# Patient Record
Sex: Male | Born: 1950
Health system: Southern US, Community
[De-identification: ages and names within clinical notes are randomized; demographics above are authoritative.]

## PROBLEM LIST (undated history)

## (undated) DIAGNOSIS — Z720 Tobacco use: Secondary | ICD-10-CM

## (undated) DIAGNOSIS — I10 Essential (primary) hypertension: Secondary | ICD-10-CM

## (undated) DIAGNOSIS — C801 Malignant (primary) neoplasm, unspecified: Secondary | ICD-10-CM

## (undated) DIAGNOSIS — J45909 Unspecified asthma, uncomplicated: Secondary | ICD-10-CM

## (undated) DIAGNOSIS — J449 Chronic obstructive pulmonary disease, unspecified: Secondary | ICD-10-CM

## (undated) DIAGNOSIS — G4700 Insomnia, unspecified: Secondary | ICD-10-CM

## (undated) DIAGNOSIS — E079 Disorder of thyroid, unspecified: Secondary | ICD-10-CM

## (undated) HISTORY — DX: Tobacco use: Z72.0

## (undated) HISTORY — DX: Insomnia, unspecified: G47.00

## (undated) HISTORY — DX: Essential (primary) hypertension: I10

## (undated) HISTORY — DX: Unspecified asthma, uncomplicated: J45.909

## (undated) HISTORY — PX: OTHER SURGICAL HISTORY: SHX169

---

## 2005-01-18 ENCOUNTER — Emergency Department (HOSPITAL_COMMUNITY): Admission: EM | Admit: 2005-01-18 | Discharge: 2005-01-18 | Payer: Self-pay | Admitting: Emergency Medicine

## 2013-10-11 HISTORY — PX: THYROIDECTOMY: SHX17

## 2015-10-14 DIAGNOSIS — F17200 Nicotine dependence, unspecified, uncomplicated: Secondary | ICD-10-CM | POA: Diagnosis not present

## 2015-10-14 DIAGNOSIS — I10 Essential (primary) hypertension: Secondary | ICD-10-CM | POA: Diagnosis not present

## 2015-10-14 DIAGNOSIS — R609 Edema, unspecified: Secondary | ICD-10-CM | POA: Diagnosis not present

## 2015-10-14 DIAGNOSIS — J309 Allergic rhinitis, unspecified: Secondary | ICD-10-CM | POA: Diagnosis not present

## 2015-11-20 DIAGNOSIS — J309 Allergic rhinitis, unspecified: Secondary | ICD-10-CM | POA: Diagnosis not present

## 2015-11-20 DIAGNOSIS — I10 Essential (primary) hypertension: Secondary | ICD-10-CM | POA: Diagnosis not present

## 2015-11-20 DIAGNOSIS — R609 Edema, unspecified: Secondary | ICD-10-CM | POA: Diagnosis not present

## 2015-11-20 DIAGNOSIS — F17200 Nicotine dependence, unspecified, uncomplicated: Secondary | ICD-10-CM | POA: Diagnosis not present

## 2015-12-22 DIAGNOSIS — E039 Hypothyroidism, unspecified: Secondary | ICD-10-CM | POA: Diagnosis not present

## 2015-12-22 DIAGNOSIS — Z85850 Personal history of malignant neoplasm of thyroid: Secondary | ICD-10-CM | POA: Diagnosis not present

## 2016-01-21 DIAGNOSIS — J309 Allergic rhinitis, unspecified: Secondary | ICD-10-CM | POA: Diagnosis not present

## 2016-02-09 DIAGNOSIS — G4700 Insomnia, unspecified: Secondary | ICD-10-CM | POA: Diagnosis not present

## 2016-02-09 DIAGNOSIS — I10 Essential (primary) hypertension: Secondary | ICD-10-CM | POA: Diagnosis not present

## 2016-02-09 DIAGNOSIS — F17200 Nicotine dependence, unspecified, uncomplicated: Secondary | ICD-10-CM | POA: Diagnosis not present

## 2016-03-24 DIAGNOSIS — E039 Hypothyroidism, unspecified: Secondary | ICD-10-CM | POA: Diagnosis not present

## 2016-03-24 DIAGNOSIS — Z85850 Personal history of malignant neoplasm of thyroid: Secondary | ICD-10-CM | POA: Diagnosis not present

## 2016-04-14 DIAGNOSIS — Z6832 Body mass index (BMI) 32.0-32.9, adult: Secondary | ICD-10-CM | POA: Diagnosis not present

## 2016-04-14 DIAGNOSIS — R079 Chest pain, unspecified: Secondary | ICD-10-CM | POA: Diagnosis not present

## 2016-04-14 DIAGNOSIS — F17200 Nicotine dependence, unspecified, uncomplicated: Secondary | ICD-10-CM | POA: Diagnosis not present

## 2016-04-14 DIAGNOSIS — F329 Major depressive disorder, single episode, unspecified: Secondary | ICD-10-CM | POA: Diagnosis not present

## 2016-04-14 DIAGNOSIS — F419 Anxiety disorder, unspecified: Secondary | ICD-10-CM | POA: Diagnosis not present

## 2016-05-27 DIAGNOSIS — F419 Anxiety disorder, unspecified: Secondary | ICD-10-CM | POA: Diagnosis not present

## 2016-05-27 DIAGNOSIS — Z6832 Body mass index (BMI) 32.0-32.9, adult: Secondary | ICD-10-CM | POA: Diagnosis not present

## 2016-05-27 DIAGNOSIS — R079 Chest pain, unspecified: Secondary | ICD-10-CM | POA: Diagnosis not present

## 2016-05-27 DIAGNOSIS — F329 Major depressive disorder, single episode, unspecified: Secondary | ICD-10-CM | POA: Diagnosis not present

## 2016-05-31 DIAGNOSIS — R5383 Other fatigue: Secondary | ICD-10-CM | POA: Diagnosis not present

## 2016-05-31 DIAGNOSIS — Z7984 Long term (current) use of oral hypoglycemic drugs: Secondary | ICD-10-CM | POA: Diagnosis not present

## 2016-05-31 DIAGNOSIS — R079 Chest pain, unspecified: Secondary | ICD-10-CM | POA: Diagnosis not present

## 2016-05-31 DIAGNOSIS — Z125 Encounter for screening for malignant neoplasm of prostate: Secondary | ICD-10-CM | POA: Diagnosis not present

## 2016-05-31 DIAGNOSIS — J309 Allergic rhinitis, unspecified: Secondary | ICD-10-CM | POA: Diagnosis not present

## 2016-06-21 DIAGNOSIS — Z85850 Personal history of malignant neoplasm of thyroid: Secondary | ICD-10-CM | POA: Diagnosis not present

## 2016-06-21 DIAGNOSIS — E039 Hypothyroidism, unspecified: Secondary | ICD-10-CM | POA: Diagnosis not present

## 2016-06-23 ENCOUNTER — Encounter: Payer: Self-pay | Admitting: *Deleted

## 2016-06-24 ENCOUNTER — Encounter: Payer: Self-pay | Admitting: *Deleted

## 2016-06-24 ENCOUNTER — Other Ambulatory Visit: Payer: Self-pay | Admitting: *Deleted

## 2016-06-24 ENCOUNTER — Encounter: Payer: Self-pay | Admitting: Cardiovascular Disease

## 2016-06-24 ENCOUNTER — Ambulatory Visit (INDEPENDENT_AMBULATORY_CARE_PROVIDER_SITE_OTHER): Payer: PPO | Admitting: Cardiovascular Disease

## 2016-06-24 VITALS — BP 160/83 | HR 83 | Ht 70.5 in | Wt 228.0 lb

## 2016-06-24 DIAGNOSIS — R0989 Other specified symptoms and signs involving the circulatory and respiratory systems: Secondary | ICD-10-CM

## 2016-06-24 DIAGNOSIS — R072 Precordial pain: Secondary | ICD-10-CM | POA: Diagnosis not present

## 2016-06-24 DIAGNOSIS — I10 Essential (primary) hypertension: Secondary | ICD-10-CM | POA: Diagnosis not present

## 2016-06-24 DIAGNOSIS — Z720 Tobacco use: Secondary | ICD-10-CM | POA: Diagnosis not present

## 2016-06-24 MED ORDER — NITROGLYCERIN 0.4 MG SL SUBL
0.4000 mg | SUBLINGUAL_TABLET | SUBLINGUAL | 3 refills | Status: DC | PRN
Start: 2016-06-24 — End: 2018-08-16

## 2016-06-24 MED ORDER — ASPIRIN EC 81 MG PO TBEC: 81.0000 mg | DELAYED_RELEASE_TABLET | Freq: Every day | ORAL | Status: DC

## 2016-06-24 NOTE — Addendum Note (Signed)
Addended by: Laurine Blazer on: 06/24/2016 02:11 PM   Modules accepted: Orders

## 2016-06-24 NOTE — Progress Notes (Signed)
CARDIOLOGY CONSULT NOTE  Patient ID: Jose Burch MRN: [id number] DOB/AGE: 66-Nov-1952 66 y.o.  Admit date: (Not on file) Primary Physician: Andres Shad, MD Referring Physician:   Reason for Consultation: chest pain  HPI: 65 year old male with history of hypertension referred for the evaluation of chest pain. PCP notes describe pain relieved with lorazepam.  He is a former Administrator. He has a history of tobacco abuse. He has been experiencing intermittent chest tightness for the past 2 years. It is made worse with exertion. There is mild associated shortness of breath. He checks his blood pressure at home and said it is elevated when he has chest tightness. He has had episodes of near-syncope in the past. He said by the end of the day "my whole body is swollen including my legs and feet". Denies orthopnea and PND.  ECG performed in the office today which I personally interpreted showed normal sinus rhythm with right axis deviation.  No Known Allergies  Current Outpatient Prescriptions  Medication Sig Dispense Refill  . amLODipine (NORVASC) 10 MG tablet Take 10 mg by mouth daily.    . calcitRIOL (ROCALTROL) 0.5 MCG capsule Take 0.5 mcg by mouth daily.    . furosemide (LASIX) 20 MG tablet Take 20 mg by mouth as needed.    Marland Kitchen KLOR-CON 10 10 MEQ tablet     . levothyroxine (SYNTHROID, LEVOTHROID) 175 MCG tablet Take 175 mcg by mouth daily before breakfast.    . LORazepam (ATIVAN) 2 MG tablet     . traMADol (ULTRAM) 50 MG tablet Take 50 mg by mouth every 6 (six) hours as needed.     No current facility-administered medications for this visit.     Past Medical History:  Diagnosis Date  . Hypertension   . Insomnia   . Tobacco abuse     Past Surgical History:  Procedure Laterality Date  . THYROIDECTOMY  2015    Social History   Social History  . Marital status: Married    Spouse name: N/A  . Number of children: N/A  . Years of education: N/A    Occupational History  . Not on file.   Social History Main Topics  . Smoking status: Current Some Day Smoker    Packs/day: 0.25    Types: Cigarettes  . Smokeless tobacco: Never Used  . Alcohol use No  . Drug use: No  . Sexual activity: No   Other Topics Concern  . Not on file   Social History Narrative  . No narrative on file     No family history of premature CAD in 1st degree relatives.  Prior to Admission medications   Medication Sig Start Date End Date Taking? Authorizing Provider  amitriptyline (ELAVIL) 75 MG tablet Take 75 mg by mouth at bedtime.    Historical Provider, MD  amLODipine (NORVASC) 10 MG tablet Take 10 mg by mouth daily.    Historical Provider, MD  busPIRone (BUSPAR) 10 MG tablet Take 10 mg by mouth 2 (two) times daily.    Historical Provider, MD  escitalopram (LEXAPRO) 10 MG tablet Take 10 mg by mouth daily.    Historical Provider, MD  furosemide (LASIX) 20 MG tablet Take 20 mg by mouth as needed.    Historical Provider, MD  hydrALAZINE (APRESOLINE) 50 MG tablet Take 50 mg by mouth 3 (three) times daily.    Historical Provider, MD  zolpidem (AMBIEN CR) 12.5 MG CR tablet Take 12.5 mg by mouth at  bedtime.    Historical Provider, MD     Review of systems complete and found to be negative unless listed above in HPI     Physical exam Blood pressure (!) 160/83, pulse 83, height 5' 10.5" (1.791 m), weight 228 lb (103.4 kg), SpO2 94 %. General: NAD Neck: No JVD, no thyromegaly or thyroid nodule.  Lungs: Bibasilar crackles. CV: Nondisplaced PMI. Regular rate and rhythm, normal S1/S2, no S3/S4, no murmur.  No peripheral edema.  No carotid bruit.    Abdomen: Soft, nontender, no hepatosplenomegaly, no distention. Obese. Skin: Intact without lesions or rashes.  Neurologic: Alert and oriented x 3.  Psych: Normal affect. Extremities: No clubbing or cyanosis.  HEENT: Normal.   ECG: Most recent ECG reviewed.  Labs:  No results found for: WBC, HGB, HCT,  MCV, PLT No results for input(s): NA, K, CL, CO2, BUN, CREATININE, CALCIUM, PROT, BILITOT, ALKPHOS, ALT, AST, GLUCOSE in the last 168 hours.  Invalid input(s): LABALBU No results found for: CKTOTAL, CKMB, CKMBINDEX, TROPONINI No results found for: CHOL No results found for: HDL No results found for: LDLCALC No results found for: TRIG No results found for: CHOLHDL No results found for: LDLDIRECT       Studies: No results found.  ASSESSMENT AND PLAN:  1. Chest pain: Given CV risk factors, symptoms concerning for ischemic heart disease. I will proceed with a nuclear myocardial perfusion imaging study to evaluate for ischemic heart disease (Lexiscan). I will order a 2-D echocardiogram with Doppler to evaluate cardiac structure, function, and regional wall motion. Will start ASA 81 mg and SL nitro prn.  2. Lung crackles: May be scar from tobacco abuse, but CHF cannot be ruled out. Will obtain chest xray and echocardiogram.  3. HTN: Elevated. Will monitor. May need additional meds.  Dispo: fu 1 month.    Signed: Kate Sable, M.D., F.A.C.C.  06/24/2016, 8:56 AM

## 2016-06-24 NOTE — Patient Instructions (Addendum)
Medication Instructions:   Begin Aspirin 81mg  daily.  Begin Nitroglycerin as needed for severe chest pain only.  Continue all other medications.    Labwork: none  Testing/Procedures:  A chest x-ray takes a picture of the organs and structures inside the chest, including the heart, lungs, and blood vessels. This test can show several things, including, whether the heart is enlarges; whether fluid is building up in the lungs; and whether pacemaker / defibrillator leads are still in place.  Your physician has requested that you have a lexiscan myoview. For further information please visit [email or web address]. Please follow instruction sheet, as given.  Your physician has requested that you have an echocardiogram. Echocardiography is a painless test that uses sound waves to create images of your heart. It provides your doctor with information about the size and shape of your heart and how well your heart's chambers and valves are working. This procedure takes approximately one hour. There are no restrictions for this procedure.  Office will contact with results via phone or letter.    Follow-Up: 1 month  Any Other Special Instructions Will Be Listed Below (If Applicable).  If you need a refill on your cardiac medications before your next appointment, please call your pharmacy.

## 2016-06-24 NOTE — Addendum Note (Signed)
Addended by: Laurine Blazer on: 06/24/2016 09:19 AM   Modules accepted: Orders

## 2016-06-25 ENCOUNTER — Ambulatory Visit (HOSPITAL_COMMUNITY)
Admission: RE | Admit: 2016-06-25 | Discharge: 2016-06-25 | Disposition: A | Discharge status: Home / Self Care | Payer: PPO | Source: Ambulatory Visit | Attending: Cardiovascular Disease | Admitting: Cardiovascular Disease

## 2016-06-25 DIAGNOSIS — R0989 Other specified symptoms and signs involving the circulatory and respiratory systems: Secondary | ICD-10-CM | POA: Insufficient documentation

## 2016-06-25 DIAGNOSIS — R072 Precordial pain: Secondary | ICD-10-CM | POA: Diagnosis not present

## 2016-06-25 DIAGNOSIS — R0789 Other chest pain: Secondary | ICD-10-CM | POA: Diagnosis not present

## 2016-06-29 ENCOUNTER — Encounter (HOSPITAL_COMMUNITY): Payer: Self-pay

## 2016-06-29 ENCOUNTER — Encounter (HOSPITAL_COMMUNITY)
Admission: RE | Admit: 2016-06-29 | Discharge: 2016-06-29 | Disposition: A | Discharge status: Home / Self Care | Payer: PPO | Source: Ambulatory Visit | Attending: Cardiovascular Disease | Admitting: Cardiovascular Disease

## 2016-06-29 ENCOUNTER — Inpatient Hospital Stay (HOSPITAL_COMMUNITY): Admission: RE | Admit: 2016-06-29 | Payer: PPO | Source: Ambulatory Visit

## 2016-06-29 DIAGNOSIS — R072 Precordial pain: Secondary | ICD-10-CM | POA: Insufficient documentation

## 2016-06-29 HISTORY — DX: Malignant (primary) neoplasm, unspecified: C80.1

## 2016-06-29 LAB — NM MYOCAR MULTI W/SPECT W/WALL MOTION / EF
CHL CUP NUCLEAR SDS: 0
CHL CUP NUCLEAR SRS: 3
CHL CUP RESTING HR STRESS: 78 {beats}/min
CSEPPHR: 96 {beats}/min
LV dias vol: 84 mL (ref 62–150)
LV sys vol: 23 mL
RATE: 0.3
SSS: 3
TID: 0.95

## 2016-06-29 MED ORDER — TECHNETIUM TC 99M TETROFOSMIN IV KIT
30.0000 | PACK | Freq: Once | INTRAVENOUS | Status: AC | PRN
  Administered 2016-06-29: 28.5 via INTRAVENOUS

## 2016-06-29 MED ORDER — SODIUM CHLORIDE 0.9% FLUSH
INTRAVENOUS | Status: AC
  Administered 2016-06-29: 10 mL via INTRAVENOUS
  Filled 2016-06-29: qty 10

## 2016-06-29 MED ORDER — TECHNETIUM TC 99M TETROFOSMIN IV KIT
10.0000 | PACK | Freq: Once | INTRAVENOUS | Status: AC | PRN
  Administered 2016-06-29: 10.9 via INTRAVENOUS

## 2016-06-29 MED ORDER — TECHNETIUM TC 99M TETROFOSMIN IV KIT: 30.0000 | PACK | Freq: Once | INTRAVENOUS | Status: DC | PRN

## 2016-06-29 MED ORDER — TECHNETIUM TC 99M TETROFOSMIN IV KIT: 10.0000 | PACK | Freq: Once | INTRAVENOUS | Status: DC | PRN

## 2016-06-29 MED ORDER — REGADENOSON 0.4 MG/5ML IV SOLN
INTRAVENOUS | Status: AC
  Administered 2016-06-29: 0.4 mg via INTRAVENOUS
  Filled 2016-06-29: qty 5

## 2016-06-30 ENCOUNTER — Telehealth: Payer: Self-pay | Admitting: Cardiovascular Disease

## 2016-06-30 NOTE — Telephone Encounter (Signed)
Notes Recorded by Laurine Blazer, LPN on 624THL at 075-GRM AM EDT Patient notified and verbalized understanding. Copy to pmd. Follow up scheduled for 07/23/2016 with Dr. Bronson Ing. ------  Notes Recorded by Herminio Commons, MD on 06/25/2016 at 10:46 AM EDT Normal.

## 2016-06-30 NOTE — Telephone Encounter (Signed)
Jose Burch called wanting to get test results for recent chest xray.

## 2016-07-01 ENCOUNTER — Ambulatory Visit (HOSPITAL_COMMUNITY)
Admission: RE | Admit: 2016-07-01 | Discharge: 2016-07-01 | Disposition: A | Discharge status: Home / Self Care | Payer: PPO | Source: Ambulatory Visit | Attending: Cardiovascular Disease | Admitting: Cardiovascular Disease

## 2016-07-01 DIAGNOSIS — I517 Cardiomegaly: Secondary | ICD-10-CM | POA: Diagnosis not present

## 2016-07-01 DIAGNOSIS — R079 Chest pain, unspecified: Secondary | ICD-10-CM | POA: Diagnosis not present

## 2016-07-01 DIAGNOSIS — R072 Precordial pain: Secondary | ICD-10-CM | POA: Diagnosis not present

## 2016-07-01 DIAGNOSIS — I340 Nonrheumatic mitral (valve) insufficiency: Secondary | ICD-10-CM | POA: Diagnosis not present

## 2016-07-01 NOTE — Progress Notes (Signed)
*  PRELIMINARY RESULTS* Echocardiogram 2D Echocardiogram has been performed.  Jose Burch 07/01/2016, 9:23 AM

## 2016-07-12 DIAGNOSIS — Z23 Encounter for immunization: Secondary | ICD-10-CM | POA: Diagnosis not present

## 2016-07-12 DIAGNOSIS — R0600 Dyspnea, unspecified: Secondary | ICD-10-CM | POA: Diagnosis not present

## 2016-07-12 DIAGNOSIS — Z6839 Body mass index (BMI) 39.0-39.9, adult: Secondary | ICD-10-CM | POA: Diagnosis not present

## 2016-07-12 DIAGNOSIS — R079 Chest pain, unspecified: Secondary | ICD-10-CM | POA: Diagnosis not present

## 2016-07-23 ENCOUNTER — Encounter: Payer: Self-pay | Admitting: Cardiovascular Disease

## 2016-07-23 ENCOUNTER — Ambulatory Visit (INDEPENDENT_AMBULATORY_CARE_PROVIDER_SITE_OTHER): Payer: PPO | Admitting: Cardiovascular Disease

## 2016-07-23 VITALS — BP 149/79 | HR 98 | Ht 70.5 in | Wt 226.2 lb

## 2016-07-23 DIAGNOSIS — R0989 Other specified symptoms and signs involving the circulatory and respiratory systems: Secondary | ICD-10-CM | POA: Diagnosis not present

## 2016-07-23 DIAGNOSIS — I10 Essential (primary) hypertension: Secondary | ICD-10-CM

## 2016-07-23 DIAGNOSIS — Z720 Tobacco use: Secondary | ICD-10-CM

## 2016-07-23 DIAGNOSIS — R072 Precordial pain: Secondary | ICD-10-CM

## 2016-07-23 MED ORDER — VALSARTAN 80 MG PO TABS: 80.0000 mg | ORAL_TABLET | Freq: Every day | ORAL | 6 refills | Status: DC

## 2016-07-23 NOTE — Progress Notes (Signed)
SUBJECTIVE: The patient returns for follow-up after undergoing cardiovascular testing performed for the evaluation of chest pain.  Chest x-ray showed no active pulmonary pulmonary disease.  Echocardiogram showed normal left ventricular systolic function, LVEF 123456, normal regional wall motion, moderate LVH, grade 1 diastolic dysfunction, mild mitral regurgitation.  Nuclear stress test showed no evidence of myocardial ischemia or scar and was deemed a low risk study.  Has had 2 episodes of chest pain relieved with nitroglycerin.   Review of Systems: As per "subjective", otherwise negative.  No Known Allergies  Current Outpatient Prescriptions  Medication Sig Dispense Refill  . amLODipine (NORVASC) 10 MG tablet Take 10 mg by mouth daily.    Marland Kitchen aspirin EC 81 MG tablet Take 1 tablet (81 mg total) by mouth daily.    . calcitRIOL (ROCALTROL) 0.5 MCG capsule Take 0.5 mcg by mouth daily.    . furosemide (LASIX) 40 MG tablet Take 40 mg by mouth daily.    Marland Kitchen KLOR-CON 10 10 MEQ tablet Take 10 mEq by mouth daily.     Marland Kitchen levothyroxine (SYNTHROID, LEVOTHROID) 175 MCG tablet Take 175 mcg by mouth daily before breakfast.    . LORazepam (ATIVAN) 1 MG tablet Take 1 mg by mouth at bedtime.    . nitroGLYCERIN (NITROSTAT) 0.4 MG SL tablet Place 1 tablet (0.4 mg total) under the tongue every 5 (five) minutes as needed for chest pain. 25 tablet 3  . traMADol (ULTRAM) 50 MG tablet Take 50 mg by mouth every 6 (six) hours as needed.     No current facility-administered medications for this visit.     Past Medical History:  Diagnosis Date  . Cancer ([place])    Thyroid  . Hypertension   . Insomnia   . Tobacco abuse     Past Surgical History:  Procedure Laterality Date  . THYROIDECTOMY  2015    Social History   Social History  . Marital status: Married    Spouse name: N/A  . Number of children: N/A  . Years of education: N/A   Occupational History  . Not on file.   Social History Main  Topics  . Smoking status: Current Some Day Smoker    Packs/day: 0.25    Types: Cigarettes  . Smokeless tobacco: Never Used  . Alcohol use No  . Drug use: No  . Sexual activity: No   Other Topics Concern  . Not on file   Social History Narrative  . No narrative on file     Vitals:   07/23/16 1101  BP: (!) 149/79  Pulse: 98  SpO2: 95%  Weight: 226 lb 3.2 oz (102.6 kg)  Height: 5' 10.5" (1.791 m)    PHYSICAL EXAM General: NAD HEENT: Normal. Neck: No JVD, no thyromegaly. Lungs: Clear to auscultation bilaterally with normal respiratory effort. CV: Nondisplaced PMI.  Regular rate and rhythm, normal S1/S2, no S3/S4, no murmur. No pretibial or periankle edema.   Abdomen: Firm, obese. Neurologic: Alert and oriented.  Psych: Normal affect. Skin: Normal. Musculoskeletal: No gross deformities.    ECG: Most recent ECG reviewed.      ASSESSMENT AND PLAN: 1. Chest pain: Symptoms are stable. Will aim to control BP. Continue ASA 81 mg and SL nitro prn. No ischemia or scar on nuclear stress testing. Normal LV systolic function. No further testing indicated.  2. Lung crackles: No evidence of CHF. Likely has atelectasis.  3. HTN: Elevated. Creatinine 0.92 on 06/01/16. Will start valsartan 80 mg  daily.  Dispo: fu 3 months.   Kate Sable, M.D., F.A.C.C.

## 2016-07-23 NOTE — Addendum Note (Signed)
Addended by: Laurine Blazer on: 07/23/2016 11:24 AM   Modules accepted: Orders

## 2016-07-23 NOTE — Patient Instructions (Signed)
Medication Instructions:   Begin Valsartan 80mg  daily.  Continue all other medications.    Labwork: none  Testing/Procedures: none  Follow-Up: 3 months   Any Other Special Instructions Will Be Listed Below (If Applicable).  If you need a refill on your cardiac medications before your next appointment, please call your pharmacy.

## 2016-07-27 DIAGNOSIS — I10 Essential (primary) hypertension: Secondary | ICD-10-CM | POA: Diagnosis not present

## 2016-07-27 DIAGNOSIS — Z131 Encounter for screening for diabetes mellitus: Secondary | ICD-10-CM | POA: Diagnosis not present

## 2016-07-27 DIAGNOSIS — E784 Other hyperlipidemia: Secondary | ICD-10-CM | POA: Diagnosis not present

## 2016-07-27 DIAGNOSIS — E119 Type 2 diabetes mellitus without complications: Secondary | ICD-10-CM | POA: Diagnosis not present

## 2016-07-27 DIAGNOSIS — R609 Edema, unspecified: Secondary | ICD-10-CM | POA: Diagnosis not present

## 2016-09-20 DIAGNOSIS — E039 Hypothyroidism, unspecified: Secondary | ICD-10-CM | POA: Diagnosis not present

## 2016-09-20 DIAGNOSIS — G4730 Sleep apnea, unspecified: Secondary | ICD-10-CM | POA: Diagnosis not present

## 2016-09-22 DIAGNOSIS — E039 Hypothyroidism, unspecified: Secondary | ICD-10-CM | POA: Diagnosis not present

## 2016-10-15 ENCOUNTER — Ambulatory Visit: Payer: PPO | Admitting: Cardiovascular Disease

## 2016-11-16 ENCOUNTER — Encounter: Payer: Self-pay | Admitting: Cardiovascular Disease

## 2016-11-16 ENCOUNTER — Ambulatory Visit (INDEPENDENT_AMBULATORY_CARE_PROVIDER_SITE_OTHER): Payer: PPO | Admitting: Cardiovascular Disease

## 2016-11-16 VITALS — BP 150/78 | HR 75 | Ht 70.5 in | Wt 220.0 lb

## 2016-11-16 DIAGNOSIS — Z720 Tobacco use: Secondary | ICD-10-CM

## 2016-11-16 DIAGNOSIS — I10 Essential (primary) hypertension: Secondary | ICD-10-CM | POA: Diagnosis not present

## 2016-11-16 DIAGNOSIS — R072 Precordial pain: Secondary | ICD-10-CM

## 2016-11-16 NOTE — Patient Instructions (Signed)

## 2016-11-16 NOTE — Progress Notes (Signed)
SUBJECTIVE: The patient presents for follow-up of chest pain.  Echocardiogram in 06/2016 showed normal left ventricular systolic function, LVEF 123456, normal regional wall motion, moderate LVH, grade 1 diastolic dysfunction, mild mitral regurgitation.  Nuclear stress test in 06/2016 showed no evidence of myocardial ischemia or scar and was deemed a low risk study.  He has had one episode of chest pain since his last visit with me requiring nitroglycerin. He has cut back from smoking 2 packs of cigarettes daily to one pack per week. He has had an upper respiratory infection for the past 10 days with a cough and chills. He has had occasional left-sided neck pressure. He is scheduled to see his PCP on Feb 12.   Review of Systems: As per "subjective", otherwise negative.  No Known Allergies  Current Outpatient Prescriptions  Medication Sig Dispense Refill  . amLODipine (NORVASC) 10 MG tablet Take 10 mg by mouth daily.    Marland Kitchen aspirin EC 81 MG tablet Take 1 tablet (81 mg total) by mouth daily.    . calcitRIOL (ROCALTROL) 0.5 MCG capsule Take 0.5 mcg by mouth daily.    . furosemide (LASIX) 40 MG tablet Take 40 mg by mouth daily.    Marland Kitchen KLOR-CON 10 10 MEQ tablet Take 10 mEq by mouth daily.     Marland Kitchen levothyroxine (SYNTHROID, LEVOTHROID) 175 MCG tablet Take 175 mcg by mouth daily before breakfast.    . LORazepam (ATIVAN) 1 MG tablet Take 1 mg by mouth at bedtime.    . nitroGLYCERIN (NITROSTAT) 0.4 MG SL tablet Place 1 tablet (0.4 mg total) under the tongue every 5 (five) minutes as needed for chest pain. 25 tablet 3  . traMADol (ULTRAM) 50 MG tablet Take 50 mg by mouth every 6 (six) hours as needed.    . valsartan (DIOVAN) 80 MG tablet Take 1 tablet (80 mg total) by mouth daily. 30 tablet 6   No current facility-administered medications for this visit.     Past Medical History:  Diagnosis Date  . Cancer ([place])    Thyroid  . Hypertension   . Insomnia   . Tobacco abuse     Past Surgical  History:  Procedure Laterality Date  . THYROIDECTOMY  2015    Social History   Social History  . Marital status: Married    Spouse name: N/A  . Number of children: N/A  . Years of education: N/A   Occupational History  . Not on file.   Social History Main Topics  . Smoking status: Current Some Day Smoker    Packs/day: 0.25    Types: Cigarettes  . Smokeless tobacco: Never Used  . Alcohol use No  . Drug use: No  . Sexual activity: No   Other Topics Concern  . Not on file   Social History Narrative  . No narrative on file     Vitals:   11/16/16 1258  BP: (!) 150/78  Pulse: 75  SpO2: 95%  Weight: 220 lb (99.8 kg)  Height: 5' 10.5" (1.791 m)    PHYSICAL EXAM General: NAD, wearing a respiratory mask. HEENT: Normal. Neck: No JVD, no thyromegaly. Lungs: Clear to auscultation bilaterally with normal respiratory effort. CV: Nondisplaced PMI.  Regular rate and rhythm, normal S1/S2, no S3/S4, no murmur. No pretibial or periankle edema.  .   Abdomen: Soft, nontender, no distention.  Neurologic: Alert and oriented.  Psych: Normal affect. Skin: Normal. Musculoskeletal: No gross deformities.    ECG: Most recent  ECG reviewed.      ASSESSMENT AND PLAN: 1. Chest pain: Symptoms are stable. Will aim to control BP. Continue ASA 81 mg and SL nitro prn. No ischemia or scar on nuclear stress testing. Normal LV systolic function. No further testing indicated.  2. Lung crackles: No evidence of CHF. Likely has atelectasis.  3. HTN: Elevated today but he is struggling with an upper respiratory infection. If elevated at next visit, will increase valsartan to 160 mg daily.  Dispo: fu 6 months.   Kate Sable, M.D., F.A.C.C.

## 2016-11-22 DIAGNOSIS — Z6831 Body mass index (BMI) 31.0-31.9, adult: Secondary | ICD-10-CM | POA: Diagnosis not present

## 2016-11-22 DIAGNOSIS — R05 Cough: Secondary | ICD-10-CM | POA: Diagnosis not present

## 2016-12-02 ENCOUNTER — Ambulatory Visit (INDEPENDENT_AMBULATORY_CARE_PROVIDER_SITE_OTHER): Payer: PPO | Admitting: Otolaryngology

## 2016-12-02 DIAGNOSIS — Z85850 Personal history of malignant neoplasm of thyroid: Secondary | ICD-10-CM

## 2016-12-02 DIAGNOSIS — E039 Hypothyroidism, unspecified: Secondary | ICD-10-CM | POA: Diagnosis not present

## 2016-12-08 ENCOUNTER — Other Ambulatory Visit: Payer: Self-pay | Admitting: *Deleted

## 2016-12-08 MED ORDER — VALSARTAN 80 MG PO TABS: 80.0000 mg | ORAL_TABLET | Freq: Every day | ORAL | 3 refills | Status: DC

## 2017-01-12 DIAGNOSIS — Z79899 Other long term (current) drug therapy: Secondary | ICD-10-CM | POA: Diagnosis not present

## 2017-01-13 ENCOUNTER — Encounter: Payer: Self-pay | Admitting: "Endocrinology

## 2017-01-13 ENCOUNTER — Ambulatory Visit (INDEPENDENT_AMBULATORY_CARE_PROVIDER_SITE_OTHER): Payer: PPO | Admitting: "Endocrinology

## 2017-01-13 VITALS — BP 139/82 | HR 93 | Ht 70.5 in | Wt 215.0 lb

## 2017-01-13 DIAGNOSIS — C73 Malignant neoplasm of thyroid gland: Secondary | ICD-10-CM | POA: Diagnosis not present

## 2017-01-13 DIAGNOSIS — E890 Postprocedural hypothyroidism: Secondary | ICD-10-CM | POA: Diagnosis not present

## 2017-01-13 LAB — T4, FREE: Free T4: 1.7 ng/dL (ref 0.8–1.8)

## 2017-01-13 LAB — T3, FREE: T3, Free: 3.3 pg/mL (ref 2.3–4.2)

## 2017-01-13 LAB — TSH: TSH: 0.38 m[IU]/L — AB (ref 0.40–4.50)

## 2017-01-13 NOTE — Progress Notes (Signed)
Subjective:    Patient ID: Jose Burch, male    DOB: [birth year], PCP Andres Shad, MD   Past Medical History:  Diagnosis Date  . Cancer ([place])    Thyroid  . Hypertension   . Insomnia   . Tobacco abuse    Past Surgical History:  Procedure Laterality Date  . THYROIDECTOMY  2015   Social History   Social History  . Marital status: Married    Spouse name: N/A  . Number of children: N/A  . Years of education: N/A   Social History Main Topics  . Smoking status: Current Some Day Smoker    Packs/day: 0.25    Types: Cigarettes  . Smokeless tobacco: Never Used  . Alcohol use No  . Drug use: No  . Sexual activity: No   Other Topics Concern  . None   Social History Narrative  . None   Outpatient Encounter Prescriptions as of 01/13/2017  Medication Sig  . amLODipine (NORVASC) 10 MG tablet Take 10 mg by mouth daily.  Marland Kitchen aspirin EC 81 MG tablet Take 1 tablet (81 mg total) by mouth daily.  . calcitRIOL (ROCALTROL) 0.5 MCG capsule Take 0.5 mcg by mouth daily.  . furosemide (LASIX) 40 MG tablet Take 40 mg by mouth daily.  Marland Kitchen KLOR-CON 10 10 MEQ tablet Take 10 mEq by mouth daily.   Marland Kitchen levothyroxine (SYNTHROID, LEVOTHROID) 175 MCG tablet Take 175 mcg by mouth daily before breakfast.  . LORazepam (ATIVAN) 1 MG tablet Take 1 mg by mouth at bedtime.  . nitroGLYCERIN (NITROSTAT) 0.4 MG SL tablet Place 1 tablet (0.4 mg total) under the tongue every 5 (five) minutes as needed for chest pain.  . traMADol (ULTRAM) 50 MG tablet Take 50 mg by mouth every 6 (six) hours as needed.  . valsartan (DIOVAN) 80 MG tablet Take 1 tablet (80 mg total) by mouth daily.   No facility-administered encounter medications on file as of 01/13/2017.    ALLERGIES: No Known Allergies  VACCINATION STATUS:  There is no immunization history on file for this patient.  HPI 67 year old man with medical history as above. He is being seen in consultation for history of papillary thyroid cancer status post  total thyroidectomy in 02/09/2014 followed by remnant ablation with I-131. -Records of his treatment are not available to review today. He was recently referred to Dr. Benjamine Mola given this history. Dr. Benjamine Mola suggested that he follows with endocrinologist in town. He does not have recent thyroid function tests. He is on levothyroxine 175 g by mouth every morning. He is compliant with this medication. Per his recollection, he did not have any further surveillance imaging studies after his initial treatment for thyroid cancer. -He denies dysphagia, shortness of breath, voice change. He is on calcitriol 0.5 g 3 times a day since the time of his surgery. He reports that surgery was relatively difficult taking  6 hours to complete. He denies family history of thyroid cancer nor any thyroid dysfunction. He is a chronic active smoker for the last 15 years. He has hypertension on treatment. -He complains of fatigue, intermittent wheezing.  Review of Systems  Constitutional: Steady body weight , + fatigue, no subjective hyperthermia, no subjective hypothermia Eyes: no blurry vision, no xerophthalmia ENT: no sore throat, no nodules palpated in throat, no dysphagia/odynophagia, no hoarseness Cardiovascular: no Chest Pain, no Shortness of Breath, no palpitations, no leg swelling Respiratory: + cough, no SOB Gastrointestinal: no Nausea/Vomiting/Diarhhea Musculoskeletal: no muscle/joint aches Skin: no rashes  Neurological: no tremors, no numbness, no tingling, no dizziness Psychiatric: no depression, no anxiety  Objective:    BP 139/82   Pulse 93   Ht 5' 10.5" (1.791 m)   Wt 215 lb (97.5 kg)   BMI 30.41 kg/m   Wt Readings from Last 3 Encounters:  01/13/17 215 lb (97.5 kg)  11/16/16 220 lb (99.8 kg)  07/23/16 226 lb 3.2 oz (102.6 kg)    Physical Exam  Constitutional: Significantly over weight for hight, not in acute distress, normal state of mind Eyes: PERRLA, EOMI, no exophthalmos ENT: moist mucous  membranes, + long horizontal lower neck surgical scar from prior total thyroidectomy/neck dissection , no cervical lymphadenopathy Cardiovascular: normal precordial activity, Regular Rate and Rhythm, no Murmur/Rubs/Gallops Respiratory:  + Scattered wheezes on all lung fields,  no gross chest deformity. Gastrointestinal: abdomen soft, Non -tender, No distension, Bowel Sounds present Musculoskeletal: no gross deformities, strength intact in all four extremities Skin: moist, warm, no rashes Neurological: no tremor with outstretched hands, Deep tendon reflexes normal in all four extremities.  Last thyroid function test was from 06/01/2016 when TSH was 0.709    Assessment & Plan:   1. Malignant neoplasm of thyroid gland ([place]) 2. Postsurgical hypothyroidism  -Patient is being seen at the kind request of Dr. Teryl Lucy. He does not have his complete records to review today. He is a patient with papillary thyroid cancer status post total thyroidectomy in May 2015 followed by what appears to be remnant ablation with I-131. To his recollection, he did not have any further surveillance imaging of the thyroid bed/neck. - I will proceed to request his records to review. He may need Thyrogen stimulated or body scan if this was not done in the recent past. -I will also send him to lab today for new set of thyroid function test to see if he needs dose adjustment on his levothyroxine. In the meantime I have advised him to remain on levothyroxine 175 g by mouth every morning.  - We discussed about correct intake of levothyroxine, at fasting, with water, separated by at least 30 minutes from breakfast, and separated by more than 4 hours from calcium, iron, multivitamins, acid reflux medications (PPIs). -Patient is made aware of the fact that thyroid hormone replacement is needed for life, dose to be adjusted by periodic monitoring of thyroid function tests.  His non-specific symptoms including fatigue associated  with clinical finding overeating in a patient who smoked for 50+ years could be a sign of COPD. I have advised him to consider smoking cessation and likely require at least chest x-ray for workup. I deferred this to his primary medical doctor.  - He will return in 1 week to review his thyroid records and new thyroid function tests.  - I advised patient to maintain close follow up with Andres Shad, MD for primary care needs. Follow up plan: Return in about 1 week (around 01/20/2017) for labs today, records from his surgeon.  Glade Lloyd, MD Phone: [phone]  Fax: [phone]   01/13/2017, 8:49 AM

## 2017-01-20 ENCOUNTER — Encounter: Payer: Self-pay | Admitting: "Endocrinology

## 2017-01-20 ENCOUNTER — Ambulatory Visit (INDEPENDENT_AMBULATORY_CARE_PROVIDER_SITE_OTHER): Payer: PPO | Admitting: "Endocrinology

## 2017-01-20 VITALS — BP 144/78 | HR 85 | Ht 70.5 in | Wt 219.0 lb

## 2017-01-20 DIAGNOSIS — E890 Postprocedural hypothyroidism: Secondary | ICD-10-CM

## 2017-01-20 DIAGNOSIS — C73 Malignant neoplasm of thyroid gland: Secondary | ICD-10-CM | POA: Diagnosis not present

## 2017-01-20 NOTE — Progress Notes (Signed)
Subjective:    Patient ID: Jose Burch, male    DOB: 1950/12/14, PCP Andres Shad, MD   Past Medical History:  Diagnosis Date  . Cancer ([place])    Thyroid  . Hypertension   . Insomnia   . Tobacco abuse    Past Surgical History:  Procedure Laterality Date  . THYROIDECTOMY  2015   Social History   Social History  . Marital status: Married    Spouse name: N/A  . Number of children: N/A  . Years of education: N/A   Social History Main Topics  . Smoking status: Current Some Day Smoker    Packs/day: 0.25    Types: Cigarettes  . Smokeless tobacco: Never Used  . Alcohol use No  . Drug use: No  . Sexual activity: No   Other Topics Concern  . None   Social History Narrative  . None   Outpatient Encounter Prescriptions as of 01/20/2017  Medication Sig  . amLODipine (NORVASC) 10 MG tablet Take 10 mg by mouth daily.  Marland Kitchen aspirin EC 81 MG tablet Take 1 tablet (81 mg total) by mouth daily.  . calcitRIOL (ROCALTROL) 0.5 MCG capsule Take 0.5 mcg by mouth daily.  . furosemide (LASIX) 40 MG tablet Take 40 mg by mouth daily.  Marland Kitchen KLOR-CON 10 10 MEQ tablet Take 10 mEq by mouth daily.   Marland Kitchen levothyroxine (SYNTHROID, LEVOTHROID) 175 MCG tablet Take 175 mcg by mouth daily before breakfast.  . LORazepam (ATIVAN) 1 MG tablet Take 1 mg by mouth at bedtime.  . nitroGLYCERIN (NITROSTAT) 0.4 MG SL tablet Place 1 tablet (0.4 mg total) under the tongue every 5 (five) minutes as needed for chest pain.  . traMADol (ULTRAM) 50 MG tablet Take 50 mg by mouth every 6 (six) hours as needed.  . valsartan (DIOVAN) 80 MG tablet Take 1 tablet (80 mg total) by mouth daily.   No facility-administered encounter medications on file as of 01/20/2017.    ALLERGIES: No Known Allergies  VACCINATION STATUS:  There is no immunization history on file for this patient.  HPI 66 year old man with medical history as above. He is being seen in Follow-up for history of papillary thyroid cancer .  - We  received some records from his surgeon. He is status post total thyroidectomy in 02/19/2014 followed by remnant ablation with I-131 On 03/22/2014, post therapy scan on 04/01/2014 showing 2 foci of indeterminate activity identified in the suspected neck region. He did not have any subsequent imaging.  He is on levothyroxine 175 g by mouth every morning. He is compliant with this medication. Per his recollection, he did not have any further surveillance imaging studies after his initial treatment for thyroid cancer. -He denies dysphagia, shortness of breath, voice change. He is on calcitriol 0.5 g 3 times a day since the time of his surgery. He reports that surgery was relatively difficult taking  6 hours to complete. He denies family history of thyroid cancer nor any thyroid dysfunction. He is a chronic active smoker for the last 15 years. He has hypertension on treatment. -He complains of fatigue, intermittent wheezing.  Review of Systems  Constitutional: Steady body weight , + fatigue, no subjective hyperthermia, no subjective hypothermia Eyes: no blurry vision, no xerophthalmia ENT: no sore throat, no nodules palpated in throat, no dysphagia/odynophagia, no hoarseness Cardiovascular: no Chest Pain, no Shortness of Breath, no palpitations, no leg swelling Respiratory: + cough, no SOB Gastrointestinal: no Nausea/Vomiting/Diarhhea Musculoskeletal: no muscle/joint aches Skin: no  rashes Neurological: no tremors, no numbness, no tingling, no dizziness Psychiatric: no depression, no anxiety  Objective:    BP (!) 144/78   Pulse 85   Ht 5' 10.5" (1.791 m)   Wt 219 lb (99.3 kg)   BMI 30.98 kg/m   Wt Readings from Last 3 Encounters:  01/20/17 219 lb (99.3 kg)  01/13/17 215 lb (97.5 kg)  11/16/16 220 lb (99.8 kg)    Physical Exam  Constitutional: Significantly over weight for hight, not in acute distress, normal state of mind Eyes: PERRLA, EOMI, no exophthalmos ENT: moist mucous  membranes, + long horizontal lower neck surgical scar from prior total thyroidectomy/neck dissection , no cervical lymphadenopathy Cardiovascular: normal precordial activity, Regular Rate and Rhythm, no Murmur/Rubs/Gallops Respiratory:  + Scattered wheezes on all lung fields,  no gross chest deformity. Gastrointestinal: abdomen soft, Non -tender, No distension, Bowel Sounds present Musculoskeletal: no gross deformities, strength intact in all four extremities Skin: moist, warm, no rashes Neurological: no tremor with outstretched hands, Deep tendon reflexes normal in all four extremities.    Recent Results (from the past 2160 hour(s))  TSH     Status: Abnormal   Collection Time: 01/13/17  9:09 AM  Result Value Ref Range   TSH 0.38 (L) 0.40 - 4.50 mIU/L  T4, free     Status: None   Collection Time: 01/13/17  9:09 AM  Result Value Ref Range   Free T4 1.7 0.8 - 1.8 ng/dL  T3, free     Status: None   Collection Time: 01/13/17  9:09 AM  Result Value Ref Range   T3, Free 3.3 2.3 - 4.2 pg/mL     Assessment & Plan:   1. Malignant neoplasm of thyroid gland ([place]) 2. Postsurgical hypothyroidism  - We have obtained some of his records from his ENT surgeon. He underwent total thyroidectomy on 02/19/2014 followed by thyroid remnant ablation with I-131 on 03/22/2014, was therapy whole-body scan on 04/01/2014 showed 2 foci of indeterminate activity identified in the neck region. No subsequent imaging studies for him. - I discussed and planned Thyrogen stimulated whole-body scan of the surveillance study and he agrees.  -  His repeat thyroid function test show appropriate thyroid hormone replacement with slightly suppressed TSH and free T4 high normal at 1.7. -  I have advised him to remain on levothyroxine 175 g by mouth every morning.  - We discussed about correct intake of levothyroxine, at fasting, with water, separated by at least 30 minutes from breakfast, and separated by more than 4 hours  from calcium, iron, multivitamins, acid reflux medications (PPIs). -Patient is made aware of the fact that thyroid hormone replacement is needed for life, dose to be adjusted by periodic monitoring of thyroid function tests.  His non-specific symptoms including fatigue associated with clinical finding  in a patient who smoked for 50+ years could be a sign of COPD. I have advised him to consider smoking cessation and likely require at least chest x-ray for workup. I deferred this to his primary medical doctor.  - He will return in 2 weeks to review his whole-body scan.   - I advised patient to maintain close follow up with Andres Shad, MD for primary care needs. Follow up plan: Return in about 2 weeks (around 02/03/2017) for Whole Body Scan w/Thyrogen.  Glade Lloyd, MD Phone: [phone]  Fax: [phone]   01/20/2017, 4:53 PM

## 2017-01-31 ENCOUNTER — Encounter (HOSPITAL_COMMUNITY): Payer: Self-pay

## 2017-01-31 ENCOUNTER — Encounter (HOSPITAL_COMMUNITY)
Admission: RE | Admit: 2017-01-31 | Discharge: 2017-01-31 | Disposition: A | Discharge status: Home / Self Care | Payer: PPO | Source: Ambulatory Visit | Attending: "Endocrinology | Admitting: "Endocrinology

## 2017-01-31 DIAGNOSIS — C73 Malignant neoplasm of thyroid gland: Secondary | ICD-10-CM | POA: Diagnosis not present

## 2017-01-31 MED ORDER — THYROTROPIN ALFA 1.1 MG IM SOLR
0.9000 mg | INTRAMUSCULAR | Status: AC
  Administered 2017-01-31: 0.9 mg via INTRAMUSCULAR

## 2017-01-31 MED ORDER — THYROTROPIN ALFA 1.1 MG IM SOLR
INTRAMUSCULAR | Status: AC
  Administered 2017-01-31: 0.9 mg via INTRAMUSCULAR
  Filled 2017-01-31: qty 0.9

## 2017-01-31 MED ORDER — STERILE WATER FOR INJECTION IJ SOLN
INTRAMUSCULAR | Status: AC
  Filled 2017-01-31: qty 10

## 2017-02-01 ENCOUNTER — Encounter (HOSPITAL_COMMUNITY)
Admission: RE | Admit: 2017-02-01 | Discharge: 2017-02-01 | Disposition: A | Discharge status: Home / Self Care | Payer: PPO | Source: Ambulatory Visit | Attending: "Endocrinology | Admitting: "Endocrinology

## 2017-02-01 DIAGNOSIS — C73 Malignant neoplasm of thyroid gland: Secondary | ICD-10-CM | POA: Diagnosis not present

## 2017-02-01 MED ORDER — THYROTROPIN ALFA 1.1 MG IM SOLR
0.9000 mg | INTRAMUSCULAR | Status: AC
Start: 2017-02-01 — End: 2017-02-01
  Administered 2017-02-01: 0.9 mg via INTRAMUSCULAR

## 2017-02-01 MED ORDER — THYROTROPIN ALFA 1.1 MG IM SOLR
INTRAMUSCULAR | Status: AC
  Administered 2017-02-01: 0.9 mg via INTRAMUSCULAR
  Filled 2017-02-01: qty 0.9

## 2017-02-02 ENCOUNTER — Other Ambulatory Visit (HOSPITAL_COMMUNITY)
Admission: RE | Admit: 2017-02-02 | Discharge: 2017-02-02 | Disposition: A | Discharge status: Home / Self Care | Payer: PPO | Source: Ambulatory Visit | Attending: "Endocrinology | Admitting: "Endocrinology

## 2017-02-02 ENCOUNTER — Encounter (HOSPITAL_COMMUNITY)
Admission: RE | Admit: 2017-02-02 | Discharge: 2017-02-02 | Disposition: A | Discharge status: Home / Self Care | Payer: PPO | Source: Ambulatory Visit | Attending: "Endocrinology | Admitting: "Endocrinology

## 2017-02-02 ENCOUNTER — Other Ambulatory Visit: Payer: Self-pay | Admitting: "Endocrinology

## 2017-02-02 DIAGNOSIS — C73 Malignant neoplasm of thyroid gland: Secondary | ICD-10-CM | POA: Insufficient documentation

## 2017-02-02 MED ORDER — SODIUM IODIDE I 131 CAPSULE
4.0000 | Freq: Once | INTRAVENOUS | Status: AC | PRN
  Administered 2017-02-02: 4 via ORAL

## 2017-02-03 LAB — THYROGLOBULIN ANTIBODY: Thyroglobulin Antibody: <1 IU/mL (ref 0.0–0.9)

## 2017-02-04 ENCOUNTER — Encounter (HOSPITAL_COMMUNITY): Payer: Self-pay

## 2017-02-04 ENCOUNTER — Encounter (HOSPITAL_COMMUNITY)
Admission: RE | Admit: 2017-02-04 | Discharge: 2017-02-04 | Disposition: A | Discharge status: Home / Self Care | Payer: PPO | Source: Ambulatory Visit | Attending: "Endocrinology | Admitting: "Endocrinology

## 2017-02-04 DIAGNOSIS — C73 Malignant neoplasm of thyroid gland: Secondary | ICD-10-CM | POA: Diagnosis not present

## 2017-02-04 DIAGNOSIS — Z85850 Personal history of malignant neoplasm of thyroid: Secondary | ICD-10-CM | POA: Diagnosis not present

## 2017-02-07 LAB — THYROGLOBULIN LEVEL: Thyroglobulin: <2 ng/mL

## 2017-02-10 ENCOUNTER — Encounter: Payer: Self-pay | Admitting: "Endocrinology

## 2017-02-10 ENCOUNTER — Ambulatory Visit (INDEPENDENT_AMBULATORY_CARE_PROVIDER_SITE_OTHER): Payer: PPO | Admitting: "Endocrinology

## 2017-02-10 VITALS — BP 153/79 | HR 73 | Ht 70.5 in | Wt 217.0 lb

## 2017-02-10 DIAGNOSIS — C73 Malignant neoplasm of thyroid gland: Secondary | ICD-10-CM | POA: Diagnosis not present

## 2017-02-10 DIAGNOSIS — E890 Postprocedural hypothyroidism: Secondary | ICD-10-CM | POA: Diagnosis not present

## 2017-02-10 NOTE — Progress Notes (Signed)
Subjective:    Patient ID: Jose Burch, male    DOB: 01-Dec-1950, PCP Andres Shad, MD   Past Medical History:  Diagnosis Date  . Cancer ([place])    Thyroid  . Hypertension   . Insomnia   . Tobacco abuse    Past Surgical History:  Procedure Laterality Date  . THYROIDECTOMY  2015   Social History   Social History  . Marital status: Married    Spouse name: N/A  . Number of children: N/A  . Years of education: N/A   Social History Main Topics  . Smoking status: Current Some Day Smoker    Packs/day: 0.25    Types: Cigarettes  . Smokeless tobacco: Never Used  . Alcohol use No  . Drug use: No  . Sexual activity: No   Other Topics Concern  . None   Social History Narrative  . None   Outpatient Encounter Prescriptions as of 02/10/2017  Medication Sig  . amLODipine (NORVASC) 10 MG tablet Take 10 mg by mouth daily.  Marland Kitchen aspirin EC 81 MG tablet Take 1 tablet (81 mg total) by mouth daily.  . calcitRIOL (ROCALTROL) 0.5 MCG capsule Take 0.5 mcg by mouth daily.  Marland Kitchen levothyroxine (SYNTHROID, LEVOTHROID) 175 MCG tablet Take 175 mcg by mouth daily before breakfast.  . LORazepam (ATIVAN) 1 MG tablet Take 1 mg by mouth at bedtime.  . nitroGLYCERIN (NITROSTAT) 0.4 MG SL tablet Place 1 tablet (0.4 mg total) under the tongue every 5 (five) minutes as needed for chest pain.  . traMADol (ULTRAM) 50 MG tablet Take 50 mg by mouth every 6 (six) hours as needed.  . valsartan (DIOVAN) 80 MG tablet Take 1 tablet (80 mg total) by mouth daily.  . [DISCONTINUED] furosemide (LASIX) 40 MG tablet Take 40 mg by mouth daily.  . [DISCONTINUED] KLOR-CON 10 10 MEQ tablet Take 10 mEq by mouth daily.    No facility-administered encounter medications on file as of 02/10/2017.    ALLERGIES: No Known Allergies  VACCINATION STATUS:  There is no immunization history on file for this patient.  HPI 66 year old man with medical history as above. He is being seen in Follow-up for history of papillary  thyroid cancer .  - We received some records from his surgeon. He is status post total thyroidectomy in 02/19/2014 followed by remnant ablation with I-131 On 03/22/2014, post therapy scan on 04/01/2014 showing 2 foci of indeterminate activity identified in the suspected neck region. He did not have any subsequent imaging.  He is on levothyroxine 175 g by mouth every morning. He is compliant with this medication. - He underwent Thyrogen stimulated whole-body scan which was negative for metastatic thyroid cancer- on February 04, 2017. -He denies dysphagia, shortness of breath, voice change. He is on calcitriol 0.5 g 3 times a day since the time of his surgery. He reports that surgery was relatively difficult taking  6 hours to complete. He denies family history of thyroid cancer nor any thyroid dysfunction. He is a chronic active smoker for the last 15 years. He has hypertension on treatment. -He complains of fatigue, intermittent wheezing.  Review of Systems  Constitutional: Steady body weight , + fatigue, no subjective hyperthermia, no subjective hypothermia Eyes: no blurry vision, no xerophthalmia ENT: no sore throat, no nodules palpated in throat, no dysphagia/odynophagia, no hoarseness Cardiovascular: no Chest Pain, no Shortness of Breath, no palpitations, no leg swelling Respiratory: + cough, no SOB Gastrointestinal: no Nausea/Vomiting/Diarhhea Musculoskeletal: no muscle/joint aches Skin:  no rashes Neurological: no tremors, no numbness, no tingling, no dizziness Psychiatric: no depression, no anxiety  Objective:    BP (!) 153/79   Pulse 73   Ht 5' 10.5" (1.791 m)   Wt 217 lb (98.4 kg)   BMI 30.70 kg/m   Wt Readings from Last 3 Encounters:  02/10/17 217 lb (98.4 kg)  01/20/17 219 lb (99.3 kg)  01/13/17 215 lb (97.5 kg)    Physical Exam  Constitutional: Significantly over weight for hight, not in acute distress, normal state of mind Eyes: PERRLA, EOMI, no exophthalmos ENT:  moist mucous membranes, + long horizontal lower neck surgical scar from prior total thyroidectomy/neck dissection , no cervical lymphadenopathy Cardiovascular: normal precordial activity, Regular Rate and Rhythm, no Murmur/Rubs/Gallops Respiratory:  + Scattered wheezes on all lung fields,  no gross chest deformity. Gastrointestinal: abdomen soft, Non -tender, No distension, Bowel Sounds present Musculoskeletal: no gross deformities, strength intact in all four extremities Skin: moist, warm, no rashes Neurological: no tremor with outstretched hands, Deep tendon reflexes normal in all four extremities.    Recent Results (from the past 2160 hour(s))  TSH     Status: Abnormal   Collection Time: 01/13/17  9:09 AM  Result Value Ref Range   TSH 0.38 (L) 0.40 - 4.50 mIU/L  T4, free     Status: None   Collection Time: 01/13/17  9:09 AM  Result Value Ref Range   Free T4 1.7 0.8 - 1.8 ng/dL  T3, free     Status: None   Collection Time: 01/13/17  9:09 AM  Result Value Ref Range   T3, Free 3.3 2.3 - 4.2 pg/mL  Thyroglobulin antibody     Status: None   Collection Time: 02/02/17  8:57 AM  Result Value Ref Range   Thyroglobulin Antibody <1.0 0.0 - 0.9 IU/mL    Comment: (NOTE) Thyroglobulin Antibody measured by [facility] Methodology Performed At: [facility] [address] [place], [place] [id number] Lindon Romp MD [id number]   Thyroglobulin Level     Status: None   Collection Time: 02/02/17  9:04 AM  Result Value Ref Range   Thyroglobulin <2.0 ng/mL    Comment: (NOTE) Reference Range: Pubertal Children and Adults: <40 According to the [facility] of Clinical Biochemistry, the reference interval for Thyroglobulin (TG) should be related to euthyroid patients and not for patients who underwent thyroidectomy.  TG reference intervals for these patients depend on the residual mass of the thyroid tissue left after surgery.  Establishing a post-operative baseline is  recommended.  The assay quantitation limit is 2.0 ng/mL. Performed At: [facility] [place], [place] [id number] Pepkowitz Sheral Apley MD [id number]      Assessment & Plan:   1. Malignant neoplasm of thyroid gland ([place]) 2. Postsurgical hypothyroidism  - We have obtained some of his records from his ENT surgeon. He underwent total thyroidectomy on 02/19/2014 followed by thyroid remnant ablation with I-131 on 03/22/2014, was therapy whole-body scan on 04/01/2014 showed 2 foci of indeterminate activity identified in the neck region. No subsequent imaging studies for him. - I discussed  Thyrogen stimulated whole-body scan - no evidence of metastatic thyroid cancer- done on 02/04/2017 - He will need thyroid/neck ultrasound in 1 year. -  His repeat thyroid function test show appropriate thyroid hormone replacement with slightly suppressed TSH and free T4 high normal at 1.7. -  I have advised him to remain on levothyroxine 175 g by mouth every morning.  -  We discussed about correct intake of levothyroxine, at fasting, with water, separated by at least 30 minutes from breakfast, and separated by more than 4 hours from calcium, iron, multivitamins, acid reflux medications (PPIs). -Patient is made aware of the fact that thyroid hormone replacement is needed for life, dose to be adjusted by periodic monitoring of thyroid function tests.  His non-specific symptoms including fatigue associated with clinical finding  in a patient who smoked for 50+ years could be a sign of COPD. I have advised him to consider smoking cessation and likely require at least chest x-ray for workup. I deferred this to his primary medical doctor.   - I advised patient to maintain close follow up with Andres Shad, MD for primary care needs. Follow up plan: Return in about 6 months (around 08/13/2017) for follow up with pre-visit labs.  Glade Lloyd, MD Phone: [phone]  Fax:  [phone]   02/10/2017, 11:33 AM

## 2017-02-23 ENCOUNTER — Telehealth: Payer: Self-pay

## 2017-02-23 DIAGNOSIS — Z6831 Body mass index (BMI) 31.0-31.9, adult: Secondary | ICD-10-CM | POA: Diagnosis not present

## 2017-02-23 DIAGNOSIS — T148XXA Other injury of unspecified body region, initial encounter: Secondary | ICD-10-CM | POA: Diagnosis not present

## 2017-02-23 DIAGNOSIS — I10 Essential (primary) hypertension: Secondary | ICD-10-CM | POA: Diagnosis not present

## 2017-02-23 DIAGNOSIS — Z1389 Encounter for screening for other disorder: Secondary | ICD-10-CM | POA: Diagnosis not present

## 2017-02-23 MED ORDER — LEVOTHYROXINE SODIUM 175 MCG PO TABS: 175.0000 ug | ORAL_TABLET | Freq: Every day | ORAL | 1 refills | Status: DC

## 2017-02-23 NOTE — Telephone Encounter (Signed)
Pt needs refill on levothyroxine please call into [facility] [address] [place]

## 2017-03-19 IMAGING — DX DG CHEST 2V
2 series · 2 of 2 positions shown · non-contrast
Comparison: None.

CLINICAL DATA: Chest pressure for 5-6 months.

EXAM:
CHEST  2 VIEW

[chest pa]
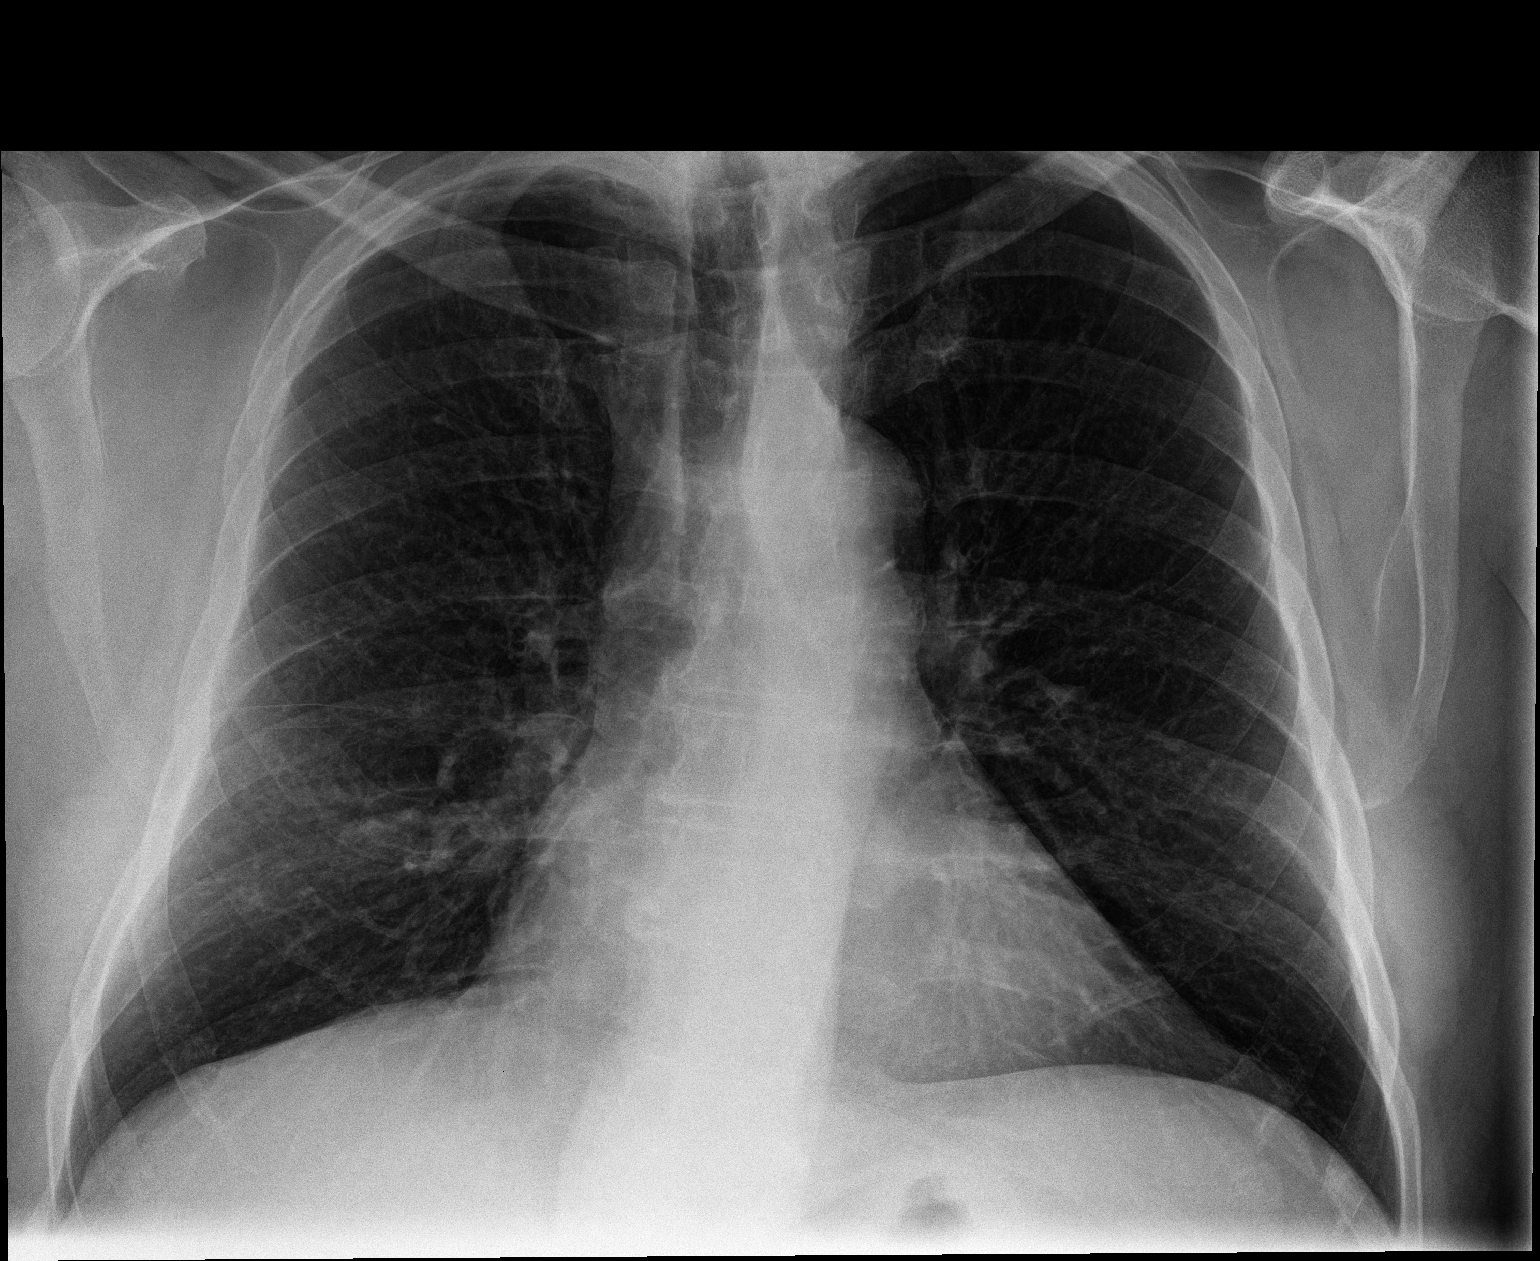

[chest lat]
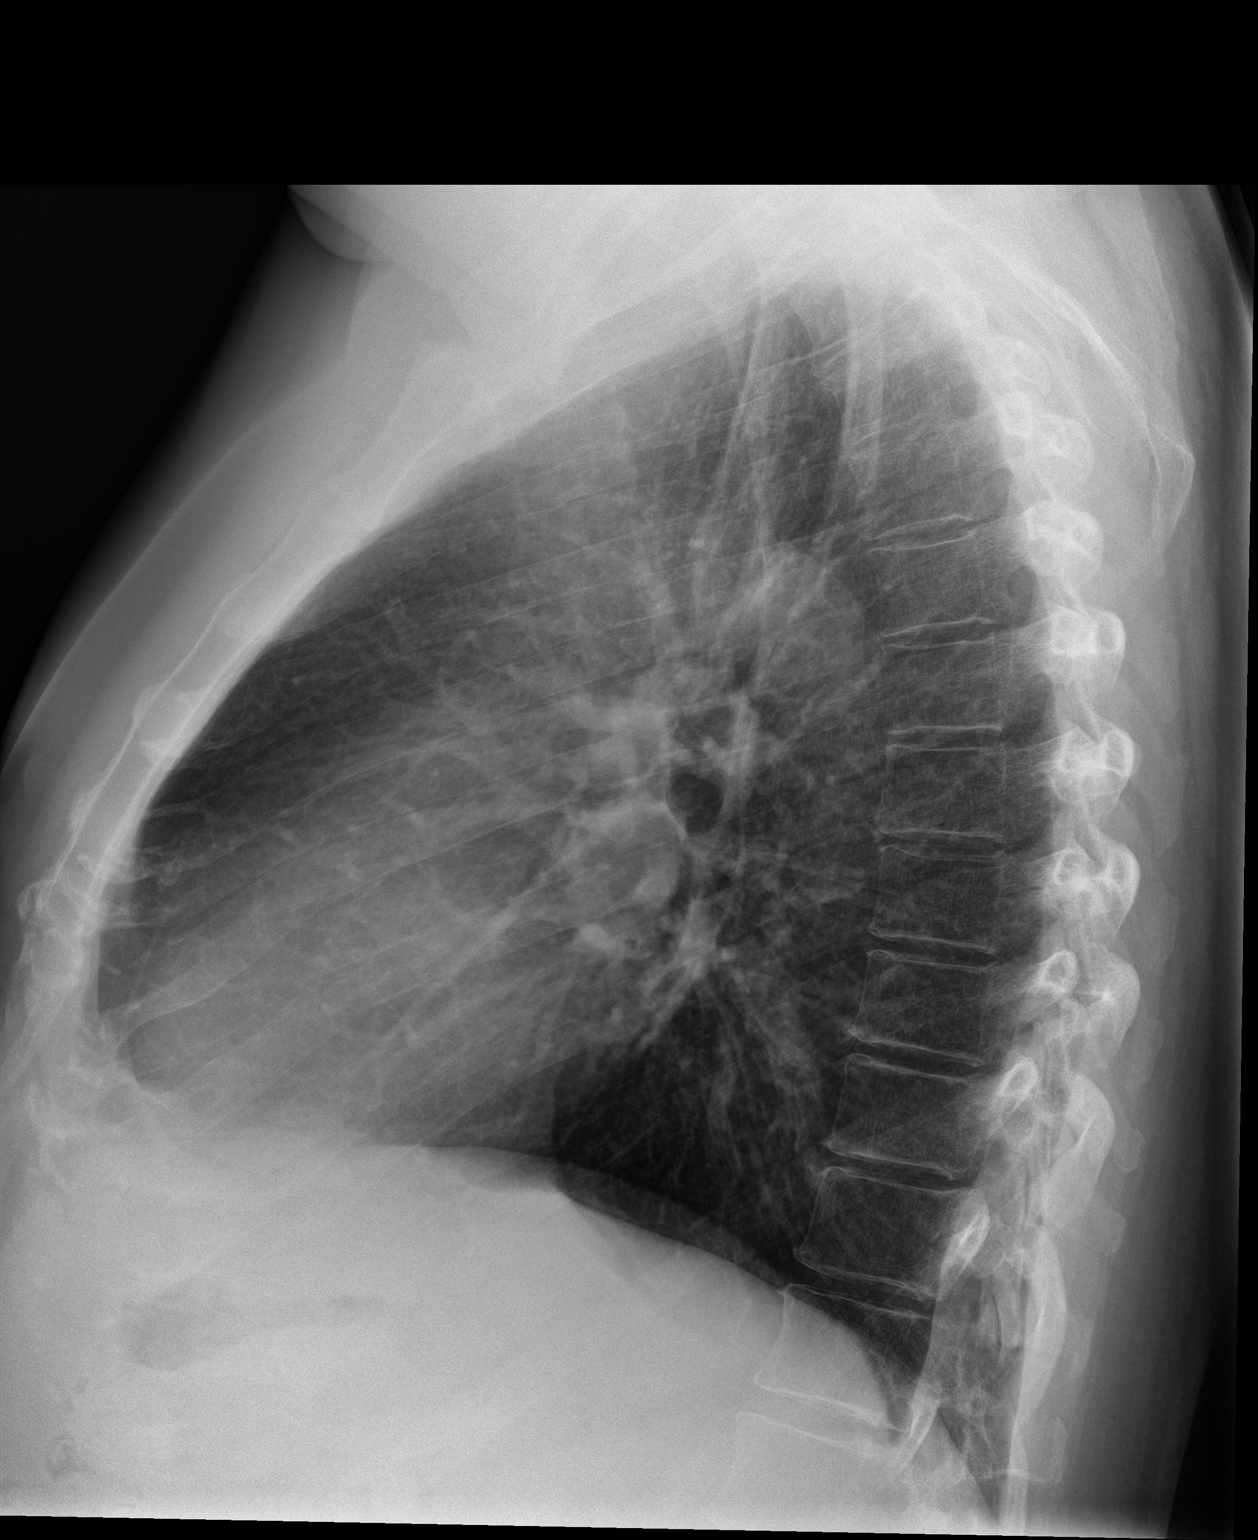

[2 of 2 positions shown; findings below may reference images not displayed]

FINDINGS: Heart and mediastinal contours are within normal limits. No focal
opacities or effusions. No acute bony abnormality.
IMPRESSION: No active cardiopulmonary disease.

## 2017-05-12 ENCOUNTER — Ambulatory Visit: Payer: PPO | Admitting: Cardiovascular Disease

## 2017-05-19 ENCOUNTER — Encounter: Payer: Self-pay | Admitting: Cardiovascular Disease

## 2017-05-19 ENCOUNTER — Ambulatory Visit (INDEPENDENT_AMBULATORY_CARE_PROVIDER_SITE_OTHER): Payer: PPO | Admitting: Cardiovascular Disease

## 2017-05-19 VITALS — BP 154/76 | HR 79 | Ht 70.0 in | Wt 223.0 lb

## 2017-05-19 DIAGNOSIS — Z720 Tobacco use: Secondary | ICD-10-CM

## 2017-05-19 DIAGNOSIS — R0602 Shortness of breath: Secondary | ICD-10-CM

## 2017-05-19 DIAGNOSIS — R0789 Other chest pain: Secondary | ICD-10-CM

## 2017-05-19 DIAGNOSIS — R5383 Other fatigue: Secondary | ICD-10-CM

## 2017-05-19 DIAGNOSIS — I10 Essential (primary) hypertension: Secondary | ICD-10-CM | POA: Diagnosis not present

## 2017-05-19 MED ORDER — LOSARTAN POTASSIUM 50 MG PO TABS: 50.0000 mg | ORAL_TABLET | Freq: Every day | ORAL | 6 refills | Status: DC

## 2017-05-19 NOTE — Progress Notes (Signed)
SUBJECTIVE: The patient presents for follow-up of chest pain and hypertension.  Echocardiogram in 06/2016 showed normal left ventricular systolic function, LVEF 03-55%, normal regional wall motion, moderate LVH, grade 1 diastolic dysfunction, mild mitral regurgitation.  Nuclear stress test in 06/2016 showed no evidence of myocardial ischemia or scar and was deemed a low risk study.  ECG performed in the office today which I personally interpreted demonstrated sinus rhythm with left anterior fascicular block.  He continues to feel fatigued when working outside in the heat and humidity. He likes to be active but it has been depressing. He is cut back smoking from 2 packs of cigarettes daily to 6 cigarettes daily. He does have exertional chest tightness. He has used nitroglycerin twice since his last visit with me.   Review of Systems: As per "subjective", otherwise negative.  No Known Allergies  Current Outpatient Prescriptions  Medication Sig Dispense Refill  . amLODipine (NORVASC) 10 MG tablet Take 10 mg by mouth daily.    Marland Kitchen aspirin EC 81 MG tablet Take 1 tablet (81 mg total) by mouth daily.    . calcitRIOL (ROCALTROL) 0.5 MCG capsule Take 0.5 mcg by mouth daily.    Marland Kitchen levothyroxine (SYNTHROID, LEVOTHROID) 175 MCG tablet Take 1 tablet (175 mcg total) by mouth daily before breakfast. 90 tablet 1  . LORazepam (ATIVAN) 1 MG tablet Take 1 mg by mouth at bedtime.    . nitroGLYCERIN (NITROSTAT) 0.4 MG SL tablet Place 1 tablet (0.4 mg total) under the tongue every 5 (five) minutes as needed for chest pain. 25 tablet 3  . traMADol (ULTRAM) 50 MG tablet Take 50 mg by mouth every 6 (six) hours as needed.    . valsartan (DIOVAN) 80 MG tablet Take 1 tablet (80 mg total) by mouth daily. 90 tablet 3   No current facility-administered medications for this visit.     Past Medical History:  Diagnosis Date  . Cancer ([place])    Thyroid  . Hypertension   . Insomnia   . Tobacco abuse     Past  Surgical History:  Procedure Laterality Date  . THYROIDECTOMY  2015    Social History   Social History  . Marital status: Married    Spouse name: N/A  . Number of children: N/A  . Years of education: N/A   Occupational History  . Not on file.   Social History Main Topics  . Smoking status: Current Every Day Smoker    Packs/day: 0.25    Types: Cigarettes  . Smokeless tobacco: Never Used  . Alcohol use No  . Drug use: No  . Sexual activity: No   Other Topics Concern  . Not on file   Social History Narrative  . No narrative on file     Vitals:   05/19/17 1028  BP: (!) 154/76  Pulse: 79  SpO2: 97%  Weight: 223 lb (101.2 kg)  Height: 5\' 10"  (1.778 m)    Wt Readings from Last 3 Encounters:  05/19/17 223 lb (101.2 kg)  02/10/17 217 lb (98.4 kg)  01/20/17 219 lb (99.3 kg)     PHYSICAL EXAM General: NAD HEENT: Normal. Neck: No JVD, no thyromegaly. Lungs: Clear to auscultation bilaterally with normal respiratory effort. CV: Nondisplaced PMI.  Regular rate and rhythm, normal S1/S2, no S3/S4, no murmur. No pretibial or periankle edema.  No carotid bruit.   Abdomen: Soft, nontender, no distention.  Neurologic: Alert and oriented.  Psych: Normal affect. Skin: Normal. Musculoskeletal:  No gross deformities.    ECG: Most recent ECG reviewed.   Labs: Lab Results  Component Value Date/Time   TSH 0.38 (L) 01/13/2017 09:09 AM     Lipids: No results found for: LDLCALC, LDLDIRECT, CHOL, TRIG, HDL     ASSESSMENT AND PLAN:  1. Chest pain and tightness: Symptoms are stable. Will aim to control BP. Continue ASA 81 mg and SL nitro prn. No ischemia or scar on nuclear stress testing. Normal LV systolic function. No further cardiac testing indicated.  2. Shortness of breath and fatigue: He may have COPD given his years of tobacco abuse. I will obtain spirometry.  3. Hypertension: Elevated. I will switch valsartan to losartan 50 mg daily.  4. Tobacco abuse: He  has cut back from 2 packs of cigarettes daily to 6 cigarettes daily. I counseled him on cessation for 3 minutes.   Disposition: Follow up 4 months   Kate Sable, M.D., F.A.C.C.

## 2017-05-19 NOTE — Patient Instructions (Signed)
Medication Instructions:   Stop Diovan (Valsartan).  Begin Losartan 50mg  daily.  Continue all other medications.    Labwork: none  Testing/Procedures:  Spirometry   Office will contact with results via phone or letter.    Follow-Up: 4 months   Any Other Special Instructions Will Be Listed Below (If Applicable).  If you need a refill on your cardiac medications before your next appointment, please call your pharmacy.

## 2017-05-19 NOTE — Addendum Note (Signed)
Addended by: Laurine Blazer on: 05/19/2017 11:05 AM   Modules accepted: Orders

## 2017-05-27 ENCOUNTER — Ambulatory Visit (HOSPITAL_COMMUNITY)
Admission: RE | Admit: 2017-05-27 | Discharge: 2017-05-27 | Disposition: A | Discharge status: Home / Self Care | Payer: PPO | Source: Ambulatory Visit | Attending: Cardiovascular Disease | Admitting: Cardiovascular Disease

## 2017-05-27 DIAGNOSIS — R0602 Shortness of breath: Secondary | ICD-10-CM | POA: Insufficient documentation

## 2017-05-27 DIAGNOSIS — Z720 Tobacco use: Secondary | ICD-10-CM | POA: Insufficient documentation

## 2017-05-27 DIAGNOSIS — J449 Chronic obstructive pulmonary disease, unspecified: Secondary | ICD-10-CM | POA: Diagnosis not present

## 2017-05-27 MED ORDER — ALBUTEROL SULFATE (2.5 MG/3ML) 0.083% IN NEBU
2.5000 mg | INHALATION_SOLUTION | Freq: Once | RESPIRATORY_TRACT | Status: AC
  Administered 2017-05-27: 2.5 mg via RESPIRATORY_TRACT

## 2017-05-29 LAB — SPIROMETRY WITH GRAPH
FEF 25-75 POST: 1.23 L/s
FEF 25-75 PRE: 0.78 L/s
FEF2575-%Change-Post: 57 %
FEF2575-%Pred-Post: 46 %
FEF2575-%Pred-Pre: 29 %
FEV1-%CHANGE-POST: 12 %
FEV1-%PRED-POST: 56 %
FEV1-%PRED-PRE: 50 %
FEV1-POST: 1.92 L
FEV1-Pre: 1.71 L
FEV1FVC-%Change-Post: 2 %
FEV1FVC-%PRED-PRE: 82 %
FEV6-%CHANGE-POST: 11 %
FEV6-%PRED-POST: 71 %
FEV6-%Pred-Pre: 63 %
FEV6-POST: 3.05 L
FEV6-Pre: 2.75 L
FEV6FVC-%CHANGE-POST: 1 %
FEV6FVC-%PRED-POST: 104 %
FEV6FVC-%Pred-Pre: 103 %
FVC-%CHANGE-POST: 9 %
FVC-%PRED-POST: 67 %
FVC-%Pred-Pre: 61 %
FVC-Post: 3.07 L
FVC-Pre: 2.81 L
POST FEV1/FVC RATIO: 62 %
PRE FEV6/FVC RATIO: 98 %
Post FEV6/FVC ratio: 99 %
Pre FEV1/FVC ratio: 61 %

## 2017-05-31 ENCOUNTER — Telehealth: Payer: Self-pay | Admitting: Cardiovascular Disease

## 2017-05-31 DIAGNOSIS — F17200 Nicotine dependence, unspecified, uncomplicated: Secondary | ICD-10-CM | POA: Diagnosis not present

## 2017-05-31 DIAGNOSIS — Z6831 Body mass index (BMI) 31.0-31.9, adult: Secondary | ICD-10-CM | POA: Diagnosis not present

## 2017-05-31 DIAGNOSIS — Z713 Dietary counseling and surveillance: Secondary | ICD-10-CM | POA: Diagnosis not present

## 2017-05-31 DIAGNOSIS — R0600 Dyspnea, unspecified: Secondary | ICD-10-CM | POA: Diagnosis not present

## 2017-05-31 DIAGNOSIS — J449 Chronic obstructive pulmonary disease, unspecified: Secondary | ICD-10-CM | POA: Diagnosis not present

## 2017-05-31 DIAGNOSIS — F17210 Nicotine dependence, cigarettes, uncomplicated: Secondary | ICD-10-CM | POA: Diagnosis not present

## 2017-05-31 NOTE — Telephone Encounter (Signed)
Would like to know test results

## 2017-06-01 MED ORDER — FLUTICASONE-SALMETEROL 100-50 MCG/DOSE IN AEPB: 1.0000 | INHALATION_SPRAY | Freq: Two times a day (BID) | RESPIRATORY_TRACT | 6 refills | Status: DC

## 2017-06-01 NOTE — Telephone Encounter (Signed)
Notes recorded by Laurine Blazer, LPN on 2/97/9892 at 1:19 PM EDT Wife Kendrick Fries) notified. Copy to pmd. New medication sent to [facility] now. ------  Notes recorded by Herminio Commons, MD on 05/30/2017 at 8:36 AM EDT Start Advair Diskus 100 mcg/50 mcg BID. Smoking cessation is imperative. He has mild to moderate COPD

## 2017-06-03 ENCOUNTER — Telehealth: Payer: Self-pay | Admitting: Cardiovascular Disease

## 2017-06-03 NOTE — Telephone Encounter (Signed)
Jose Burch called stating that he is returning a call to [facility].

## 2017-06-03 NOTE — Telephone Encounter (Signed)
Returned call to patient.  Patient calling back with questions about his spirometry test results.  More explanation given in regards to dx of COPD.  Patient verbalized understanding.  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  Stated that the Advair inhaler really opens him up.  Encouraged smoking cessation again as well.  Patient verbalized understanding.

## 2017-06-21 DIAGNOSIS — Z23 Encounter for immunization: Secondary | ICD-10-CM | POA: Diagnosis not present

## 2017-08-05 ENCOUNTER — Other Ambulatory Visit: Payer: Self-pay | Admitting: *Deleted

## 2017-08-05 MED ORDER — LOSARTAN POTASSIUM 50 MG PO TABS: 50.0000 mg | ORAL_TABLET | Freq: Every day | ORAL | 3 refills | Status: DC

## 2017-08-08 DIAGNOSIS — E890 Postprocedural hypothyroidism: Secondary | ICD-10-CM | POA: Diagnosis not present

## 2017-08-08 LAB — COMPLETE METABOLIC PANEL WITH GFR
AG Ratio: 1.3 (calc) (ref 1.0–2.5)
ALBUMIN MSPROF: 4.1 g/dL (ref 3.6–5.1)
ALKALINE PHOSPHATASE (APISO): 64 U/L (ref 40–115)
ALT: 14 U/L (ref 9–46)
AST: 15 U/L (ref 10–35)
BUN: 13 mg/dL (ref 7–25)
CO2: 28 mmol/L (ref 20–32)
CREATININE: 1.02 mg/dL (ref 0.70–1.25)
Calcium: 8.5 mg/dL — ABNORMAL LOW (ref 8.6–10.3)
Chloride: 101 mmol/L (ref 98–110)
GFR, EST AFRICAN AMERICAN: 88 mL/min/{1.73_m2} (ref >=60)
GFR, EST NON AFRICAN AMERICAN: 76 mL/min/{1.73_m2} (ref >=60)
GLOBULIN: 3.1 g/dL (ref 1.9–3.7)
Glucose, Bld: 149 mg/dL — ABNORMAL HIGH (ref 65–139)
Potassium: 3.9 mmol/L (ref 3.5–5.3)
SODIUM: 138 mmol/L (ref 135–146)
TOTAL PROTEIN: 7.2 g/dL (ref 6.1–8.1)
Total Bilirubin: 0.4 mg/dL (ref 0.2–1.2)

## 2017-08-08 LAB — T4, FREE: Free T4: 1.6 ng/dL (ref 0.8–1.8)

## 2017-08-08 LAB — TSH: TSH: 0.81 mIU/L (ref 0.40–4.50)

## 2017-08-15 ENCOUNTER — Encounter: Payer: Self-pay | Admitting: "Endocrinology

## 2017-08-15 ENCOUNTER — Ambulatory Visit: Payer: PPO | Admitting: "Endocrinology

## 2017-08-15 VITALS — BP 155/76 | HR 79 | Ht 70.0 in | Wt 228.0 lb

## 2017-08-15 DIAGNOSIS — C73 Malignant neoplasm of thyroid gland: Secondary | ICD-10-CM | POA: Diagnosis not present

## 2017-08-15 DIAGNOSIS — E890 Postprocedural hypothyroidism: Secondary | ICD-10-CM | POA: Diagnosis not present

## 2017-08-15 NOTE — Progress Notes (Signed)
Subjective:    Patient ID: Jose Burch, male    DOB: 1951/09/11, PCP Andres Shad, MD   Past Medical History:  Diagnosis Date  . Cancer ([place])    Thyroid  . Hypertension   . Insomnia   . Tobacco abuse    Past Surgical History:  Procedure Laterality Date  . THYROIDECTOMY  2015   Social History   Socioeconomic History  . Marital status: Married    Spouse name: None  . Number of children: None  . Years of education: None  . Highest education level: None  Social Needs  . Financial resource strain: None  . Food insecurity - worry: None  . Food insecurity - inability: None  . Transportation needs - medical: None  . Transportation needs - non-medical: None  Occupational History  . None  Tobacco Use  . Smoking status: Current Every Day Smoker    Packs/day: 0.25    Types: Cigarettes  . Smokeless tobacco: Never Used  Substance and Sexual Activity  . Alcohol use: No  . Drug use: No  . Sexual activity: No    Partners: Female  Other Topics Concern  . None  Social History Narrative  . None   Outpatient Encounter Medications as of 08/15/2017  Medication Sig  . amLODipine (NORVASC) 10 MG tablet Take 10 mg by mouth daily.  Marland Kitchen aspirin EC 81 MG tablet Take 1 tablet (81 mg total) by mouth daily.  . calcitRIOL (ROCALTROL) 0.5 MCG capsule Take 0.5 mcg by mouth daily.  . Fluticasone-Salmeterol (ADVAIR DISKUS) 100-50 MCG/DOSE AEPB Inhale 1 puff into the lungs 2 (two) times daily.  Marland Kitchen levothyroxine (SYNTHROID, LEVOTHROID) 175 MCG tablet Take 1 tablet (175 mcg total) by mouth daily before breakfast.  . LORazepam (ATIVAN) 1 MG tablet Take 1 mg by mouth at bedtime.  Marland Kitchen losartan (COZAAR) 50 MG tablet Take 1 tablet (50 mg total) by mouth daily.  . nitroGLYCERIN (NITROSTAT) 0.4 MG SL tablet Place 1 tablet (0.4 mg total) under the tongue every 5 (five) minutes as needed for chest pain.  . traMADol (ULTRAM) 50 MG tablet Take 50 mg by mouth every 6 (six) hours as needed.   No  facility-administered encounter medications on file as of 08/15/2017.    ALLERGIES: No Known Allergies  VACCINATION STATUS:  There is no immunization history on file for this patient.  HPI 66 year old man with medical history as above. He is being seen in Follow-up for history of papillary thyroid cancer, post thyroidectomy hypothyroidism.  - He is status post total thyroidectomy in 02/19/2014 followed by remnant ablation with I-131 On 03/22/2014, post therapy scan on 04/01/2014 showing 2 foci of indeterminate activity identified in the suspected neck region. -  He is on levothyroxine 175 g by mouth every morning. He is compliant with this medication. - His labs from 02/02/2017 showed thyroglobulin level less than 2.0. - He underwent Thyrogen stimulated whole-body scan which was negative for metastatic thyroid cancer- on February 04, 2017. -He denies dysphagia, shortness of breath, voice change. He is on calcitriol 0.5 g 3 times a day since the time of his surgery. He reports that surgery was relatively difficult taking  6 hours to complete. - His recent labs show mild hypocalcemia at 8.5. He denies family history of thyroid cancer nor any thyroid dysfunction. He is a chronic active smoker for the last 15 years. He has hypertension on treatment. -He complains of fatigue, intermittent wheezing.  Review of Systems  Constitutional:  +  Steady weight gain , + fatigue, no subjective hyperthermia, no subjective hypothermia Eyes: no blurry vision, no xerophthalmia ENT: no sore throat, no nodules palpated in throat, no dysphagia/odynophagia, no hoarseness Cardiovascular: no Chest Pain, no Shortness of Breath, no palpitations, no leg swelling Respiratory: + cough, no SOB Gastrointestinal: no Nausea/Vomiting/Diarhhea Musculoskeletal: no muscle/joint aches Skin: no rashes Neurological: no tremors, no numbness, no tingling, no dizziness Psychiatric: no depression, no anxiety  Objective:    BP  (!) 155/76 (BP Location: Left Arm, Patient Position: Sitting, Cuff Size: Large)   Pulse 79   Ht 5\' 10"  (1.778 m)   Wt 228 lb (103.4 kg)   BMI 32.71 kg/m   Wt Readings from Last 3 Encounters:  08/15/17 228 lb (103.4 kg)  05/19/17 223 lb (101.2 kg)  02/10/17 217 lb (98.4 kg)    Physical Exam  Constitutional: + obese for hight, not in acute distress, normal state of mind Eyes: PERRLA, EOMI, no exophthalmos ENT: moist mucous membranes, + long horizontal lower neck surgical scar from prior total thyroidectomy/neck dissection , no cervical lymphadenopathy Cardiovascular: normal precordial activity, Regular Rate and Rhythm, no Murmur/Rubs/Gallops Respiratory:  + Scattered wheezes on all lung fields,  no gross chest deformity. Gastrointestinal: abdomen soft, Non -tender, No distension, Bowel Sounds present Musculoskeletal: no gross deformities, strength intact in all four extremities Skin: moist, warm, no rashes Neurological: no tremor with outstretched hands, Deep tendon reflexes normal in all four extremities.   Recent Results (from the past 2160 hour(s))  Spirometry with Graph     Status: None   Collection Time: 05/27/17 12:41 PM  Result Value Ref Range   FVC-Pre 2.81 L   FVC-%Pred-Pre 61 %   FVC-Post 3.07 L   FVC-%Pred-Post 67 %   FVC-%Change-Post 9 %   FEV1-Pre 1.71 L   FEV1-%Pred-Pre 50 %   FEV1-Post 1.92 L   FEV1-%Pred-Post 56 %   FEV1-%Change-Post 12 %   FEV6-Pre 2.75 L   FEV6-%Pred-Pre 63 %   FEV6-Post 3.05 L   FEV6-%Pred-Post 71 %   FEV6-%Change-Post 11 %   Pre FEV1/FVC ratio 61 %   FEV1FVC-%Pred-Pre 82 %   Post FEV1/FVC ratio 62 %   FEV1FVC-%Change-Post 2 %   Pre FEV6/FVC Ratio 98 %   FEV6FVC-%Pred-Pre 103 %   Post FEV6/FVC ratio 99 %   FEV6FVC-%Pred-Post 104 %   FEV6FVC-%Change-Post 1 %   FEF 25-75 Pre 0.78 L/sec   FEF2575-%Pred-Pre 29 %   FEF 25-75 Post 1.23 L/sec   FEF2575-%Pred-Post 46 %   FEF2575-%Change-Post 57 %  TSH     Status: None   Collection  Time: 08/08/17  9:57 AM  Result Value Ref Range   TSH 0.81 0.40 - 4.50 mIU/L  T4, free     Status: None   Collection Time: 08/08/17  9:57 AM  Result Value Ref Range   Free T4 1.6 0.8 - 1.8 ng/dL  COMPLETE METABOLIC PANEL WITH GFR     Status: Abnormal   Collection Time: 08/08/17  9:57 AM  Result Value Ref Range   Glucose, Bld 149 (H) 65 - 139 mg/dL    Comment: .        Non-fasting reference interval .    BUN 13 7 - 25 mg/dL   Creat 1.02 0.70 - 1.25 mg/dL    Comment: For patients >77 years of age, the reference limit for Creatinine is approximately 13% higher for people identified as African-American. .    GFR, Est Non African American 76 >  OR = 60 mL/min/1.1m2   GFR, Est African American 88 > OR = 60 mL/min/1.71m2   BUN/Creatinine Ratio NOT APPLICABLE 6 - 22 (calc)   Sodium 138 135 - 146 mmol/L   Potassium 3.9 3.5 - 5.3 mmol/L   Chloride 101 98 - 110 mmol/L   CO2 28 20 - 32 mmol/L   Calcium 8.5 (L) 8.6 - 10.3 mg/dL   Total Protein 7.2 6.1 - 8.1 g/dL   Albumin 4.1 3.6 - 5.1 g/dL   Globulin 3.1 1.9 - 3.7 g/dL (calc)   AG Ratio 1.3 1.0 - 2.5 (calc)   Total Bilirubin 0.4 0.2 - 1.2 mg/dL   Alkaline phosphatase (APISO) 64 40 - 115 U/L   AST 15 10 - 35 U/L   ALT 14 9 - 46 U/L     Assessment & Plan:   1. Malignant neoplasm of thyroid gland ([place]) 2. Postsurgical hypothyroidism  -  He underwent total thyroidectomy on 02/19/2014 followed by thyroid remnant ablation with I-131 on 03/22/2014, was therapy whole-body scan on 04/01/2014 showed 2 foci of indeterminate activity identified in the neck region. No subsequent imaging studies for him. - Thyrogen stimulated whole-body scan - no evidence of metastatic thyroid cancer- done on 02/04/2017 - He will need thyroid/neck ultrasound in 1 year. -  His repeat thyroid function test show appropriate thyroid hormone replacement with  TSH on target at 0.81  and free T4 high normal at 1.6. -  I have advised him to remain on levothyroxine  175 g by mouth every morning.   - We discussed about correct intake of levothyroxine, at fasting, with water, separated by at least 30 minutes from breakfast, and separated by more than 4 hours from calcium, iron, multivitamins, acid reflux medications (PPIs). -Patient is made aware of the fact that thyroid hormone replacement is needed for life, dose to be adjusted by periodic monitoring of thyroid function tests.  His non-specific symptoms including fatigue associated with clinical finding  in a patient who smoked for 50+ years could be a sign of COPD. I have advised him to consider smoking cessation and likely require at least chest x-ray for workup. I deferred this to his primary medical doctor.  3. Hypocalcemia - Likely a result of partial surgical hypoparathyroidism. I have advised him to be consistent on his calcitriol 0.5 g by mouth twice a day, and advised calcium carbonate 1250 mg by mouth daily with supper.  - I advised patient to maintain close follow up with Andres Shad, MD for primary care needs. Follow up plan: Return in about 6 months (around 02/12/2018) for follow up with pre-visit labs.  Glade Lloyd, MD Phone: [phone]  Fax: [phone]  -  This note was partially dictated with voice recognition software. Similar sounding words can be transcribed inadequately or may not  be corrected upon review.  08/15/2017, 1:23 PM

## 2017-08-22 ENCOUNTER — Other Ambulatory Visit: Payer: Self-pay | Admitting: *Deleted

## 2017-08-22 MED ORDER — FLUTICASONE-SALMETEROL 100-50 MCG/DOSE IN AEPB: 1.0000 | INHALATION_SPRAY | Freq: Two times a day (BID) | RESPIRATORY_TRACT | 0 refills | Status: DC

## 2017-08-30 DIAGNOSIS — Z6831 Body mass index (BMI) 31.0-31.9, adult: Secondary | ICD-10-CM | POA: Diagnosis not present

## 2017-08-30 DIAGNOSIS — R238 Other skin changes: Secondary | ICD-10-CM | POA: Diagnosis not present

## 2017-08-30 DIAGNOSIS — Z79899 Other long term (current) drug therapy: Secondary | ICD-10-CM | POA: Diagnosis not present

## 2017-08-30 DIAGNOSIS — J449 Chronic obstructive pulmonary disease, unspecified: Secondary | ICD-10-CM | POA: Diagnosis not present

## 2017-08-30 DIAGNOSIS — G4700 Insomnia, unspecified: Secondary | ICD-10-CM | POA: Diagnosis not present

## 2017-08-30 DIAGNOSIS — I10 Essential (primary) hypertension: Secondary | ICD-10-CM | POA: Diagnosis not present

## 2017-09-15 ENCOUNTER — Other Ambulatory Visit: Payer: Self-pay

## 2017-09-15 MED ORDER — CALCITRIOL 0.5 MCG PO CAPS: 0.5000 ug | ORAL_CAPSULE | Freq: Every day | ORAL | 0 refills | Status: DC

## 2017-09-22 ENCOUNTER — Ambulatory Visit: Payer: PPO | Admitting: Cardiovascular Disease

## 2017-10-21 ENCOUNTER — Encounter: Payer: Self-pay | Admitting: Cardiovascular Disease

## 2017-10-21 ENCOUNTER — Ambulatory Visit: Payer: PPO | Admitting: Cardiovascular Disease

## 2017-10-21 VITALS — BP 138/70 | HR 86 | Ht 70.0 in | Wt 226.0 lb

## 2017-10-21 DIAGNOSIS — Z720 Tobacco use: Secondary | ICD-10-CM

## 2017-10-21 DIAGNOSIS — R0789 Other chest pain: Secondary | ICD-10-CM | POA: Diagnosis not present

## 2017-10-21 DIAGNOSIS — R0602 Shortness of breath: Secondary | ICD-10-CM

## 2017-10-21 DIAGNOSIS — J449 Chronic obstructive pulmonary disease, unspecified: Secondary | ICD-10-CM

## 2017-10-21 DIAGNOSIS — I10 Essential (primary) hypertension: Secondary | ICD-10-CM | POA: Diagnosis not present

## 2017-10-21 NOTE — Patient Instructions (Signed)

## 2017-10-21 NOTE — Progress Notes (Signed)
SUBJECTIVE: The patient presents for follow-up of chest pain and shortness of breath.  I obtain spirometry at his last visit due to his years of tobacco abuse.  He was found to have mild to moderate COPD.  I started Advair diskus.  Echocardiogram in 06/2016 showed normal left ventricular systolic function, LVEF 82-95%, normal regional wall motion, moderate LVH, grade 1 diastolic dysfunction, mild mitral regurgitation.  Nuclear stress test in 06/2016 showed no evidence of myocardial ischemia or scar and was deemed a low risk study.  He said "I feel like a new man".  He thanked me profusely for diagnosing and treating him for his COPD.  He denies chest pain.  He is cut back to smoking 2 cigarettes daily.  He said his wife is very eager for him to stop smoking.      Review of Systems: As per "subjective", otherwise negative.  No Known Allergies  Current Outpatient Medications  Medication Sig Dispense Refill  . amLODipine (NORVASC) 10 MG tablet Take 10 mg by mouth daily.    Marland Kitchen aspirin EC 81 MG tablet Take 1 tablet (81 mg total) by mouth daily.    . calcitRIOL (ROCALTROL) 0.5 MCG capsule Take 1 capsule (0.5 mcg total) by mouth daily. 90 capsule 0  . Fluticasone-Salmeterol (ADVAIR DISKUS) 100-50 MCG/DOSE AEPB Inhale 1 puff 2 (two) times daily into the lungs. 180 each 0  . levothyroxine (SYNTHROID, LEVOTHROID) 175 MCG tablet Take 1 tablet (175 mcg total) by mouth daily before breakfast. 90 tablet 1  . LORazepam (ATIVAN) 1 MG tablet Take 1 mg by mouth at bedtime.    Marland Kitchen losartan (COZAAR) 50 MG tablet Take 1 tablet (50 mg total) by mouth daily. 90 tablet 3  . nitroGLYCERIN (NITROSTAT) 0.4 MG SL tablet Place 1 tablet (0.4 mg total) under the tongue every 5 (five) minutes as needed for chest pain. 25 tablet 3  . traMADol (ULTRAM) 50 MG tablet Take 50 mg by mouth every 6 (six) hours as needed.     No current facility-administered medications for this visit.     Past Medical History:    Diagnosis Date  . Cancer ([place])    Thyroid  . Hypertension   . Insomnia   . Tobacco abuse     Past Surgical History:  Procedure Laterality Date  . THYROIDECTOMY  2015    Social History   Socioeconomic History  . Marital status: Married    Spouse name: Not on file  . Number of children: Not on file  . Years of education: Not on file  . Highest education level: Not on file  Social Needs  . Financial resource strain: Not on file  . Food insecurity - worry: Not on file  . Food insecurity - inability: Not on file  . Transportation needs - medical: Not on file  . Transportation needs - non-medical: Not on file  Occupational History  . Not on file  Tobacco Use  . Smoking status: Current Every Day Smoker    Packs/day: 0.25    Types: Cigarettes  . Smokeless tobacco: Never Used  . Tobacco comment: down to 2 ciggs per day 10/21/17  Substance and Sexual Activity  . Alcohol use: No  . Drug use: No  . Sexual activity: No    Partners: Female  Other Topics Concern  . Not on file  Social History Narrative  . Not on file     Vitals:   10/21/17 1418  BP: 138/70  Pulse: 86  SpO2: 95%  Weight: 226 lb (102.5 kg)  Height: 5\' 10"  (1.778 m)    Wt Readings from Last 3 Encounters:  10/21/17 226 lb (102.5 kg)  08/15/17 228 lb (103.4 kg)  05/19/17 223 lb (101.2 kg)     PHYSICAL EXAM General: NAD HEENT: Normal. Neck: No JVD, no thyromegaly. Lungs: Clear to auscultation bilaterally with normal respiratory effort. CV: Regular rate and rhythm, normal S1/S2, no S3/S4, no murmur. No pretibial or periankle edema. Abdomen: Soft, nontender, no distention.  Neurologic: Alert and oriented.  Psych: Normal affect. Skin: Normal. Musculoskeletal: No gross deformities.    ECG: Most recent ECG reviewed.   Labs: Lab Results  Component Value Date/Time   K 3.9 08/08/2017 09:57 AM   BUN 13 08/08/2017 09:57 AM   CREATININE 1.02 08/08/2017 09:57 AM   ALT 14 08/08/2017 09:57 AM    TSH 0.81 08/08/2017 09:57 AM     Lipids: No results found for: LDLCALC, LDLDIRECT, CHOL, TRIG, HDL     ASSESSMENT AND PLAN: 1. Chest pain and tightness: Symptoms are stable. Continue ASA 81 mg and SL nitro prn. No ischemia or scar on nuclear stress testing. Normal LV systolic function. No further cardiac testing indicated.  2. Shortness of breath and fatigue/mild to moderate COPD:  As stated above, spirometry which I ordered at his last visit demonstrated mild to moderate COPD.  I started Advair diskus.  His symptoms have markedly improved.  He has cut back to smoking 2 cigarettes daily and wants to quit.  3. Hypertension: Controlled.  No changes.  4. Tobacco abuse: He has cut back to smoking 2 cigarettes daily and wants to quit.       Disposition: Follow up 6 months   Kate Sable, M.D., F.A.C.C.

## 2017-10-24 DIAGNOSIS — C44629 Squamous cell carcinoma of skin of left upper limb, including shoulder: Secondary | ICD-10-CM | POA: Diagnosis not present

## 2017-10-24 DIAGNOSIS — L821 Other seborrheic keratosis: Secondary | ICD-10-CM | POA: Diagnosis not present

## 2017-10-24 DIAGNOSIS — B078 Other viral warts: Secondary | ICD-10-CM | POA: Diagnosis not present

## 2017-11-19 ENCOUNTER — Other Ambulatory Visit: Payer: Self-pay | Admitting: Cardiovascular Disease

## 2017-11-24 DIAGNOSIS — Z85828 Personal history of other malignant neoplasm of skin: Secondary | ICD-10-CM | POA: Diagnosis not present

## 2017-11-24 DIAGNOSIS — Z08 Encounter for follow-up examination after completed treatment for malignant neoplasm: Secondary | ICD-10-CM | POA: Diagnosis not present

## 2017-11-24 DIAGNOSIS — L820 Inflamed seborrheic keratosis: Secondary | ICD-10-CM | POA: Diagnosis not present

## 2017-12-08 ENCOUNTER — Other Ambulatory Visit: Payer: Self-pay

## 2017-12-08 MED ORDER — CALCITRIOL 0.5 MCG PO CAPS: 0.5000 ug | ORAL_CAPSULE | Freq: Every day | ORAL | 0 refills | Status: DC

## 2018-01-31 DIAGNOSIS — M19049 Primary osteoarthritis, unspecified hand: Secondary | ICD-10-CM | POA: Diagnosis not present

## 2018-01-31 DIAGNOSIS — Z6830 Body mass index (BMI) 30.0-30.9, adult: Secondary | ICD-10-CM | POA: Diagnosis not present

## 2018-01-31 DIAGNOSIS — J449 Chronic obstructive pulmonary disease, unspecified: Secondary | ICD-10-CM | POA: Diagnosis not present

## 2018-01-31 DIAGNOSIS — J309 Allergic rhinitis, unspecified: Secondary | ICD-10-CM | POA: Diagnosis not present

## 2018-01-31 DIAGNOSIS — Z1389 Encounter for screening for other disorder: Secondary | ICD-10-CM | POA: Diagnosis not present

## 2018-01-31 DIAGNOSIS — Z713 Dietary counseling and surveillance: Secondary | ICD-10-CM | POA: Diagnosis not present

## 2018-01-31 DIAGNOSIS — I10 Essential (primary) hypertension: Secondary | ICD-10-CM | POA: Diagnosis not present

## 2018-02-06 DIAGNOSIS — E890 Postprocedural hypothyroidism: Secondary | ICD-10-CM | POA: Diagnosis not present

## 2018-02-06 LAB — COMPLETE METABOLIC PANEL WITH GFR
AG RATIO: 1.4 (calc) (ref 1.0–2.5)
ALT: 12 U/L (ref 9–46)
AST: 13 U/L (ref 10–35)
Albumin: 4.2 g/dL (ref 3.6–5.1)
Alkaline phosphatase (APISO): 64 U/L (ref 40–115)
BILIRUBIN TOTAL: 0.3 mg/dL (ref 0.2–1.2)
BUN: 15 mg/dL (ref 7–25)
CHLORIDE: 102 mmol/L (ref 98–110)
CO2: 32 mmol/L (ref 20–32)
Calcium: 8.5 mg/dL — ABNORMAL LOW (ref 8.6–10.3)
Creat: 0.85 mg/dL (ref 0.70–1.25)
GFR, EST AFRICAN AMERICAN: 104 mL/min/{1.73_m2} (ref >=60)
GFR, Est Non African American: 90 mL/min/{1.73_m2} (ref >=60)
Globulin: 2.9 g/dL (calc) (ref 1.9–3.7)
Glucose, Bld: 102 mg/dL (ref 65–139)
POTASSIUM: 3.8 mmol/L (ref 3.5–5.3)
Sodium: 140 mmol/L (ref 135–146)
Total Protein: 7.1 g/dL (ref 6.1–8.1)

## 2018-02-07 LAB — VITAMIN D 25 HYDROXY (VIT D DEFICIENCY, FRACTURES): VIT D 25 HYDROXY: 22 ng/mL — AB (ref 30–100)

## 2018-02-07 LAB — TSH: TSH: 0.48 mIU/L (ref 0.40–4.50)

## 2018-02-07 LAB — T4, FREE: FREE T4: 1.9 ng/dL — AB (ref 0.8–1.8)

## 2018-02-13 ENCOUNTER — Encounter: Payer: Self-pay | Admitting: "Endocrinology

## 2018-02-13 ENCOUNTER — Ambulatory Visit: Payer: PPO | Admitting: "Endocrinology

## 2018-02-13 VITALS — BP 157/70 | HR 73 | Ht 70.0 in | Wt 218.0 lb

## 2018-02-13 DIAGNOSIS — E890 Postprocedural hypothyroidism: Secondary | ICD-10-CM

## 2018-02-13 DIAGNOSIS — C73 Malignant neoplasm of thyroid gland: Secondary | ICD-10-CM | POA: Diagnosis not present

## 2018-02-13 MED ORDER — LEVOTHYROXINE SODIUM 150 MCG PO TABS: 150.0000 ug | ORAL_TABLET | Freq: Every day | ORAL | 1 refills | Status: DC

## 2018-02-13 NOTE — Progress Notes (Signed)
Subjective:    Patient ID: Jose Burch, male    DOB: [birth year], PCP Andres Shad, MD   Past Medical History:  Diagnosis Date  . Cancer ([place])    Thyroid  . Hypertension   . Insomnia   . Tobacco abuse    Past Surgical History:  Procedure Laterality Date  . THYROIDECTOMY  2015   Social History   Socioeconomic History  . Marital status: Married    Spouse name: Not on file  . Number of children: Not on file  . Years of education: Not on file  . Highest education level: Not on file  Occupational History  . Not on file  Social Needs  . Financial resource strain: Not on file  . Food insecurity:    Worry: Not on file    Inability: Not on file  . Transportation needs:    Medical: Not on file    Non-medical: Not on file  Tobacco Use  . Smoking status: Current Every Day Smoker    Packs/day: 0.25    Types: Cigarettes  . Smokeless tobacco: Never Used  . Tobacco comment: down to 2 ciggs per day 10/21/17  Substance and Sexual Activity  . Alcohol use: No  . Drug use: No  . Sexual activity: Never    Partners: Female  Lifestyle  . Physical activity:    Days per week: Not on file    Minutes per session: Not on file  . Stress: Not on file  Relationships  . Social connections:    Talks on phone: Not on file    Gets together: Not on file    Attends religious service: Not on file    Active member of club or organization: Not on file    Attends meetings of clubs or organizations: Not on file    Relationship status: Not on file  Other Topics Concern  . Not on file  Social History Narrative  . Not on file   Outpatient Encounter Medications as of 02/13/2018  Medication Sig  . ADVAIR DISKUS 100-50 MCG/DOSE AEPB INHALE 1 PUFF 2 (TWO) TIMES DAILY INTO THE LUNGS  . amLODipine (NORVASC) 10 MG tablet Take 10 mg by mouth daily.  Marland Kitchen aspirin EC 81 MG tablet Take 1 tablet (81 mg total) by mouth daily.  . calcitRIOL (ROCALTROL) 0.5 MCG capsule Take 1 capsule (0.5 mcg  total) by mouth daily.  Marland Kitchen levothyroxine (SYNTHROID, LEVOTHROID) 150 MCG tablet Take 1 tablet (150 mcg total) by mouth daily before breakfast.  . LORazepam (ATIVAN) 1 MG tablet Take 1 mg by mouth at bedtime.  Marland Kitchen losartan (COZAAR) 50 MG tablet Take 1 tablet (50 mg total) by mouth daily.  . nitroGLYCERIN (NITROSTAT) 0.4 MG SL tablet Place 1 tablet (0.4 mg total) under the tongue every 5 (five) minutes as needed for chest pain.  . traMADol (ULTRAM) 50 MG tablet Take 50 mg by mouth every 6 (six) hours as needed.  . [DISCONTINUED] levothyroxine (SYNTHROID, LEVOTHROID) 175 MCG tablet Take 1 tablet (175 mcg total) by mouth daily before breakfast.   No facility-administered encounter medications on file as of 02/13/2018.    ALLERGIES: No Known Allergies  VACCINATION STATUS:  There is no immunization history on file for this patient.  HPI 67 year old man with medical history as above. He is being seen in Follow-up for history of papillary thyroid cancer, post thyroidectomy hypothyroidism.  - He is status post total thyroidectomy in 02/19/2014 followed by remnant ablation with I-131 On  03/22/2014, post therapy scan on 04/01/2014 showing 2 foci of indeterminate activity identified in the suspected neck region.  - His labs from 02/02/2017 showed thyroglobulin level less than 2.0. - He underwent Thyrogen stimulated whole-body scan which was negative for metastatic thyroid cancer- on February 04, 2017.  -  He is on levothyroxine 175 g by mouth every morning. He is compliant with this medication. -He denies any new complaints today.  No heat/cold intolerance.  He denies tremors, palpitations. - His recent labs still  show mild hypocalcemia at 8.5.He denies dysphagia, shortness of breath, voice change. -  He is on calcium carbonate 750 mg p.o. twice daily, and calcitriol 0.5 g daily since the time of his surgery. He reports that surgery was relatively difficult taking  6 hours to complete.   He denies  family history of thyroid cancer nor any thyroid dysfunction. He is a chronic active smoker for the last 15 years. He has hypertension on treatment. -He complains of fatigue, intermittent wheezing.  Review of Systems  Constitutional:  + Lost 8 pounds since last visit, + fatigue, no subjective hyperthermia, no subjective hypothermia Eyes: no blurry vision, no xerophthalmia ENT: no sore throat, no nodules palpated in throat, no dysphagia, no odynophagia, no voice change.   Cardiovascular: no Chest Pain, no Shortness of Breath, no palpitations, no leg swelling Respiratory: + cough, no SOB Gastrointestinal: no Nausea/Vomiting/Diarhhea Musculoskeletal: no muscle/joint aches Skin: no rashes Neurological: no tremors, no numbness, no tingling, no dizziness Psychiatric: no depression, no anxiety  Objective:    BP (!) 157/70   Pulse 73   Ht 5\' 10"  (1.778 m)   Wt 218 lb (98.9 kg)   BMI 31.28 kg/m   Wt Readings from Last 3 Encounters:  02/13/18 218 lb (98.9 kg)  10/21/17 226 lb (102.5 kg)  08/15/17 228 lb (103.4 kg)    Physical Exam  Constitutional: + Obese for height,  not in acute distress, normal state of mind Eyes: PERRLA, EOMI, no exophthalmos ENT: moist mucous membranes, + long horizontal lower neck surgical scar from prior total thyroidectomy/neck dissection , no cervical lymphadenopathy  Musculoskeletal: no gross deformities, strength intact in all four extremities Skin: moist, warm, no rashes Neurological: no tremor with outstretched hands, Deep tendon reflexes normal in all four extremities.   Recent Results (from the past 2160 hour(s))  COMPLETE METABOLIC PANEL WITH GFR     Status: Abnormal   Collection Time: 02/06/18  8:41 AM  Result Value Ref Range   Glucose, Bld 102 65 - 139 mg/dL    Comment: .        Non-fasting reference interval .    BUN 15 7 - 25 mg/dL   Creat 0.85 0.70 - 1.25 mg/dL    Comment: For patients >32 years of age, the reference limit for  Creatinine is approximately 13% higher for people identified as African-American. .    GFR, Est Non African American 90 > OR = 60 mL/min/1.41m2   GFR, Est African American 104 > OR = 60 mL/min/1.32m2   BUN/Creatinine Ratio NOT APPLICABLE 6 - 22 (calc)   Sodium 140 135 - 146 mmol/L   Potassium 3.8 3.5 - 5.3 mmol/L   Chloride 102 98 - 110 mmol/L   CO2 32 20 - 32 mmol/L   Calcium 8.5 (L) 8.6 - 10.3 mg/dL   Total Protein 7.1 6.1 - 8.1 g/dL   Albumin 4.2 3.6 - 5.1 g/dL   Globulin 2.9 1.9 - 3.7 g/dL (calc)   AG  Ratio 1.4 1.0 - 2.5 (calc)   Total Bilirubin 0.3 0.2 - 1.2 mg/dL   Alkaline phosphatase (APISO) 64 40 - 115 U/L   AST 13 10 - 35 U/L   ALT 12 9 - 46 U/L  VITAMIN D 25 Hydroxy (Vit-D Deficiency, Fractures)     Status: Abnormal   Collection Time: 02/06/18  8:41 AM  Result Value Ref Range   Vit D, 25-Hydroxy 22 (L) 30 - 100 ng/mL    Comment: Vitamin D Status         25-OH Vitamin D: . Deficiency:                    <20 ng/mL Insufficiency:             20 - 29 ng/mL Optimal:                 > or = 30 ng/mL . For 25-OH Vitamin D testing on patients on  D2-supplementation and patients for whom quantitation  of D2 and D3 fractions is required, the [employer]ureD(TM) 25-OH VIT D, (D2,D3), LC/MS/MS is recommended: order  code 914 112 0720 (patients >52yrs). . For more information on this test, go to: http://education.questdiagnostics.com/faq/FAQ163 (This link is being provided for  informational/educational purposes only.)   TSH     Status: None   Collection Time: 02/06/18  8:41 AM  Result Value Ref Range   TSH 0.48 0.40 - 4.50 mIU/L  T4, free     Status: Abnormal   Collection Time: 02/06/18  8:41 AM  Result Value Ref Range   Free T4 1.9 (H) 0.8 - 1.8 ng/dL     Assessment & Plan:   1. Malignant neoplasm of thyroid gland ([place]) 2. Postsurgical hypothyroidism  -  He underwent total thyroidectomy on 02/19/2014 followed by thyroid remnant ablation with I-131 on 03/22/2014, was  therapy whole-body scan on 04/01/2014 showed 2 foci of indeterminate activity identified in the neck region. No subsequent imaging studies for him. - Thyrogen stimulated whole-body scan - no evidence of metastatic thyroid cancer- done on 02/04/2017 - He will need thyroid/neck ultrasound in 1 year. -  His repeat thyroid function test are consistent with over replacement with levothyroxine. -I discussed and lowered his levothyroxine to 150 mcg p.o. nightly.     - We discussed about correct intake of levothyroxine, at fasting, with water, separated by at least 30 minutes from breakfast, and separated by more than 4 hours from calcium, iron, multivitamins, acid reflux medications (PPIs). -Patient is made aware of the fact that thyroid hormone replacement is needed for life, dose to be adjusted by periodic monitoring of thyroid function tests.   3. Hypocalcemia - Likely a result of partial surgical hypoparathyroidism. I have advised him to be consistent on his calcitriol 0.5 g by mouth daily , and advised him to increase calcium carbonate 750 mg by mouth 3 times a day with meals.   - I advised patient to maintain close follow up with Andres Shad, MD for primary care needs.  Follow up plan: Return in about 6 months (around 08/16/2018).  Glade Lloyd, MD Phone: [phone]  Fax: [phone]  -  This note was partially dictated with voice recognition software. Similar sounding words can be transcribed inadequately or may not  be corrected upon review.  02/13/2018, 11:17 AM

## 2018-03-01 ENCOUNTER — Other Ambulatory Visit: Payer: Self-pay | Admitting: "Endocrinology

## 2018-03-22 DIAGNOSIS — I10 Essential (primary) hypertension: Secondary | ICD-10-CM | POA: Diagnosis not present

## 2018-03-22 DIAGNOSIS — Z6830 Body mass index (BMI) 30.0-30.9, adult: Secondary | ICD-10-CM | POA: Diagnosis not present

## 2018-03-22 DIAGNOSIS — R609 Edema, unspecified: Secondary | ICD-10-CM | POA: Diagnosis not present

## 2018-04-10 ENCOUNTER — Emergency Department (HOSPITAL_COMMUNITY): Payer: PPO

## 2018-04-10 ENCOUNTER — Other Ambulatory Visit: Payer: Self-pay

## 2018-04-10 ENCOUNTER — Encounter (HOSPITAL_COMMUNITY): Payer: Self-pay | Admitting: Emergency Medicine

## 2018-04-10 ENCOUNTER — Emergency Department (HOSPITAL_COMMUNITY)
Admission: EM | Admit: 2018-04-10 | Discharge: 2018-04-10 | Disposition: A | Discharge status: Home / Self Care | Payer: PPO | Attending: Emergency Medicine | Admitting: Emergency Medicine

## 2018-04-10 ENCOUNTER — Telehealth: Payer: Self-pay

## 2018-04-10 DIAGNOSIS — Z79899 Other long term (current) drug therapy: Secondary | ICD-10-CM | POA: Insufficient documentation

## 2018-04-10 DIAGNOSIS — R600 Localized edema: Secondary | ICD-10-CM | POA: Diagnosis not present

## 2018-04-10 DIAGNOSIS — I10 Essential (primary) hypertension: Secondary | ICD-10-CM | POA: Insufficient documentation

## 2018-04-10 DIAGNOSIS — Z85850 Personal history of malignant neoplasm of thyroid: Secondary | ICD-10-CM | POA: Diagnosis not present

## 2018-04-10 DIAGNOSIS — M7989 Other specified soft tissue disorders: Secondary | ICD-10-CM | POA: Diagnosis not present

## 2018-04-10 DIAGNOSIS — H538 Other visual disturbances: Secondary | ICD-10-CM | POA: Diagnosis not present

## 2018-04-10 DIAGNOSIS — Z7982 Long term (current) use of aspirin: Secondary | ICD-10-CM | POA: Diagnosis not present

## 2018-04-10 DIAGNOSIS — R079 Chest pain, unspecified: Secondary | ICD-10-CM | POA: Diagnosis not present

## 2018-04-10 DIAGNOSIS — R0789 Other chest pain: Secondary | ICD-10-CM | POA: Diagnosis not present

## 2018-04-10 DIAGNOSIS — F17210 Nicotine dependence, cigarettes, uncomplicated: Secondary | ICD-10-CM | POA: Insufficient documentation

## 2018-04-10 DIAGNOSIS — R072 Precordial pain: Secondary | ICD-10-CM | POA: Insufficient documentation

## 2018-04-10 DIAGNOSIS — M542 Cervicalgia: Secondary | ICD-10-CM | POA: Insufficient documentation

## 2018-04-10 DIAGNOSIS — R220 Localized swelling, mass and lump, head: Secondary | ICD-10-CM | POA: Diagnosis not present

## 2018-04-10 HISTORY — DX: Disorder of thyroid, unspecified: E07.9

## 2018-04-10 LAB — CBC WITH DIFFERENTIAL/PLATELET
Basophils Absolute: 0.1 10*3/uL (ref 0.0–0.1)
Basophils Relative: 0 %
EOS ABS: 0.3 10*3/uL (ref 0.0–0.7)
EOS PCT: 2 %
HEMATOCRIT: 42.6 % (ref 39.0–52.0)
Hemoglobin: 14.1 g/dL (ref 13.0–17.0)
LYMPHS ABS: 3.3 10*3/uL (ref 0.7–4.0)
Lymphocytes Relative: 24 %
MCH: 28.4 pg (ref 26.0–34.0)
MCHC: 33.1 g/dL (ref 30.0–36.0)
MCV: 85.9 fL (ref 78.0–100.0)
MONOS PCT: 9 %
Monocytes Absolute: 1.2 10*3/uL (ref 0.1–1.0)
Neutro Abs: 9.2 10*3/uL (ref 1.7–7.7)
Neutrophils Relative %: 65 %
PLATELETS: 338 10*3/uL (ref 150–400)
RBC: 4.96 MIL/uL (ref 4.22–5.81)
RDW: 14.8 % (ref 11.5–15.5)
WBC: 14 10*3/uL — ABNORMAL HIGH (ref 4.0–10.5)

## 2018-04-10 LAB — HEPATIC FUNCTION PANEL
ALBUMIN: 3.7 g/dL (ref 3.5–5.0)
ALT: 23 U/L (ref 0–44)
AST: 21 U/L (ref 15–41)
Alkaline Phosphatase: 61 U/L (ref 38–126)
Bilirubin, Direct: <0.1 mg/dL (ref 0.0–0.2)
TOTAL PROTEIN: 7.4 g/dL (ref 6.5–8.1)
Total Bilirubin: 0.4 mg/dL (ref 0.3–1.2)

## 2018-04-10 LAB — BASIC METABOLIC PANEL
Anion gap: 9 (ref 5–15)
BUN: 18 mg/dL (ref 8–23)
CALCIUM: 7.9 mg/dL — AB (ref 8.9–10.3)
CO2: 27 mmol/L (ref 22–32)
CREATININE: 1.08 mg/dL (ref 0.61–1.24)
Chloride: 102 mmol/L (ref 98–111)
GFR calc Af Amer: >60 mL/min (ref >60)
GFR calc non Af Amer: >60 mL/min (ref >60)
GLUCOSE: 93 mg/dL (ref 70–99)
Potassium: 4 mmol/L (ref 3.5–5.1)
Sodium: 138 mmol/L (ref 135–145)

## 2018-04-10 LAB — TROPONIN I
Troponin I: <0.03 ng/mL (ref <0.03)
Troponin I: <0.03 ng/mL (ref <0.03)

## 2018-04-10 LAB — CBG MONITORING, ED: GLUCOSE-CAPILLARY: 105 mg/dL — AB (ref 70–99)

## 2018-04-10 LAB — BRAIN NATRIURETIC PEPTIDE: B NATRIURETIC PEPTIDE 5: 61 pg/mL (ref 0.0–100.0)

## 2018-04-10 LAB — LIPASE, BLOOD: LIPASE: 52 U/L — AB (ref 11–51)

## 2018-04-10 MED ORDER — IOPAMIDOL (ISOVUE-370) INJECTION 76%
100.0000 mL | Freq: Once | INTRAVENOUS | Status: AC | PRN
  Administered 2018-04-10: 100 mL via INTRAVENOUS

## 2018-04-10 NOTE — Discharge Instructions (Addendum)
Cardiac work-up negative.  2 troponins negative.  CT of the chest without evidence of pneumonia or fluid on the lungs or blood clot in the lungs.  Make an appointment to follow-up with your regular doctor.  Return for any new or worse symptoms.

## 2018-04-10 NOTE — ED Triage Notes (Signed)
Pt c/o left chest tightness radiating into left neck intemittent since last night. C/o feet feeling like they are on fire with swelling. No obvious swelling noted.

## 2018-04-10 NOTE — ED Provider Notes (Addendum)
[facility] EMERGENCY DEPARTMENT Provider Note   CSN: [id number] Arrival date & time: 04/10/18  1624     History   Chief Complaint Chief Complaint  Patient presents with  . Chest Pain    HPI Jose Burch is a 67 y.o. male.  Patient with complaint of left chest left neck pain starting yesterday.  Somewhat intermittent but been there not.  Also has felt disease had his if he has had increased body swelling for about 3 months.  In addition patient works as a Administrator.  He states that his vision has set times intermittently been abnormal.  Denies any headache.  Patient has had thyroid surgery for thyroid cancer in the past and is on thyroid supplements.  He is followed by endocrinology here in [place].     Past Medical History:  Diagnosis Date  . Cancer ([place])    Thyroid  . Hypertension   . Insomnia   . Thyroid disease   . Tobacco abuse     Patient Active Problem List   Diagnosis Date Noted  . Hypocalcemia 08/15/2017  . Malignant neoplasm of thyroid gland ([place]) 01/13/2017  . Postsurgical hypothyroidism 01/13/2017    Past Surgical History:  Procedure Laterality Date  . THYROIDECTOMY  2015        Home Medications    Prior to Admission medications   Medication Sig Start Date End Date Taking? Authorizing Provider  ADVAIR DISKUS 100-50 MCG/DOSE AEPB INHALE 1 PUFF 2 (TWO) TIMES DAILY INTO THE LUNGS 11/21/17  Yes Herminio Commons, MD  amLODipine (NORVASC) 10 MG tablet Take 10 mg by mouth daily.   Yes [provider]  aspirin EC 81 MG tablet Take 1 tablet (81 mg total) by mouth daily. 06/24/16  Yes Herminio Commons, MD  calcitRIOL (ROCALTROL) 0.5 MCG capsule TAKE 1 CAPSULE (0.5 MCG TOTAL) BY MOUTH DAILY. 03/02/18  Yes Nida, Marella Chimes, MD  calcium carbonate (TUMS EX) 750 MG chewable tablet Chew 1 tablet by mouth 3 (three) times daily with meals.   Yes [provider]  furosemide (LASIX) 20 MG tablet Take 20 mg by mouth daily. 03/22/18   Yes [provider]  levothyroxine (SYNTHROID, LEVOTHROID) 150 MCG tablet Take 1 tablet (150 mcg total) by mouth daily before breakfast. 02/13/18  Yes Nida, Marella Chimes, MD  LORazepam (ATIVAN) 2 MG tablet Take 1 tablet by mouth at bedtime. 03/13/18  Yes [provider]  losartan (COZAAR) 50 MG tablet Take 1 tablet (50 mg total) by mouth daily. 08/05/17 04/10/18 Yes Herminio Commons, MD  nitroGLYCERIN (NITROSTAT) 0.4 MG SL tablet Place 1 tablet (0.4 mg total) under the tongue every 5 (five) minutes as needed for chest pain. 06/24/16  Yes Herminio Commons, MD  traMADol (ULTRAM) 50 MG tablet Take 50 mg by mouth every 6 (six) hours as needed.   Yes [provider]    Family History Family History  Problem Relation Age of Onset  . Cancer Mother   . Heart attack Sister     Social History Social History   Tobacco Use  . Smoking status: Current Every Day Smoker    Packs/day: 0.25    Types: Cigarettes  . Smokeless tobacco: Never Used  . Tobacco comment: down to 2 ciggs per day 10/21/17  Substance Use Topics  . Alcohol use: No  . Drug use: No     Allergies   Patient has no known allergies.   Review of Systems Review of Systems  Constitutional: Negative for fever.  HENT: Negative for congestion.   Eyes: Positive for visual disturbance. Negative for photophobia, pain and redness.  Respiratory: Negative for shortness of breath.   Cardiovascular: Positive for chest pain and leg swelling.  Gastrointestinal: Negative for abdominal pain, nausea and vomiting.  Genitourinary: Negative for dysuria.  Musculoskeletal: Negative for back pain.  Neurological: Negative for headaches.  Hematological: Does not bruise/bleed easily.  Psychiatric/Behavioral: Negative for confusion.     Physical Exam Updated Vital Signs BP 136/79   Pulse 70   Temp 98.1 F (36.7 C) (Oral)   Resp 15   Ht 1.778 m (5\' 10" )   Wt 98.9 kg (218 lb)   SpO2 97%   BMI 31.28 kg/m    Physical Exam  Constitutional: He is oriented to person, place, and time. He appears well-developed and well-nourished. No distress.  HENT:  Head: Normocephalic and atraumatic.  Mouth/Throat: Oropharynx is clear and moist.  Eyes: Pupils are equal, round, and reactive to light. Conjunctivae and EOM are normal.  Neck: Normal range of motion. Neck supple.  Cardiovascular: Normal rate, regular rhythm and normal heart sounds.  Pulmonary/Chest: Effort normal and breath sounds normal. No respiratory distress.  Abdominal: Soft. Bowel sounds are normal.  Musculoskeletal: Normal range of motion. He exhibits no edema.  Neurological: He is alert and oriented to person, place, and time. No cranial nerve deficit or sensory deficit. He exhibits normal muscle tone. Coordination normal.  Skin: Skin is warm.  Nursing note and vitals reviewed.    ED Treatments / Results  Labs (all labs ordered are listed, but only abnormal results are displayed) Labs Reviewed  BASIC METABOLIC PANEL - Abnormal; Notable for the following components:      Result Value   Calcium 7.9 (*)    All other components within normal limits  CBC WITH DIFFERENTIAL/PLATELET - Abnormal; Notable for the following components:   WBC 14.0 (*)    All other components within normal limits  LIPASE, BLOOD - Abnormal; Notable for the following components:   Lipase 52 (*)    All other components within normal limits  CBG MONITORING, ED - Abnormal; Notable for the following components:   Glucose-Capillary 105 (*)    All other components within normal limits  TROPONIN I  BRAIN NATRIURETIC PEPTIDE  HEPATIC FUNCTION PANEL  TROPONIN I    EKG EKG Interpretation  Date/Time:  Monday April 10 2018 16:59:29 EDT Ventricular Rate:  78 PR Interval:  156 QRS Duration: 80 QT Interval:  374 QTC Calculation: 426 R Axis:   -89 Text Interpretation:  Normal sinus rhythm Left axis deviation Abnormal ECG no previouos Interpretation limited  secondary to artifact Confirmed by Fredia Sorrow [phone]) on 04/10/2018 5:44:38 PM   Radiology Dg Chest 2 View  Result Date: 04/10/2018 CLINICAL DATA:  Chest pain. EXAM: CHEST - 2 VIEW COMPARISON:  Chest x-ray dated June 25, 2016. FINDINGS: The heart size and mediastinal contours are within normal limits. Mild pulmonary vascular congestion. No focal consolidation, pleural effusion, or pneumothorax. No acute osseous abnormality. IMPRESSION: Mild pulmonary vascular congestion. Electronically Signed   By: Titus Dubin M.D.   On: 04/10/2018 17:47   Ct Head Wo Contrast  Result Date: 04/10/2018 CLINICAL DATA:  67 year old male with LEFT neck and facial swelling with chest pain. EXAM: CT HEAD WITHOUT CONTRAST TECHNIQUE: Contiguous axial images were obtained from the base of the skull through the vertex without intravenous contrast. COMPARISON:  None. FINDINGS: Brain: No evidence of acute infarction, hemorrhage, hydrocephalus,  extra-axial collection or mass lesion/mass effect. Mild probable chronic small-vessel white matter ischemic changes noted. Vascular: No hyperdense vessel or unexpected calcification. Skull: Normal. Negative for fracture or focal lesion. Sinuses/Orbits: No acute finding. Other: None. IMPRESSION: No evidence of acute intracranial abnormality. Electronically Signed   By: Margarette Canada M.D.   On: 04/10/2018 20:10   Ct Angio Chest Pe W/cm &/or Wo Cm  Result Date: 04/10/2018 CLINICAL DATA:  Left-sided chest tightness radiating to the left neck EXAM: CT ANGIOGRAPHY CHEST WITH CONTRAST TECHNIQUE: Multidetector CT imaging of the chest was performed using the standard protocol during bolus administration of intravenous contrast. Multiplanar CT image reconstructions and MIPs were obtained to evaluate the vascular anatomy. CONTRAST:  124mL ISOVUE-370 IOPAMIDOL (ISOVUE-370) INJECTION 76% COMPARISON:  Chest x-ray 04/10/2018 FINDINGS: Cardiovascular: Satisfactory opacification of the pulmonary  arteries to the segmental level. No evidence of pulmonary embolism. Normal heart size. No pericardial effusion. Nonaneurysmal aorta. Moderate aortic atherosclerosis. No dissection. Coronary vascular calcification. Mediastinum/Nodes: Midline trachea. No thyroid mass. Borderline AP window lymph node measuring 1 cm. Esophagus within normal limits except for small distal hernia. Lungs/Pleura: Mild apical emphysema. No acute airspace disease, pleural effusion or pneumothorax Upper Abdomen: Subcentimeter hypodense liver lesions too small to further characterize. Nodular appearance of both adrenal glands. Musculoskeletal: No chest wall abnormality. No acute or significant osseous findings. Review of the MIP images confirms the above findings. IMPRESSION: 1. Negative for acute pulmonary embolus or aortic dissection 2. Mild emphysema.  No acute airspace disease. Aortic Atherosclerosis (ICD10-I70.0) and Emphysema (ICD10-J43.9). Electronically Signed   By: Donavan Foil M.D.   On: 04/10/2018 20:11    Procedures Procedures (including critical care time)  Medications Ordered in ED Medications  iopamidol (ISOVUE-370) 76 % injection 100 mL (100 mLs Intravenous Contrast Given 04/10/18 1941)     Initial Impression / Assessment and Plan / ED Course  I have reviewed the triage vital signs and the nursing notes.  Pertinent labs & imaging results that were available during my care of the patient were reviewed by me and considered in my medical decision making (see chart for details).     Extensive work-up for the chest pain initial troponin negative.  Delta troponin pending.  Chest x-ray raise some concerns for perhaps some vascular congestion.  CT angios done negative for PE shows no evidence of any pulmonary edema.  Patient is a smoker does show some emphysema type changes.  Patient's labs other than a leukocytosis without any significant abnormalities.  Renal functions normal.  BMP was not elevated.  If second  troponin is negative patient can be discharged home with close follow-up with his doctors.  Patient nontoxic no acute distress.  In addition EKG without any acute changes.  Did have left axis deviation.  Second troponin was negative.  Patient cleared for discharge home.  Final Clinical Impressions(s) / ED Diagnoses   Final diagnoses:  Precordial pain    ED Discharge Orders    None       Fredia Sorrow, MD 04/10/18 2100    Fredia Sorrow, MD 04/10/18 4010    Fredia Sorrow, MD 04/10/18 2118

## 2018-04-10 NOTE — Telephone Encounter (Signed)
Patient contacted office stating he was having increased swelling in feet and they were burning. Patient also stated he was having severe chest pressure where he felt like he was going to explode. Advised patient he needed to go straight to the ER at [facility]. Patient verbalized understanding and would have wife drive.

## 2018-05-12 ENCOUNTER — Other Ambulatory Visit: Payer: Self-pay | Admitting: Cardiovascular Disease

## 2018-05-28 ENCOUNTER — Other Ambulatory Visit: Payer: Self-pay | Admitting: "Endocrinology

## 2018-06-19 DIAGNOSIS — R7303 Prediabetes: Secondary | ICD-10-CM | POA: Diagnosis not present

## 2018-06-26 DIAGNOSIS — Z6830 Body mass index (BMI) 30.0-30.9, adult: Secondary | ICD-10-CM | POA: Diagnosis not present

## 2018-06-26 DIAGNOSIS — R05 Cough: Secondary | ICD-10-CM | POA: Diagnosis not present

## 2018-06-26 DIAGNOSIS — J449 Chronic obstructive pulmonary disease, unspecified: Secondary | ICD-10-CM | POA: Diagnosis not present

## 2018-06-26 DIAGNOSIS — I10 Essential (primary) hypertension: Secondary | ICD-10-CM | POA: Diagnosis not present

## 2018-06-26 DIAGNOSIS — Z713 Dietary counseling and surveillance: Secondary | ICD-10-CM | POA: Diagnosis not present

## 2018-07-27 DIAGNOSIS — L700 Acne vulgaris: Secondary | ICD-10-CM | POA: Diagnosis not present

## 2018-07-27 DIAGNOSIS — Z08 Encounter for follow-up examination after completed treatment for malignant neoplasm: Secondary | ICD-10-CM | POA: Diagnosis not present

## 2018-07-27 DIAGNOSIS — Z85828 Personal history of other malignant neoplasm of skin: Secondary | ICD-10-CM | POA: Diagnosis not present

## 2018-07-27 DIAGNOSIS — L570 Actinic keratosis: Secondary | ICD-10-CM | POA: Diagnosis not present

## 2018-07-27 DIAGNOSIS — C44311 Basal cell carcinoma of skin of nose: Secondary | ICD-10-CM | POA: Diagnosis not present

## 2018-07-28 ENCOUNTER — Other Ambulatory Visit: Payer: Self-pay | Admitting: "Endocrinology

## 2018-07-31 ENCOUNTER — Other Ambulatory Visit: Payer: Self-pay | Admitting: Cardiovascular Disease

## 2018-08-09 DIAGNOSIS — E890 Postprocedural hypothyroidism: Secondary | ICD-10-CM | POA: Diagnosis not present

## 2018-08-09 LAB — COMPLETE METABOLIC PANEL WITH GFR
AG Ratio: 1.6 (calc) (ref 1.0–2.5)
ALBUMIN MSPROF: 4 g/dL (ref 3.6–5.1)
ALT: 12 U/L (ref 9–46)
AST: 15 U/L (ref 10–35)
Alkaline phosphatase (APISO): 55 U/L (ref 40–115)
BUN: 13 mg/dL (ref 7–25)
CO2: 27 mmol/L (ref 20–32)
CREATININE: 0.96 mg/dL (ref 0.70–1.25)
Calcium: 7.3 mg/dL — ABNORMAL LOW (ref 8.6–10.3)
Chloride: 105 mmol/L (ref 98–110)
GFR, EST AFRICAN AMERICAN: 94 mL/min/{1.73_m2} (ref >=60)
GFR, EST NON AFRICAN AMERICAN: 81 mL/min/{1.73_m2} (ref >=60)
GLOBULIN: 2.5 g/dL (ref 1.9–3.7)
GLUCOSE: 110 mg/dL (ref 65–139)
Potassium: 4 mmol/L (ref 3.5–5.3)
SODIUM: 139 mmol/L (ref 135–146)
TOTAL PROTEIN: 6.5 g/dL (ref 6.1–8.1)
Total Bilirubin: 0.3 mg/dL (ref 0.2–1.2)

## 2018-08-09 LAB — TSH: TSH: 4.86 mIU/L — ABNORMAL HIGH (ref 0.40–4.50)

## 2018-08-09 LAB — T4, FREE: Free T4: 1.3 ng/dL (ref 0.8–1.8)

## 2018-08-16 ENCOUNTER — Ambulatory Visit: Payer: PPO | Admitting: "Endocrinology

## 2018-08-16 ENCOUNTER — Encounter: Payer: Self-pay | Admitting: "Endocrinology

## 2018-08-16 VITALS — BP 125/80 | HR 79 | Ht 70.0 in | Wt 229.0 lb

## 2018-08-16 DIAGNOSIS — E890 Postprocedural hypothyroidism: Secondary | ICD-10-CM | POA: Diagnosis not present

## 2018-08-16 DIAGNOSIS — C73 Malignant neoplasm of thyroid gland: Secondary | ICD-10-CM | POA: Diagnosis not present

## 2018-08-16 MED ORDER — LEVOTHYROXINE SODIUM 175 MCG PO TABS: 175.0000 ug | ORAL_TABLET | Freq: Every day | ORAL | 6 refills | Status: DC

## 2018-08-16 MED ORDER — CALCITRIOL 0.5 MCG PO CAPS: 0.5000 ug | ORAL_CAPSULE | Freq: Every day | ORAL | 2 refills | Status: DC

## 2018-08-16 MED ORDER — CALCIUM CARBONATE ANTACID 750 MG PO CHEW: 1.0000 | CHEWABLE_TABLET | Freq: Three times a day (TID) | ORAL | 3 refills | Status: AC

## 2018-08-16 NOTE — Progress Notes (Signed)
Endocrinology follow-up note   Subjective:    Patient ID: Jose Burch, male    DOB: 1950-12-19, PCP Andres Shad, MD   Past Medical History:  Diagnosis Date  . Cancer ([place])    Thyroid  . Hypertension   . Insomnia   . Thyroid disease   . Tobacco abuse    Past Surgical History:  Procedure Laterality Date  . THYROIDECTOMY  2015   Social History   Socioeconomic History  . Marital status: Married    Spouse name: Not on file  . Number of children: Not on file  . Years of education: Not on file  . Highest education level: Not on file  Occupational History  . Not on file  Social Needs  . Financial resource strain: Not on file  . Food insecurity:    Worry: Not on file    Inability: Not on file  . Transportation needs:    Medical: Not on file    Non-medical: Not on file  Tobacco Use  . Smoking status: Current Every Day Smoker    Packs/day: 0.25    Types: Cigarettes  . Smokeless tobacco: Never Used  . Tobacco comment: down to 2 ciggs per day 10/21/17  Substance and Sexual Activity  . Alcohol use: No  . Drug use: No  . Sexual activity: Never    Partners: Female  Lifestyle  . Physical activity:    Days per week: Not on file    Minutes per session: Not on file  . Stress: Not on file  Relationships  . Social connections:    Talks on phone: Not on file    Gets together: Not on file    Attends religious service: Not on file    Active member of club or organization: Not on file    Attends meetings of clubs or organizations: Not on file    Relationship status: Not on file  Other Topics Concern  . Not on file  Social History Narrative  . Not on file   Outpatient Encounter Medications as of 08/17/1967  Medication Sig  . ADVAIR DISKUS 100-50 MCG/DOSE AEPB INHALE 1 PUFF 2 (TWO) TIMES DAILY INTO THE LUNGS  . amLODipine (NORVASC) 10 MG tablet Take 10 mg by mouth daily.  Marland Kitchen aspirin EC 81 MG tablet Take 1 tablet (81 mg total) by mouth daily.  . calcitRIOL  (ROCALTROL) 0.5 MCG capsule Take 1 capsule (0.5 mcg total) by mouth daily.  . calcium carbonate (TUMS EX) 750 MG chewable tablet Chew 1 tablet (750 mg total) by mouth 3 (three) times daily with meals.  Marland Kitchen levothyroxine (SYNTHROID, LEVOTHROID) 175 MCG tablet Take 1 tablet (175 mcg total) by mouth daily before breakfast.  . LORazepam (ATIVAN) 2 MG tablet Take 1 tablet by mouth at bedtime.  Marland Kitchen losartan (COZAAR) 50 MG tablet TAKE 1 TABLET BY MOUTH EVERY DAY  . traMADol (ULTRAM) 50 MG tablet Take 50 mg by mouth every 6 (six) hours as needed.  . [DISCONTINUED] calcitRIOL (ROCALTROL) 0.5 MCG capsule TAKE 1 CAPSULE (0.5 MCG TOTAL) BY MOUTH DAILY.  . [DISCONTINUED] calcium carbonate (TUMS EX) 750 MG chewable tablet Chew 1 tablet by mouth 3 (three) times daily with meals.  . [DISCONTINUED] furosemide (LASIX) 20 MG tablet Take 20 mg by mouth daily.  . [DISCONTINUED] levothyroxine (SYNTHROID, LEVOTHROID) 150 MCG tablet TAKE 1 TABLET (150 MCG TOTAL) BY MOUTH DAILY BEFORE BREAKFAST.  . [DISCONTINUED] nitroGLYCERIN (NITROSTAT) 0.4 MG SL tablet Place 1 tablet (0.4 mg total)  under the tongue every 5 (five) minutes as needed for chest pain.   No facility-administered encounter medications on file as of 08/17/1967.    ALLERGIES: No Known Allergies  VACCINATION STATUS:  There is no immunization history on file for this patient.  HPI 67 year old man with medical history as above. He is being seen in Follow-up for history of papillary thyroid cancer, post thyroidectomy hypothyroidism.  - He is status post total thyroidectomy in 02/19/2014 followed by remnant ablation with I-131 On 03/22/2014, post therapy scan on 04/01/2014 showing 2 foci of indeterminate activity identified in the suspected neck region.  - His labs from 02/02/2017 showed thyroglobulin level less than 2.0. - He underwent Thyrogen stimulated whole-body scan which was negative for metastatic thyroid cancer- on February 04, 2017.  -  He is on  levothyroxine 150 g by mouth every morning. He is compliant with this medication. -He denies any new complaints today.  No heat/cold intolerance.  He denies tremors, palpitations.  He is gaining weight. - His recent labs still  show mild hypocalcemia at 7.3.  He admits to inconsistency in taking his calcium, taking it only one time instead of 3 times recommended.  He denies dysphagia, shortness of breath, voice change. -  He was supposed to be on calcium carbonate 750 mg p.o. 3 x  daily, and calcitriol 0.5 g daily since the time of his surgery. He reports that surgery was relatively difficult taking  6 hours to complete.   He denies family history of thyroid cancer nor any thyroid dysfunction. He is a chronic active smoker for the last 15 years. He has hypertension on treatment. -He complains of fatigue, intermittent wheezing.  Review of Systems  Constitutional:  + Gained 11 pounds since last visit,  + fatigue, no subjective hyperthermia, no subjective hypothermia Eyes: no blurry vision, no xerophthalmia ENT: no sore throat, no nodules palpated in throat, no dysphagia, no odynophagia, no voice change.   Cardiovascular: no Chest Pain, no Shortness of Breath, no palpitations, no leg swelling Respiratory: + cough, no SOB Gastrointestinal: no Nausea/Vomiting/Diarhhea Musculoskeletal: no muscle/joint aches Skin: no rashes Neurological: no tremors, no numbness, no tingling, no dizziness Psychiatric: no depression, no anxiety  Objective:    BP 125/80   Pulse 79   Ht 5\' 10"  (1.778 m)   Wt 229 lb (103.9 kg)   BMI 32.86 kg/m   Wt Readings from Last 3 Encounters:  08/16/18 229 lb (103.9 kg)  04/10/18 218 lb (98.9 kg)  02/13/18 218 lb (98.9 kg)    Physical Exam  Constitutional: + Obese for height,  not in acute distress, normal state of mind Eyes: PERRLA, EOMI, no exophthalmos ENT: moist mucous membranes, + long horizontal lower neck surgical scar from prior total thyroidectomy/neck  dissection , no cervical lymphadenopathy  Musculoskeletal: no gross deformities, strength intact in all four extremities Skin: moist, warm, no rashes Neurological: no tremor with outstretched hands, Deep tendon reflexes normal in all four extremities.   Recent Results (from the past 2160 hour(s))  TSH     Status: Abnormal   Collection Time: 08/09/18  9:49 AM  Result Value Ref Range   TSH 4.86 (H) 0.40 - 4.50 mIU/L  T4, free     Status: None   Collection Time: 08/09/18  9:49 AM  Result Value Ref Range   Free T4 1.3 0.8 - 1.8 ng/dL  COMPLETE METABOLIC PANEL WITH GFR     Status: Abnormal   Collection Time: 08/09/18  9:49 AM  Result Value Ref Range   Glucose, Bld 110 65 - 139 mg/dL    Comment: .        Non-fasting reference interval .    BUN 13 7 - 25 mg/dL   Creat 0.96 0.70 - 1.25 mg/dL    Comment: For patients >35 years of age, the reference limit for Creatinine is approximately 13% higher for people identified as African-American. .    GFR, Est Non African American 81 > OR = 60 mL/min/1.85m2   GFR, Est African American 94 > OR = 60 mL/min/1.16m2   BUN/Creatinine Ratio NOT APPLICABLE 6 - 22 (calc)   Sodium 139 135 - 146 mmol/L   Potassium 4.0 3.5 - 5.3 mmol/L   Chloride 105 98 - 110 mmol/L   CO2 27 20 - 32 mmol/L   Calcium 7.3 (L) 8.6 - 10.3 mg/dL   Total Protein 6.5 6.1 - 8.1 g/dL   Albumin 4.0 3.6 - 5.1 g/dL   Globulin 2.5 1.9 - 3.7 g/dL (calc)   AG Ratio 1.6 1.0 - 2.5 (calc)   Total Bilirubin 0.3 0.2 - 1.2 mg/dL   Alkaline phosphatase (APISO) 55 40 - 115 U/L   AST 15 10 - 35 U/L   ALT 12 9 - 46 U/L     Assessment & Plan:   1. Malignant neoplasm of thyroid gland ([place]) 2. Postsurgical hypothyroidism  -  He underwent total thyroidectomy on 02/19/2014 followed by thyroid remnant ablation with I-131 on 03/22/2014, was therapy whole-body scan on 04/01/2014 showed 2 foci of indeterminate activity identified in the neck region. No subsequent imaging studies for him. -  Thyrogen stimulated whole-body scan - no evidence of metastatic thyroid cancer- done on 02/04/2017 - He will need thyroid/neck ultrasound before his next visit in 6 months.  -  His repeat thyroid function test are consistent with under replacement with levothyroxine.    -I discussed and increased his levothyroxine to 175 mcg p.o. every morning.  - We discussed about correct intake of levothyroxine, at fasting, with water, separated by at least 30 minutes from breakfast, and separated by more than 4 hours from calcium, iron, multivitamins, acid reflux medications (PPIs). -Patient is made aware of the fact that thyroid hormone replacement is needed for life, dose to be adjusted by periodic monitoring of thyroid function tests.    3. Hypocalcemia - Likely a result of partial surgical hypoparathyroidism.  He is serum calcium level is suboptimal at 7.3, due to his inconsistency taking his calcium.  He is advised to resume calcium supplements with calcium carbonate 750 mg p.o. 3 times daily with meals along with his calcitriol 0.5 mcg p.o. every morning with breakfast.    He is extensively counseled for smoking cessation. - I advised patient to maintain close follow up with Andres Shad, MD for primary care needs.  Follow up plan: Return in about 6 months (around 02/14/2019) for Follow up with Pre-visit Labs, Thyroid / Neck Ultrasound.  Glade Lloyd, MD Phone: [phone]  Fax: [phone]  -  This note was partially dictated with voice recognition software. Similar sounding words can be transcribed inadequately or may not  be corrected upon review.  08/16/2018, 9:31 AM

## 2018-08-21 DIAGNOSIS — Z713 Dietary counseling and surveillance: Secondary | ICD-10-CM | POA: Diagnosis not present

## 2018-08-21 DIAGNOSIS — Z23 Encounter for immunization: Secondary | ICD-10-CM | POA: Diagnosis not present

## 2018-08-21 DIAGNOSIS — Z6830 Body mass index (BMI) 30.0-30.9, adult: Secondary | ICD-10-CM | POA: Diagnosis not present

## 2018-08-21 DIAGNOSIS — Z79899 Other long term (current) drug therapy: Secondary | ICD-10-CM | POA: Diagnosis not present

## 2018-08-21 DIAGNOSIS — I10 Essential (primary) hypertension: Secondary | ICD-10-CM | POA: Diagnosis not present

## 2018-08-21 DIAGNOSIS — F419 Anxiety disorder, unspecified: Secondary | ICD-10-CM | POA: Diagnosis not present

## 2018-08-21 DIAGNOSIS — G4700 Insomnia, unspecified: Secondary | ICD-10-CM | POA: Diagnosis not present

## 2018-08-28 DIAGNOSIS — Z08 Encounter for follow-up examination after completed treatment for malignant neoplasm: Secondary | ICD-10-CM | POA: Diagnosis not present

## 2018-08-28 DIAGNOSIS — Z85828 Personal history of other malignant neoplasm of skin: Secondary | ICD-10-CM | POA: Diagnosis not present

## 2018-08-28 DIAGNOSIS — L570 Actinic keratosis: Secondary | ICD-10-CM | POA: Diagnosis not present

## 2018-08-28 DIAGNOSIS — F419 Anxiety disorder, unspecified: Secondary | ICD-10-CM | POA: Diagnosis not present

## 2018-08-28 DIAGNOSIS — Z713 Dietary counseling and surveillance: Secondary | ICD-10-CM | POA: Diagnosis not present

## 2018-08-28 DIAGNOSIS — X32XXXD Exposure to sunlight, subsequent encounter: Secondary | ICD-10-CM | POA: Diagnosis not present

## 2018-08-28 DIAGNOSIS — G4700 Insomnia, unspecified: Secondary | ICD-10-CM | POA: Diagnosis not present

## 2018-08-28 DIAGNOSIS — I10 Essential (primary) hypertension: Secondary | ICD-10-CM | POA: Diagnosis not present

## 2018-08-28 DIAGNOSIS — C44319 Basal cell carcinoma of skin of other parts of face: Secondary | ICD-10-CM | POA: Diagnosis not present

## 2018-08-28 DIAGNOSIS — Z79899 Other long term (current) drug therapy: Secondary | ICD-10-CM | POA: Diagnosis not present

## 2018-08-28 DIAGNOSIS — Z6830 Body mass index (BMI) 30.0-30.9, adult: Secondary | ICD-10-CM | POA: Diagnosis not present

## 2018-08-28 DIAGNOSIS — Z23 Encounter for immunization: Secondary | ICD-10-CM | POA: Diagnosis not present

## 2018-10-16 DIAGNOSIS — Z08 Encounter for follow-up examination after completed treatment for malignant neoplasm: Secondary | ICD-10-CM | POA: Diagnosis not present

## 2018-10-16 DIAGNOSIS — Z85828 Personal history of other malignant neoplasm of skin: Secondary | ICD-10-CM | POA: Diagnosis not present

## 2018-10-16 DIAGNOSIS — X32XXXD Exposure to sunlight, subsequent encounter: Secondary | ICD-10-CM | POA: Diagnosis not present

## 2018-10-16 DIAGNOSIS — L570 Actinic keratosis: Secondary | ICD-10-CM | POA: Diagnosis not present

## 2018-10-26 ENCOUNTER — Other Ambulatory Visit: Payer: Self-pay | Admitting: Cardiovascular Disease

## 2018-11-18 ENCOUNTER — Other Ambulatory Visit: Payer: Self-pay | Admitting: Cardiovascular Disease

## 2018-11-20 ENCOUNTER — Other Ambulatory Visit: Payer: Self-pay | Admitting: *Deleted

## 2018-11-20 MED ORDER — LOSARTAN POTASSIUM 25 MG PO TABS: 50.0000 mg | ORAL_TABLET | Freq: Every day | ORAL | 0 refills | Status: DC

## 2018-11-21 DIAGNOSIS — F419 Anxiety disorder, unspecified: Secondary | ICD-10-CM | POA: Diagnosis not present

## 2018-11-21 DIAGNOSIS — Z634 Disappearance and death of family member: Secondary | ICD-10-CM | POA: Diagnosis not present

## 2018-11-21 DIAGNOSIS — Z6831 Body mass index (BMI) 31.0-31.9, adult: Secondary | ICD-10-CM | POA: Diagnosis not present

## 2018-11-21 DIAGNOSIS — I10 Essential (primary) hypertension: Secondary | ICD-10-CM | POA: Diagnosis not present

## 2019-01-04 ENCOUNTER — Other Ambulatory Visit: Payer: Self-pay | Admitting: "Endocrinology

## 2019-01-04 DIAGNOSIS — C73 Malignant neoplasm of thyroid gland: Secondary | ICD-10-CM

## 2019-01-10 ENCOUNTER — Other Ambulatory Visit: Payer: Self-pay | Admitting: Cardiovascular Disease

## 2019-02-08 DIAGNOSIS — X32XXXD Exposure to sunlight, subsequent encounter: Secondary | ICD-10-CM | POA: Diagnosis not present

## 2019-02-08 DIAGNOSIS — C44311 Basal cell carcinoma of skin of nose: Secondary | ICD-10-CM | POA: Diagnosis not present

## 2019-02-08 DIAGNOSIS — L570 Actinic keratosis: Secondary | ICD-10-CM | POA: Diagnosis not present

## 2019-02-08 DIAGNOSIS — C4441 Basal cell carcinoma of skin of scalp and neck: Secondary | ICD-10-CM | POA: Diagnosis not present

## 2019-02-14 ENCOUNTER — Ambulatory Visit: Payer: PPO | Admitting: "Endocrinology

## 2019-02-20 DIAGNOSIS — Z1389 Encounter for screening for other disorder: Secondary | ICD-10-CM | POA: Diagnosis not present

## 2019-02-20 DIAGNOSIS — F419 Anxiety disorder, unspecified: Secondary | ICD-10-CM | POA: Diagnosis not present

## 2019-02-20 DIAGNOSIS — F17200 Nicotine dependence, unspecified, uncomplicated: Secondary | ICD-10-CM | POA: Diagnosis not present

## 2019-02-20 DIAGNOSIS — F329 Major depressive disorder, single episode, unspecified: Secondary | ICD-10-CM | POA: Diagnosis not present

## 2019-02-21 ENCOUNTER — Telehealth: Payer: Self-pay

## 2019-02-21 DIAGNOSIS — E890 Postprocedural hypothyroidism: Secondary | ICD-10-CM

## 2019-02-21 DIAGNOSIS — C73 Malignant neoplasm of thyroid gland: Secondary | ICD-10-CM

## 2019-02-21 NOTE — Telephone Encounter (Signed)
Jose Burch, CMA  

## 2019-03-08 DIAGNOSIS — C44319 Basal cell carcinoma of skin of other parts of face: Secondary | ICD-10-CM | POA: Diagnosis not present

## 2019-03-21 ENCOUNTER — Other Ambulatory Visit: Payer: Self-pay

## 2019-03-21 ENCOUNTER — Other Ambulatory Visit: Payer: PPO

## 2019-03-21 DIAGNOSIS — Z20822 Contact with and (suspected) exposure to covid-19: Secondary | ICD-10-CM

## 2019-03-21 DIAGNOSIS — R6889 Other general symptoms and signs: Secondary | ICD-10-CM | POA: Diagnosis not present

## 2019-03-26 DIAGNOSIS — E890 Postprocedural hypothyroidism: Secondary | ICD-10-CM | POA: Diagnosis not present

## 2019-03-26 DIAGNOSIS — C73 Malignant neoplasm of thyroid gland: Secondary | ICD-10-CM | POA: Diagnosis not present

## 2019-03-27 LAB — COMPREHENSIVE METABOLIC PANEL
AG Ratio: 1.5 (calc) (ref 1.0–2.5)
ALT: 15 U/L (ref 9–46)
AST: 17 U/L (ref 10–35)
Albumin: 4.1 g/dL (ref 3.6–5.1)
Alkaline phosphatase (APISO): 66 U/L (ref 35–144)
BUN: 14 mg/dL (ref 7–25)
CO2: 25 mmol/L (ref 20–32)
Calcium: 8.9 mg/dL (ref 8.6–10.3)
Chloride: 100 mmol/L (ref 98–110)
Creat: 1.1 mg/dL (ref 0.70–1.25)
Globulin: 2.8 g/dL (calc) (ref 1.9–3.7)
Glucose, Bld: 206 mg/dL — ABNORMAL HIGH (ref 65–139)
Potassium: 3.7 mmol/L (ref 3.5–5.3)
Sodium: 138 mmol/L (ref 135–146)
Total Bilirubin: 0.5 mg/dL (ref 0.2–1.2)
Total Protein: 6.9 g/dL (ref 6.1–8.1)

## 2019-03-27 LAB — TSH: TSH: 1.02 mIU/L (ref 0.40–4.50)

## 2019-03-27 LAB — T4, FREE: Free T4: 1.6 ng/dL (ref 0.8–1.8)

## 2019-03-29 ENCOUNTER — Other Ambulatory Visit: Payer: Self-pay

## 2019-03-29 ENCOUNTER — Ambulatory Visit (HOSPITAL_COMMUNITY)
Admission: RE | Admit: 2019-03-29 | Discharge: 2019-03-29 | Disposition: A | Discharge status: Home / Self Care | Payer: PPO | Source: Ambulatory Visit | Attending: "Endocrinology | Admitting: "Endocrinology

## 2019-03-29 DIAGNOSIS — C73 Malignant neoplasm of thyroid gland: Secondary | ICD-10-CM | POA: Insufficient documentation

## 2019-03-29 DIAGNOSIS — E042 Nontoxic multinodular goiter: Secondary | ICD-10-CM | POA: Diagnosis not present

## 2019-03-29 LAB — NOVEL CORONAVIRUS, NAA: SARS-CoV-2, NAA: NOT DETECTED

## 2019-04-02 ENCOUNTER — Other Ambulatory Visit: Payer: Self-pay

## 2019-04-02 ENCOUNTER — Encounter: Payer: Self-pay | Admitting: "Endocrinology

## 2019-04-02 ENCOUNTER — Ambulatory Visit (INDEPENDENT_AMBULATORY_CARE_PROVIDER_SITE_OTHER): Payer: PPO | Admitting: "Endocrinology

## 2019-04-02 VITALS — BP 145/84 | HR 86 | Ht 70.0 in | Wt 225.6 lb

## 2019-04-02 DIAGNOSIS — E890 Postprocedural hypothyroidism: Secondary | ICD-10-CM

## 2019-04-02 MED ORDER — LEVOTHYROXINE SODIUM 175 MCG PO TABS: 175.0000 ug | ORAL_TABLET | Freq: Every day | ORAL | 3 refills | Status: DC

## 2019-04-02 NOTE — Progress Notes (Signed)
Endocrinology follow-up note   Subjective:    Patient ID: Jose Burch, male    DOB: November 16, 1950, PCP Andres Shad, MD   Past Medical History:  Diagnosis Date  . Cancer ([place])    Thyroid  . Hypertension   . Insomnia   . Thyroid disease   . Tobacco abuse    Past Surgical History:  Procedure Laterality Date  . THYROIDECTOMY  2015   Social History   Socioeconomic History  . Marital status: Married    Spouse name: Not on file  . Number of children: Not on file  . Years of education: Not on file  . Highest education level: Not on file  Occupational History  . Not on file  Social Needs  . Financial resource strain: Not on file  . Food insecurity    Worry: Not on file    Inability: Not on file  . Transportation needs    Medical: Not on file    Non-medical: Not on file  Tobacco Use  . Smoking status: Current Every Day Smoker    Packs/day: 0.25    Types: Cigarettes  . Smokeless tobacco: Never Used  . Tobacco comment: down to 2 ciggs per day 10/21/17  Substance and Sexual Activity  . Alcohol use: No  . Drug use: No  . Sexual activity: Never    Partners: Female  Lifestyle  . Physical activity    Days per week: Not on file    Minutes per session: Not on file  . Stress: Not on file  Relationships  . Social Herbalist on phone: Not on file    Gets together: Not on file    Attends religious service: Not on file    Active member of club or organization: Not on file    Attends meetings of clubs or organizations: Not on file    Relationship status: Not on file  Other Topics Concern  . Not on file  Social History Narrative  . Not on file   Outpatient Encounter Medications as of 04/02/2019  Medication Sig  . buPROPion (ZYBAN) 150 MG 12 hr tablet Take 150 mg by mouth 2 (two) times daily.  Marland Kitchen ADVAIR DISKUS 100-50 MCG/DOSE AEPB INHALE 1 PUFF 2 (TWO) TIMES DAILY INTO THE LUNGS  . amLODipine (NORVASC) 10 MG tablet Take 10 mg by mouth daily.  Marland Kitchen  aspirin EC 81 MG tablet Take 1 tablet (81 mg total) by mouth daily.  . calcitRIOL (ROCALTROL) 0.5 MCG capsule Take 1 capsule (0.5 mcg total) by mouth daily.  . calcium carbonate (TUMS EX) 750 MG chewable tablet Chew 1 tablet (750 mg total) by mouth 3 (three) times daily with meals.  Marland Kitchen levothyroxine (SYNTHROID) 175 MCG tablet Take 1 tablet (175 mcg total) by mouth daily before breakfast.  . LORazepam (ATIVAN) 2 MG tablet Take 1 tablet by mouth at bedtime.  Marland Kitchen losartan (COZAAR) 25 MG tablet TAKE 2 TABLETS BY MOUTH EVERY DAY  . traMADol (ULTRAM) 50 MG tablet Take 50 mg by mouth every 6 (six) hours as needed.  . [DISCONTINUED] levothyroxine (SYNTHROID, LEVOTHROID) 175 MCG tablet Take 1 tablet (175 mcg total) by mouth daily before breakfast.   No facility-administered encounter medications on file as of 04/02/2019.    ALLERGIES: No Known Allergies  VACCINATION STATUS:  There is no immunization history on file for this patient.  HPI 68 year old man with medical history as above. He is being seen in Follow-up for history of papillary  thyroid cancer, post thyroidectomy hypothyroidism.  - He is status post total thyroidectomy in 02/19/2014 followed by remnant ablation with I-131 On 03/22/2014, post therapy scan on 04/01/2014 showing 2 foci of indeterminate activity identified in the suspected neck region.  - He underwent Thyrogen stimulated whole-body scan which was negative for metastatic thyroid cancer- on February 04, 2017. -His previsit thyroid/neck ultrasound is negative for any residual or recurrent thyroid lesions. -  He is on levothyroxine 150 g by mouth every morning. He is compliant with this medication. -He denies any new complaints today.  No heat/cold intolerance.  He denies tremors, palpitations.  He is gaining weight. - His recent labs still  show mild hypocalcemia at 7.3.  He admits to inconsistency in taking his calcium, taking it only one time instead of 3 times recommended.  He  denies dysphagia, shortness of breath, voice change. -  He was supposed to be on calcium carbonate 750 mg p.o. 3 x  daily, and calcitriol 0.5 g daily since the time of his surgery. He reports that surgery was relatively difficult taking  6 hours to complete.   He denies family history of thyroid cancer nor any thyroid dysfunction. He is a chronic active smoker for the last 15 years. He has hypertension on treatment. -He complains of fatigue, intermittent wheezing.  Review of Systems  Constitutional:  + Gained 11 pounds since last visit,  + fatigue, no subjective hyperthermia, no subjective hypothermia Eyes: no blurry vision, no xerophthalmia ENT: no sore throat, no nodules palpated in throat, no dysphagia, no odynophagia, no voice change.   Cardiovascular: no Chest Pain, no Shortness of Breath, no palpitations, no leg swelling Respiratory: + cough, no SOB Gastrointestinal: no Nausea/Vomiting/Diarhhea Musculoskeletal: no muscle/joint aches Skin: no rashes Neurological: no tremors, no numbness, no tingling, no dizziness Psychiatric: no depression, no anxiety  Objective:    BP (!) 145/84   Pulse 86   Ht 5\' 10"  (1.778 m)   Wt 225 lb 9.6 oz (102.3 kg)   SpO2 96%   BMI 32.37 kg/m   Wt Readings from Last 3 Encounters:  04/02/19 225 lb 9.6 oz (102.3 kg)  08/16/18 229 lb (103.9 kg)  04/10/18 218 lb (98.9 kg)    Physical Exam  Constitutional: + Obese for height,  not in acute distress, normal state of mind Eyes: PERRLA, EOMI, no exophthalmos ENT: moist mucous membranes, + long horizontal lower neck surgical scar from prior total thyroidectomy/neck dissection , no cervical lymphadenopathy  Musculoskeletal: no gross deformities, strength intact in all four extremities Skin: moist, warm, no rashes Neurological: no tremor with outstretched hands, Deep tendon reflexes normal in all four extremities.   Recent Results (from the past 2160 hour(s))  Novel Coronavirus, NAA ([facility])      Status: None   Collection Time: 03/21/19  3:55 PM  Result Value Ref Range   SARS-CoV-2, NAA Not Detected Not Detected    Comment: This test was developed and its performance characteristics determined by [employer]. This test has not been FDA cleared or approved. This test has been authorized by FDA under an Emergency Use Authorization (EUA). This test is only authorized for the duration of time the declaration that circumstances exist justifying the authorization of the emergency use of in vitro diagnostic tests for detection of SARS-CoV-2 virus and/or diagnosis of COVID-19 infection under section 564(b)(1) of the Act, 21 U.S.C. 903ESP-2(Z)(3), unless the authorization is terminated or revoked sooner. When diagnostic testing is negative, the possibility of a false negative  result should be considered in the context of a patient's recent exposures and the presence of clinical signs and symptoms consistent with COVID-19. An individual without symptoms of COVID-19 and who is not shedding SARS-CoV-2 virus would expect to have a negative (not detected) result in this assay.   TSH     Status: None   Collection Time: 03/26/19 10:54 AM  Result Value Ref Range   TSH 1.02 0.40 - 4.50 mIU/L  T4, Free     Status: None   Collection Time: 03/26/19 10:54 AM  Result Value Ref Range   Free T4 1.6 0.8 - 1.8 ng/dL  Comprehensive metabolic panel     Status: Abnormal   Collection Time: 03/26/19 10:54 AM  Result Value Ref Range   Glucose, Bld 206 (H) 65 - 139 mg/dL    Comment: .        Non-fasting reference interval .    BUN 14 7 - 25 mg/dL   Creat 1.10 0.70 - 1.25 mg/dL    Comment: For patients >69 years of age, the reference limit for Creatinine is approximately 13% higher for people identified as African-American. .    BUN/Creatinine Ratio NOT APPLICABLE 6 - 22 (calc)   Sodium 138 135 - 146 mmol/L   Potassium 3.7 3.5 - 5.3 mmol/L   Chloride 100 98 - 110 mmol/L   CO2 25 20 - 32  mmol/L   Calcium 8.9 8.6 - 10.3 mg/dL   Total Protein 6.9 6.1 - 8.1 g/dL   Albumin 4.1 3.6 - 5.1 g/dL   Globulin 2.8 1.9 - 3.7 g/dL (calc)   AG Ratio 1.5 1.0 - 2.5 (calc)   Total Bilirubin 0.5 0.2 - 1.2 mg/dL   Alkaline phosphatase (APISO) 66 35 - 144 U/L   AST 17 10 - 35 U/L   ALT 15 9 - 46 U/L     Assessment & Plan:   1. Malignant neoplasm of thyroid gland ([place]) 2. Postsurgical hypothyroidism  -  He underwent total thyroidectomy on 02/19/2014 followed by thyroid remnant ablation with I-131 on 03/22/2014, was therapy whole-body scan on 04/01/2014 showed 2 foci of indeterminate activity identified in the neck region. No subsequent imaging studies for him. - Thyrogen stimulated whole-body scan - no evidence of metastatic thyroid cancer- done on 02/04/2017 - His previsit thyroid/neck ultrasound is negative for residual or recurrent thyroid lesions.    -  His repeat thyroid function test are consistent with appropriate replacement with levothyroxine.   -He is advised to continue levothyroxine 125 mcg p.o. every morning.     - We discussed about the correct intake of his thyroid hormone, on empty stomach at fasting, with water, separated by at least 30 minutes from breakfast and other medications,  and separated by more than 4 hours from calcium, iron, multivitamins, acid reflux medications (PPIs). -Patient is made aware of the fact that thyroid hormone replacement is needed for life, dose to be adjusted by periodic monitoring of thyroid function tests.    3. Hypocalcemia - Likely a result of partial surgical hypoparathyroidism.  He is serum calcium level is suboptimal at 7.3, due to his inconsistency taking his calcium.  He is advised to resume calcium supplements with calcium carbonate 750 mg p.o. 3 times daily with meals along with his calcitriol 0.5 mcg p.o. every morning with breakfast.    He is extensively counseled for smoking cessation. - I advised patient to maintain close  follow up with Andres Shad, MD for primary care  needs.   Time for this visit: 15 minutes. [name]  participated in the discussions, expressed understanding, and voiced agreement with the above plans.  All questions were answered to his satisfaction. he is encouraged to contact clinic should he have any questions or concerns prior to his return visit.  Follow up plan: Return in about 1 year (around 04/01/2020) for Follow up with Pre-visit Labs.  Glade Lloyd, MD Phone: [phone]  Fax: [phone]  -  This note was partially dictated with voice recognition software. Similar sounding words can be transcribed inadequately or may not  be corrected upon review.  04/02/2019, 1:08 PM

## 2019-04-03 DIAGNOSIS — Z6831 Body mass index (BMI) 31.0-31.9, adult: Secondary | ICD-10-CM | POA: Diagnosis not present

## 2019-04-03 DIAGNOSIS — I10 Essential (primary) hypertension: Secondary | ICD-10-CM | POA: Diagnosis not present

## 2019-04-03 DIAGNOSIS — J309 Allergic rhinitis, unspecified: Secondary | ICD-10-CM | POA: Diagnosis not present

## 2019-04-03 DIAGNOSIS — F329 Major depressive disorder, single episode, unspecified: Secondary | ICD-10-CM | POA: Diagnosis not present

## 2019-04-03 DIAGNOSIS — Z713 Dietary counseling and surveillance: Secondary | ICD-10-CM | POA: Diagnosis not present

## 2019-04-03 DIAGNOSIS — F419 Anxiety disorder, unspecified: Secondary | ICD-10-CM | POA: Diagnosis not present

## 2019-04-12 ENCOUNTER — Other Ambulatory Visit: Payer: Self-pay | Admitting: Cardiovascular Disease

## 2019-04-12 DIAGNOSIS — L821 Other seborrheic keratosis: Secondary | ICD-10-CM | POA: Diagnosis not present

## 2019-04-12 DIAGNOSIS — Z85828 Personal history of other malignant neoplasm of skin: Secondary | ICD-10-CM | POA: Diagnosis not present

## 2019-04-12 DIAGNOSIS — Z08 Encounter for follow-up examination after completed treatment for malignant neoplasm: Secondary | ICD-10-CM | POA: Diagnosis not present

## 2019-04-12 DIAGNOSIS — X32XXXD Exposure to sunlight, subsequent encounter: Secondary | ICD-10-CM | POA: Diagnosis not present

## 2019-04-12 DIAGNOSIS — L570 Actinic keratosis: Secondary | ICD-10-CM | POA: Diagnosis not present

## 2019-05-05 ENCOUNTER — Emergency Department (HOSPITAL_COMMUNITY)
Admission: EM | Admit: 2019-05-05 | Discharge: 2019-05-05 | Disposition: A | Discharge status: Home / Self Care | Payer: PPO | Attending: Emergency Medicine | Admitting: Emergency Medicine

## 2019-05-05 ENCOUNTER — Emergency Department (HOSPITAL_COMMUNITY): Payer: PPO

## 2019-05-05 ENCOUNTER — Encounter (HOSPITAL_COMMUNITY): Payer: Self-pay | Admitting: Emergency Medicine

## 2019-05-05 ENCOUNTER — Other Ambulatory Visit: Payer: Self-pay

## 2019-05-05 DIAGNOSIS — I10 Essential (primary) hypertension: Secondary | ICD-10-CM | POA: Diagnosis not present

## 2019-05-05 DIAGNOSIS — Z79899 Other long term (current) drug therapy: Secondary | ICD-10-CM | POA: Diagnosis not present

## 2019-05-05 DIAGNOSIS — Z7982 Long term (current) use of aspirin: Secondary | ICD-10-CM | POA: Diagnosis not present

## 2019-05-05 DIAGNOSIS — F17210 Nicotine dependence, cigarettes, uncomplicated: Secondary | ICD-10-CM | POA: Insufficient documentation

## 2019-05-05 DIAGNOSIS — F32A Depression, unspecified: Secondary | ICD-10-CM

## 2019-05-05 DIAGNOSIS — E039 Hypothyroidism, unspecified: Secondary | ICD-10-CM | POA: Insufficient documentation

## 2019-05-05 DIAGNOSIS — R530 Neoplastic (malignant) related fatigue: Secondary | ICD-10-CM | POA: Diagnosis not present

## 2019-05-05 DIAGNOSIS — F329 Major depressive disorder, single episode, unspecified: Secondary | ICD-10-CM | POA: Diagnosis not present

## 2019-05-05 DIAGNOSIS — G4700 Insomnia, unspecified: Secondary | ICD-10-CM | POA: Diagnosis not present

## 2019-05-05 DIAGNOSIS — R05 Cough: Secondary | ICD-10-CM | POA: Diagnosis not present

## 2019-05-05 DIAGNOSIS — Z85850 Personal history of malignant neoplasm of thyroid: Secondary | ICD-10-CM | POA: Insufficient documentation

## 2019-05-05 DIAGNOSIS — R059 Cough, unspecified: Secondary | ICD-10-CM

## 2019-05-05 DIAGNOSIS — R531 Weakness: Secondary | ICD-10-CM | POA: Insufficient documentation

## 2019-05-05 LAB — COMPREHENSIVE METABOLIC PANEL
ALT: 19 U/L (ref 0–44)
AST: 17 U/L (ref 15–41)
Albumin: 3.9 g/dL (ref 3.5–5.0)
Alkaline Phosphatase: 67 U/L (ref 38–126)
Anion gap: 11 (ref 5–15)
BUN: 15 mg/dL (ref 8–23)
CO2: 23 mmol/L (ref 22–32)
Calcium: 7.4 mg/dL — ABNORMAL LOW (ref 8.9–10.3)
Chloride: 104 mmol/L (ref 98–111)
Creatinine, Ser: 0.95 mg/dL (ref 0.61–1.24)
GFR calc Af Amer: >60 mL/min (ref >60)
GFR calc non Af Amer: >60 mL/min (ref >60)
Glucose, Bld: 105 mg/dL — ABNORMAL HIGH (ref 70–99)
Potassium: 3.7 mmol/L (ref 3.5–5.1)
Sodium: 138 mmol/L (ref 135–145)
Total Bilirubin: 0.4 mg/dL (ref 0.3–1.2)
Total Protein: 7.6 g/dL (ref 6.5–8.1)

## 2019-05-05 LAB — CBC WITH DIFFERENTIAL/PLATELET
Abs Immature Granulocytes: 0.06 10*3/uL (ref 0.00–0.07)
Basophils Absolute: 0.1 10*3/uL (ref 0.0–0.1)
Basophils Relative: 1 %
Eosinophils Absolute: 0.2 10*3/uL (ref 0.0–0.5)
Eosinophils Relative: 1 %
HCT: 44.1 % (ref 39.0–52.0)
Hemoglobin: 14.4 g/dL (ref 13.0–17.0)
Immature Granulocytes: 0 %
Lymphocytes Relative: 22 %
Lymphs Abs: 3.2 10*3/uL (ref 0.7–4.0)
MCH: 28 pg (ref 26.0–34.0)
MCHC: 32.7 g/dL (ref 30.0–36.0)
MCV: 85.6 fL (ref 80.0–100.0)
Monocytes Absolute: 1.2 10*3/uL — ABNORMAL HIGH (ref 0.1–1.0)
Monocytes Relative: 8 %
Neutro Abs: 10.1 10*3/uL — ABNORMAL HIGH (ref 1.7–7.7)
Neutrophils Relative %: 68 %
Platelets: 326 10*3/uL (ref 150–400)
RBC: 5.15 MIL/uL (ref 4.22–5.81)
RDW: 14.4 % (ref 11.5–15.5)
WBC: 14.7 10*3/uL — ABNORMAL HIGH (ref 4.0–10.5)
nRBC: 0 % (ref 0.0–0.2)

## 2019-05-05 LAB — URINALYSIS, ROUTINE W REFLEX MICROSCOPIC
Bilirubin Urine: NEGATIVE
Glucose, UA: NEGATIVE mg/dL
Hgb urine dipstick: NEGATIVE
Ketones, ur: NEGATIVE mg/dL
Leukocytes,Ua: NEGATIVE
Nitrite: NEGATIVE
Protein, ur: NEGATIVE mg/dL
Specific Gravity, Urine: 1.006 (ref 1.005–1.030)
pH: 7 (ref 5.0–8.0)

## 2019-05-05 LAB — TROPONIN I (HIGH SENSITIVITY): Troponin I (High Sensitivity): 3 ng/L (ref <18)

## 2019-05-05 LAB — TSH: TSH: 0.158 u[IU]/mL — ABNORMAL LOW (ref 0.350–4.500)

## 2019-05-05 MED ORDER — ZOLPIDEM TARTRATE 5 MG PO TABS: 5.0000 mg | ORAL_TABLET | Freq: Every evening | ORAL | 0 refills | Status: DC | PRN

## 2019-05-05 NOTE — Discharge Instructions (Signed)
Please follow-up with the psychiatric service for further care, you may contact outpatient services at [facility] locally or a number of other psychiatric facilities.  I have included a resource list.  Emergency department for severe or worsening symptoms.Substance Abuse Treatment Programs  Intensive Outpatient Programs [facility]     [address], [place]       [facility] [place] [address] [place], [place]  [facility]     (Inpatient and outpatient)     [address]           [place] [phone] (Suboxone and Methadone)  [place], [place] [address]      [facility] [address] [place], [place]  [facility] (Outpatient/Inpatient, [place])    (insurance only) [phone]             [facility] ([place]) [place], [place]     [facility]     [address]     [place], [place]       [facility] (for caregivers and family) [phone] [address] Kristeen Mans. [place], [place]      Residential Treatment Programs [facility]      [address], [place], [place] [address]  [phone]       [facility] [address] [place], [place] [address] [phone]  [facility] [place]        [phone]       [facility] [phone]  [facility] ([place]             [place], [place] or [place] of [place], [address] [phone]  [facility]    [address]      [place], [place]       [facility] [address] [place], [place]  [place]   [address] [place], [place] [address]     [phone]      Admissions: 8am-3pm M-F  [facility] ([facility]) [address] [place], [place]  [facility] Program: Residential Program [phone] Days)   [place], [place] or [phone]     ADATC: [facility] [place], [place] (Walk in Hours over the weekend or by referral)  [facility] [address]  [place], [place], [place] [address] [phone]  Crisis Mobile: [facility]:  [phone] (for crisis response 24 hours a day) [facility] Hotline:      [phone] Outpatient Psychiatry and Counseling  [facility]: Mobile Crisis Management 24 hours:  [phone]  [facility] of the [employer] sliding scale fee and walk in schedule: M-F 8am-12pm/1pm-3pm [place], [place] [address] [place], [place] [address] [phone]  [facility] ([facility])- new patient walk-in appointments available Monday - Friday 8am -3pm.          [address] [place], [place] [address] [phone] or crisis line- [place] [facility]/ Intensive Outpatient Therapy Program [place], [place] [address] [place]      [phone] [address] [place], [place] [address]                 [place]   [facility] [phone]. [place], [place] [address]   [facility]          [address] Johnette Abraham  [place], [place] [address]       [phone]  [facility] [address] [place], [place] [address] [phone]  [facility]    [address] [place], [place] [address]     [place], [place] Joycelyn Man     [place] [address]     [phone]       [facility] [place] [address]  [facility]     [address], [place], [place], [place] [address] [place]     [address]     [place], [place] [address]     [phone]       [facility] [address] [place], [place] [address] [phone] Resources for Temporary Residential Assistance/Crisis [facility] [place]) M-F 8am-3pm   [address] [place], [place] [address]   [phone] Services include: laundry, barbering, support groups, case management, phone  & computer access, showers, AA/NA mtgs, mental health/substance abuse nurse, job skills class, disability information, VA assistance, spiritual classes, etc.   [facility]   [address], [place]     [place]              [employer] (women and children)       [place]. [place], [place] [address] [phone] [email or web address] for application and process Application Required  [facility]   [address]    [place] [address]     [phone]                    [place] [address]  [place], [place] [address] [phone](HYIFOYDX application appt.) Application Required  [facility] (women only)    [place], [place] [address]     [phone]      Intake starts 6pm daily Need valid ID, SSC, & Police report [employer] [address] [place], [place] [phone] Application  Required  [employer] (men only)     [place].      [place], [place]       [facility] (Pregnant women only) [address] [place], [place]  [facility]      [place] [address].      [place], [place] [address]     [phone]             [facility] [address] [place], [place] 90 day commitment/SA/Application process  [facility](men only)     [address]     [place], [place]       Check-in at [facility] [address] [place], [place] [address] [phone] Men/Women/Women and Children must be there by 7 pm  [place], [place]

## 2019-05-05 NOTE — ED Triage Notes (Addendum)
Patient has multiple complaints. c/o productive cough x1 month with thick clear sputum. Denies any fevers. Per patient body aches without fever, dizziness with cough, left sided rib cage pain, and tightness in chest. Per patient great toe nail on left foot has "fallen off" and right great toenail "looks bad." Denies any injury or hx of diabetes. Patient reports "burnin"g sensation in feet. Patient tested for Covid in March (negative). Patient also reports anxiety and feeling overwhelmed. Increased smoking and unable to sleep x4-5 days. Patient reports thoughts of Si but reports he would not because of wife and his animals. Patient states "I think I need to talk to a psychiatrist.

## 2019-05-05 NOTE — ED Provider Notes (Signed)
[facility] Provider Note   CSN: [id number] Arrival date & time: 05/05/19  1112    History   Chief Complaint Chief Complaint  Patient presents with  . Cough  . V70.1    HPI Jose Burch is a 68 y.o. male.     Patient presents with multiple complaints including cough for 1 month, lethargy, achiness, intermittent chest tightness, concerned about his toenails, burning in his feet, poor sleep, anxiety, depression.  Status post thyroid cancer in the past.  No fever, sweats, chills, hemoptysis, dysuria, weight loss, neuro deficits.  COVID test in March negative.  Severity is mild to moderate.     Past Medical History:  Diagnosis Date  . Cancer ([place])    Thyroid  . Hypertension   . Insomnia   . Thyroid disease   . Tobacco abuse     Patient Active Problem List   Diagnosis Date Noted  . Hypocalcemia 08/15/2017  . Malignant neoplasm of thyroid gland ([place]) 01/13/2017  . Postsurgical hypothyroidism 01/13/2017    Past Surgical History:  Procedure Laterality Date  . skin cancer removed    . THYROIDECTOMY  2015        Home Medications    Prior to Admission medications   Medication Sig Start Date End Date Taking? Authorizing Provider  ADVAIR DISKUS 100-50 MCG/DOSE AEPB INHALE 1 PUFF 2 (TWO) TIMES DAILY INTO THE LUNGS 05/12/18   Herminio Commons, MD  amLODipine (NORVASC) 10 MG tablet Take 10 mg by mouth daily.    [provider]  aspirin EC 81 MG tablet Take 1 tablet (81 mg total) by mouth daily. 06/24/16   Herminio Commons, MD  buPROPion (ZYBAN) 150 MG 12 hr tablet Take 150 mg by mouth 2 (two) times daily.    [provider]  calcitRIOL (ROCALTROL) 0.5 MCG capsule Take 1 capsule (0.5 mcg total) by mouth daily. 08/16/18   Cassandria Anger, MD  calcium carbonate (TUMS EX) 750 MG chewable tablet Chew 1 tablet (750 mg total) by mouth 3 (three) times daily with meals. 08/16/18   Cassandria Anger, MD  levothyroxine  (SYNTHROID) 175 MCG tablet Take 1 tablet (175 mcg total) by mouth daily before breakfast. 04/02/19   Nida, Marella Chimes, MD  LORazepam (ATIVAN) 2 MG tablet Take 1 tablet by mouth at bedtime. 03/13/18   [provider]  losartan (COZAAR) 25 MG tablet TAKE 2 TABLETS BY MOUTH EVERY DAY 04/12/19   Herminio Commons, MD  traMADol (ULTRAM) 50 MG tablet Take 50 mg by mouth every 6 (six) hours as needed.    [provider]    Family History Family History  Problem Relation Age of Onset  . Cancer Mother   . Heart attack Sister     Social History Social History   Tobacco Use  . Smoking status: Current Every Day Smoker    Packs/day: 0.50    Types: Cigarettes  . Smokeless tobacco: Never Used  . Tobacco comment: down to 2 ciggs per day 10/21/17  Substance Use Topics  . Alcohol use: No  . Drug use: No     Allergies   Patient has no known allergies.   Review of Systems Review of Systems  All other systems reviewed and are negative.    Physical Exam Updated Vital Signs BP (!) 157/134   Pulse 90   Resp 17   Ht 5' 10.5" (1.791 m)   Wt 102.1 kg   SpO2 96%  BMI 31.83 kg/m   Physical Exam Vitals signs and nursing note reviewed.  Constitutional:      Appearance: He is well-developed.  HENT:     Head: Normocephalic and atraumatic.  Eyes:     Conjunctiva/sclera: Conjunctivae normal.  Neck:     Musculoskeletal: Neck supple.  Cardiovascular:     Rate and Rhythm: Normal rate and regular rhythm.  Pulmonary:     Effort: Pulmonary effort is normal.     Breath sounds: Normal breath sounds.  Abdominal:     General: Bowel sounds are normal.     Palpations: Abdomen is soft.  Musculoskeletal: Normal range of motion.  Skin:    General: Skin is warm and dry.     Comments: Onychomycosis bilateral feet, worse in great toes  Neurological:     Mental Status: He is alert and oriented to person, place, and time.  Psychiatric:     Comments: Flat affect      ED  Treatments / Results  Labs (all labs ordered are listed, but only abnormal results are displayed) Labs Reviewed  CBC WITH DIFFERENTIAL/PLATELET  COMPREHENSIVE METABOLIC PANEL  URINALYSIS, ROUTINE W REFLEX MICROSCOPIC  TSH  TROPONIN I (HIGH SENSITIVITY)    EKG EKG Interpretation  Date/Time:  Saturday May 05 2019 11:43:04 EDT Ventricular Rate:  75 PR Interval:    QRS Duration: 99 QT Interval:  407 QTC Calculation: 455 R Axis:   -88 Text Interpretation:  Sinus rhythm Left anterior fascicular block RSR' in V1 or V2, right VCD or RVH Baseline wander in lead(s) V5 V6 Confirmed by Nat Christen [phone]) on 05/05/2019 12:34:20 PM   Radiology No results found.  Procedures Procedures (including critical care time)  Medications Ordered in ED Medications - No data to display   Initial Impression / Assessment and Plan / ED Course  I have reviewed the triage vital signs and the nursing notes.  Pertinent labs & imaging results that were available during my care of the patient were reviewed by me and considered in my medical decision making (see chart for details).       Patient presents with a constellation of symptoms.  Nweke is primarily depressed.  Will obtain screening labs including TSH and chest x-ray.  Behavioral health consult.   Final Clinical Impressions(s) / ED Diagnoses   Final diagnoses:  Cough  Weakness  Insomnia, unspecified type  Depression, unspecified depression type    ED Discharge Orders    None       Nat Christen, MD 05/05/19 1240

## 2019-05-05 NOTE — ED Provider Notes (Signed)
I discussed care with the behavioral health team and have reviewed their notes.  The patient is cleared for discharge, he is not imminently dangerous to himself or others.  I have offered the patient 7 tablets of a sleeping pill to help him sleep at night.  He will need to follow-up in the outpatient setting.  He is agreeable.  He would like to pursue outpatient treatment which I think is reasonable.  At the bedside the patient does not appear suicidal and is very reasonable.   Noemi Chapel, MD 05/05/19 [phone]
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
## 2019-05-05 NOTE — ED Notes (Signed)
TTS in progress 

## 2019-05-08 ENCOUNTER — Encounter (HOSPITAL_COMMUNITY): Payer: Self-pay | Admitting: *Deleted

## 2019-05-08 ENCOUNTER — Emergency Department (HOSPITAL_COMMUNITY)
Admission: EM | Admit: 2019-05-08 | Discharge: 2019-05-08 | Disposition: A | Discharge status: Home / Self Care | Payer: PPO | Attending: Emergency Medicine | Admitting: Emergency Medicine

## 2019-05-08 ENCOUNTER — Emergency Department (HOSPITAL_COMMUNITY): Payer: PPO

## 2019-05-08 ENCOUNTER — Telehealth (HOSPITAL_COMMUNITY): Payer: Self-pay

## 2019-05-08 ENCOUNTER — Other Ambulatory Visit: Payer: Self-pay

## 2019-05-08 DIAGNOSIS — R0602 Shortness of breath: Secondary | ICD-10-CM | POA: Diagnosis not present

## 2019-05-08 DIAGNOSIS — R531 Weakness: Secondary | ICD-10-CM | POA: Diagnosis not present

## 2019-05-08 DIAGNOSIS — G4700 Insomnia, unspecified: Secondary | ICD-10-CM

## 2019-05-08 DIAGNOSIS — F419 Anxiety disorder, unspecified: Secondary | ICD-10-CM | POA: Insufficient documentation

## 2019-05-08 DIAGNOSIS — Z85850 Personal history of malignant neoplasm of thyroid: Secondary | ICD-10-CM | POA: Insufficient documentation

## 2019-05-08 DIAGNOSIS — I10 Essential (primary) hypertension: Secondary | ICD-10-CM | POA: Diagnosis not present

## 2019-05-08 DIAGNOSIS — Z79899 Other long term (current) drug therapy: Secondary | ICD-10-CM | POA: Diagnosis not present

## 2019-05-08 DIAGNOSIS — F17210 Nicotine dependence, cigarettes, uncomplicated: Secondary | ICD-10-CM | POA: Diagnosis not present

## 2019-05-08 DIAGNOSIS — F32A Depression, unspecified: Secondary | ICD-10-CM

## 2019-05-08 DIAGNOSIS — Z7982 Long term (current) use of aspirin: Secondary | ICD-10-CM | POA: Diagnosis not present

## 2019-05-08 DIAGNOSIS — J449 Chronic obstructive pulmonary disease, unspecified: Secondary | ICD-10-CM | POA: Insufficient documentation

## 2019-05-08 DIAGNOSIS — F329 Major depressive disorder, single episode, unspecified: Secondary | ICD-10-CM

## 2019-05-08 HISTORY — DX: Chronic obstructive pulmonary disease, unspecified: J44.9

## 2019-05-08 LAB — URINALYSIS, ROUTINE W REFLEX MICROSCOPIC
Bilirubin Urine: NEGATIVE
Glucose, UA: NEGATIVE mg/dL
Hgb urine dipstick: NEGATIVE
Ketones, ur: NEGATIVE mg/dL
Leukocytes,Ua: NEGATIVE
Nitrite: NEGATIVE
Protein, ur: NEGATIVE mg/dL
Specific Gravity, Urine: 1.01 (ref 1.005–1.030)
pH: 7 (ref 5.0–8.0)

## 2019-05-08 LAB — COMPREHENSIVE METABOLIC PANEL
ALT: 18 U/L (ref 0–44)
AST: 20 U/L (ref 15–41)
Albumin: 3.6 g/dL (ref 3.5–5.0)
Alkaline Phosphatase: 70 U/L (ref 38–126)
Anion gap: 11 (ref 5–15)
BUN: 11 mg/dL (ref 8–23)
CO2: 24 mmol/L (ref 22–32)
Calcium: 7.3 mg/dL — ABNORMAL LOW (ref 8.9–10.3)
Chloride: 103 mmol/L (ref 98–111)
Creatinine, Ser: 0.98 mg/dL (ref 0.61–1.24)
GFR calc Af Amer: >60 mL/min (ref >60)
GFR calc non Af Amer: >60 mL/min (ref >60)
Glucose, Bld: 230 mg/dL — ABNORMAL HIGH (ref 70–99)
Potassium: 3.6 mmol/L (ref 3.5–5.1)
Sodium: 138 mmol/L (ref 135–145)
Total Bilirubin: 0.5 mg/dL (ref 0.3–1.2)
Total Protein: 7.2 g/dL (ref 6.5–8.1)

## 2019-05-08 LAB — RAPID URINE DRUG SCREEN, HOSP PERFORMED
Amphetamines: NOT DETECTED
Barbiturates: NOT DETECTED
Benzodiazepines: POSITIVE — AB
Cocaine: NOT DETECTED
Opiates: NOT DETECTED
Tetrahydrocannabinol: NOT DETECTED

## 2019-05-08 LAB — CBC WITH DIFFERENTIAL/PLATELET
Abs Immature Granulocytes: 0.04 10*3/uL (ref 0.00–0.07)
Basophils Absolute: 0.1 10*3/uL (ref 0.0–0.1)
Basophils Relative: 1 %
Eosinophils Absolute: 0.1 10*3/uL (ref 0.0–0.5)
Eosinophils Relative: 1 %
HCT: 42 % (ref 39.0–52.0)
Hemoglobin: 13.6 g/dL (ref 13.0–17.0)
Immature Granulocytes: 0 %
Lymphocytes Relative: 17 %
Lymphs Abs: 2.1 10*3/uL (ref 0.7–4.0)
MCH: 27.8 pg (ref 26.0–34.0)
MCHC: 32.4 g/dL (ref 30.0–36.0)
MCV: 85.9 fL (ref 80.0–100.0)
Monocytes Absolute: 0.7 10*3/uL (ref 0.1–1.0)
Monocytes Relative: 5 %
Neutro Abs: 9.8 10*3/uL — ABNORMAL HIGH (ref 1.7–7.7)
Neutrophils Relative %: 76 %
Platelets: 307 10*3/uL (ref 150–400)
RBC: 4.89 MIL/uL (ref 4.22–5.81)
RDW: 14.5 % (ref 11.5–15.5)
WBC: 12.8 10*3/uL — ABNORMAL HIGH (ref 4.0–10.5)
nRBC: 0 % (ref 0.0–0.2)

## 2019-05-08 LAB — ETHANOL: Alcohol, Ethyl (B): <10 mg/dL (ref <10)

## 2019-05-08 MED ORDER — HYDROXYZINE HCL 25 MG PO TABS: 25.0000 mg | ORAL_TABLET | Freq: Four times a day (QID) | ORAL | 0 refills | Status: DC

## 2019-05-08 NOTE — Discharge Instructions (Addendum)
You have been seen today for shortness of breath, anxiety, and depression. Please read and follow all provided instructions.   1. Medications: vistaril (for anxiety and sleep), you could try over the counter melatonin for sleep, usual home medications 2. Treatment: rest, drink plenty of fluids 3. Follow Up: Please call and follow up with behavioral health. Please follow up with your primary doctor in 2-5 days for discussion of your diagnoses and further evaluation after today's visit; if you do not have a primary care doctor use the resource guide provided to find one; Please return to the ER for any new or worsening symptoms. Please obtain all of your results from medical records or have your doctors office obtain the results - share them with your doctor - you should be seen at your doctors office. Call today to arrange your follow up.   Take medications as prescribed. Please review all of the medicines and only take them if you do not have an allergy to them. Return to the emergency room for worsening condition or new concerning symptoms. Follow up with your regular doctor. If you don't have a regular doctor use one of the numbers below to establish a primary care doctor.  Please be aware that if you are taking birth control pills, taking other prescriptions, ESPECIALLY ANTIBIOTICS may make the birth control ineffective - if this is the case, either do not engage in sexual activity or use alternative methods of birth control such as condoms until you have finished the medicine and your family doctor says it is OK to restart them. If you are on a blood thinner such as COUMADIN, be aware that any other medicine that you take may cause the coumadin to either work too much, or not enough - you should have your coumadin level rechecked in next 7 days if this is the case.  ?  It is also a possibility that you have an allergic reaction to any of the medicines that you have been prescribed - Everybody reacts  differently to medications and while MOST people have no trouble with most medicines, you may have a reaction such as nausea, vomiting, rash, swelling, shortness of breath. If this is the case, please stop taking the medicine immediately and contact your physician.  ?  You should return to the ER if you develop severe or worsening symptoms.   Emergency Department Resource Guide 1) Find a Doctor and Pay Out of Pocket Although you won't have to find out who is covered by your insurance plan, it is a good idea to ask around and get recommendations. You will then need to call the office and see if the doctor you have chosen will accept you as a new patient and what types of options they offer for patients who are self-pay. Some doctors offer discounts or will set up payment plans for their patients who do not have insurance, but you will need to ask so you aren't surprised when you get to your appointment.  2) Contact Your Local Health Department Not all health departments have doctors that can see patients for sick visits, but many do, so it is worth a call to see if yours does. If you don't know where your local health department is, you can check in your phone book. The CDC also has a tool to help you locate your state's health department, and many state websites also have listings of all of their local health departments.  3) Find a [facility] If  your illness is not likely to be very severe or complicated, you may want to try a walk in clinic. These are popping up all over the country in pharmacies, drugstores, and shopping centers. They're usually staffed by nurse practitioners or physician assistants that have been trained to treat common illnesses and complaints. They're usually fairly quick and inexpensive. However, if you have serious medical issues or chronic medical problems, these are probably not your best option.  No Primary Care Doctor: Call Health Connect at  [phone] - they can help  you locate a primary care doctor that  accepts your insurance, provides certain services, etc. Physician Referral Service- [phone]  Chronic Pain Problems: Organization         Address  Phone   Notes  [facility]  [phone] Patients need to be referred by their primary care doctor.   Medication Assistance: Organization         Address  Phone   Notes  [facility] Medication [facility] [address] [place], [place] [address] [phone] --Must be a resident of [facility] -- Must have NO insurance coverage whatsoever (no Medicaid/ Medicare, etc.) -- The pt. MUST have a primary care doctor that directs their care regularly and follows them in the community   [facility]  [phone]   [employer]  [phone]    Agencies that provide inexpensive medical care: Organization         Address  Phone   Notes  [place]  [phone]   [facility]    [phone]   [facility] [place], [place] [address] [phone]   [place] [address] [place] [phone]   [facility]    [phone]   [facility]    [phone]   [place] and [place] [address], [place] Phone:  [phone], Fax:  [phone] Hours of Operation:  9 am - 6 pm, M-F.  Also accepts Medicaid/Medicare and self-pay.  [facility] for [place] [address], [address], [place] Phone: [phone], Fax: [phone]. Hours of Operation:  8:30 am - 5:30 pm, M-F.  Also accepts Medicaid and self-pay.  [facility] [address], [place] Phone: [phone]   [place], [place], [place] [phone], Ext. [phone] Mondays & Thursdays: 7-9 AM.  First 15 patients are seen on a first come, first serve basis.    [place]  Providers:  Organization         Address  Phone   Notes  [facility] [address], [place] [phone] Also accepts self-pay patients.  [facility] [address], [place]  [phone]   [place], [address], [place] [phone]   [facility] [address], [place] [phone]   Lucianne Lei [address], [place]   [phone] Only accepts [place] [facility] [place] patients after they have their name applied to their card.   Self-Pay (no insurance) in [facility]:  Organization         Address  Phone   Notes  Sickle Cell Patients, [facility] [place], [place] [phone]  [facility] [place] [phone]   [facility] [place]  [address], [place] [phone]   [facility]/Dr. Osei-Bonsu  [address], [place] or [address], [place] [phone] Phone number for both [place] and [place] locations is the same.  [facility] and [facility] [address] [place] [phone]   [facility] [address] [place] or [address] [phone]   [facility] [address] [place] [phone], phone; [phone], fax Sees patients 1st and 3rd Saturday of every month.  Must not qualify for public or private insurance (i.e. Medicaid, Medicare, [place] Health Choice, Veterans' Benefits)  Household income should be no more than 200% of the poverty level The clinic cannot treat you if you are pregnant or think you are pregnant  Sexually transmitted diseases are not treated at the clinic.

## 2019-05-08 NOTE — ED Triage Notes (Signed)
Pt c/o not being able to sleep well for the past 6 days. Pt reports he only gets about 1 hour of sleep. Pt reports he was diagnosed with depression when he was here on 7/25 and was to follow up with a provider as an outpt but has been unable to make the appt yet. Pt reports he feels like he has "too much to think about" which causes him to not be able to rest. Pt denise SI/HI at this time. Pt states, "no I don't have those thoughts because I have to help my wife". Pt appears anxious at time at triage, slightly restless, heavy breathing.

## 2019-05-08 NOTE — ED Provider Notes (Signed)
[facility] EMERGENCY DEPARTMENT Provider Note   CSN: [id number] Arrival date & time: 05/08/19  4098    History   Chief Complaint Chief Complaint  Patient presents with  . Insomnia    HPI Jose Burch is a 68 y.o. male with a PMH of Thyroid Cancer in 2015, Insomnia, Depression, and COPD presenting with insomnia for 6 days. Patient also reports intermittent shortness of breath for 5 days. Patient reports chronic intermittent chest soreness and states it is unchanged from baseline. Patient reports an intermittent dry cough for 1 month. Patient states he has been using inhaler with partial relief. Patient states nothing makes symptoms worse. Patient denies fever, chills, nausea, vomiting, or abdominal pain. Patient reports he was evaluated in the ER on 07/25 for similar symptoms and evaluated by behavioral health. Behavioral health recommended inpatient treatment, but patient declined and preferred to try outpatient treatment first. Patient reports he has not been able to schedule appointment yet. Patient reports worsening depression and anxiety. Patient states depression and anxiety contribute to insomnia. Patient denies SI, HI, or hallucinations. Patient states he has tried Ambien, Prozac, and Bupropion without relief. Patient states Ativan has helped with anxiety, but states he ran out of medication 2 days ago. Patient states he has only been able to sleep for 1 hour a day. Patient states history of thyroid cancer and home chores exacerbated his depression. Patient denies alcohol or drug use. Patient reports tobacco use. Patient denies sick exposures or recent travel.      HPI  Past Medical History:  Diagnosis Date  . Cancer ([place])    Thyroid  . COPD (chronic obstructive pulmonary disease) ([place])   . Hypertension   . Insomnia   . Thyroid disease   . Tobacco abuse     Patient Active Problem List   Diagnosis Date Noted  . Hypocalcemia 08/15/2017  . Malignant neoplasm of thyroid  gland ([place]) 01/13/2017  . Postsurgical hypothyroidism 01/13/2017    Past Surgical History:  Procedure Laterality Date  . skin cancer removed    . THYROIDECTOMY  2015        Home Medications    Prior to Admission medications   Medication Sig Start Date End Date Taking? Authorizing Provider  ADVAIR DISKUS 100-50 MCG/DOSE AEPB INHALE 1 PUFF 2 (TWO) TIMES DAILY INTO THE LUNGS 05/12/18  Yes Herminio Commons, MD  amLODipine (NORVASC) 10 MG tablet Take 10 mg by mouth daily.   Yes [provider]  buPROPion (WELLBUTRIN XL) 150 MG 24 hr tablet Take 150 mg by mouth daily.   Yes [provider]  calcitRIOL (ROCALTROL) 0.5 MCG capsule Take 1 capsule (0.5 mcg total) by mouth daily. 08/16/18  Yes Nida, Marella Chimes, MD  calcium carbonate (TUMS EX) 750 MG chewable tablet Chew 1 tablet (750 mg total) by mouth 3 (three) times daily with meals. 08/16/18  Yes Cassandria Anger, MD  levothyroxine (SYNTHROID) 175 MCG tablet Take 1 tablet (175 mcg total) by mouth daily before breakfast. 04/02/19  Yes Nida, Marella Chimes, MD  LORazepam (ATIVAN) 2 MG tablet Take 1 tablet by mouth at bedtime. 03/13/18  Yes [provider]  losartan (COZAAR) 25 MG tablet TAKE 2 TABLETS BY MOUTH EVERY DAY Patient taking differently: 25 mg.  04/12/19  Yes Herminio Commons, MD  traMADol (ULTRAM) 50 MG tablet Take 50 mg by mouth every 6 (six) hours as needed.   Yes [provider]  aspirin EC 81 MG tablet Take 1 tablet (81  mg total) by mouth daily. Patient not taking: Reported on 05/08/2019 06/24/16   Herminio Commons, MD  zolpidem (AMBIEN) 5 MG tablet Take 1 tablet (5 mg total) by mouth at bedtime as needed for up to 7 days for sleep. Patient not taking: Reported on 05/08/2019 05/05/19 05/12/19  Noemi Chapel, MD    Family History Family History  Problem Relation Age of Onset  . Cancer Mother   . Heart attack Sister     Social History Social History   Tobacco Use  . Smoking  status: Current Every Day Smoker    Packs/day: 0.50    Types: Cigarettes  . Smokeless tobacco: Never Used  Substance Use Topics  . Alcohol use: No  . Drug use: No     Allergies   Patient has no known allergies.   Review of Systems Review of Systems  Constitutional: Negative for chills, diaphoresis, fatigue, fever and unexpected weight change.  HENT: Negative for congestion, rhinorrhea, sore throat and trouble swallowing.   Eyes: Negative for visual disturbance.  Respiratory: Positive for cough and shortness of breath. Negative for chest tightness, wheezing and stridor.   Cardiovascular: Positive for chest pain (Pt reports chest soreness is chronic and unchanged.). Negative for palpitations and leg swelling.  Gastrointestinal: Negative for abdominal pain, blood in stool, diarrhea, nausea and vomiting.  Endocrine: Negative for cold intolerance and heat intolerance.  Genitourinary: Negative for dysuria.  Musculoskeletal: Negative for gait problem and myalgias.  Skin: Negative for pallor and rash.  Allergic/Immunologic: Negative for environmental allergies, food allergies and immunocompromised state.  Neurological: Negative for dizziness, syncope, speech difficulty, weakness, light-headedness and numbness.  Psychiatric/Behavioral: Positive for dysphoric mood and sleep disturbance. Negative for agitation, behavioral problems, confusion, decreased concentration, hallucinations, self-injury and suicidal ideas. The patient is nervous/anxious. The patient is not hyperactive.     Physical Exam Updated Vital Signs BP (!) 153/99   Pulse 89   Temp 97.8 F (36.6 C)   Resp 20   Ht 5\' 10"  (1.778 m)   Wt 102.1 kg   SpO2 97%   BMI 32.28 kg/m   Physical Exam Vitals signs and nursing note reviewed.  Constitutional:      General: He is not in acute distress.    Appearance: He is well-developed. He is not diaphoretic.  HENT:     Head: Normocephalic and atraumatic.  Neck:      Musculoskeletal: Normal range of motion.  Cardiovascular:     Rate and Rhythm: Normal rate and regular rhythm.     Heart sounds: Normal heart sounds. No murmur. No friction rub. No gallop.   Pulmonary:     Effort: Pulmonary effort is normal. No respiratory distress.     Breath sounds: Normal breath sounds. No wheezing or rales.     Comments: Patient is speaking in full sentences without difficulty. Patient's oxygen saturation is 95% on room air. Chest:     Chest wall: No tenderness.  Abdominal:     Palpations: Abdomen is soft.     Tenderness: There is no abdominal tenderness. There is no right CVA tenderness, left CVA tenderness or guarding.  Musculoskeletal: Normal range of motion.  Skin:    General: Skin is warm.     Findings: No erythema or rash.  Neurological:     Mental Status: He is alert and oriented to person, place, and time.     ED Treatments / Results  Labs (all labs ordered are listed, but only abnormal results are displayed) Labs  Reviewed  CBC WITH DIFFERENTIAL/PLATELET - Abnormal; Notable for the following components:      Result Value   WBC 12.8 (*)    Neutro Abs 9.8 (*)    All other components within normal limits  COMPREHENSIVE METABOLIC PANEL - Abnormal; Notable for the following components:   Glucose, Bld 230 (*)    Calcium 7.3 (*)    All other components within normal limits  RAPID URINE DRUG SCREEN, HOSP PERFORMED - Abnormal; Notable for the following components:   Benzodiazepines POSITIVE (*)    All other components within normal limits  URINALYSIS, ROUTINE W REFLEX MICROSCOPIC  ETHANOL    EKG EKG Interpretation  Date/Time:  Tuesday May 08 2019 10:20:41 EDT Ventricular Rate:  83 PR Interval:    QRS Duration: 101 QT Interval:  386 QTC Calculation: 454 R Axis:   -98 Text Interpretation:  Sinus rhythm Left anterior fascicular block No acute changes No significant change since last tracing Confirmed by Varney Biles ([id number]) on 05/08/2019  11:00:01 AM   Radiology Dg Chest Port 1 View  Result Date: 05/08/2019 CLINICAL DATA:  Weakness. EXAM: PORTABLE CHEST 1 VIEW COMPARISON:  Chest x-ray 05/05/2019. FINDINGS: The heart is borderline enlarged but appears stable given the AP projection and portable technique. Mild tortuosity and calcification of the thoracic aorta. The lungs are clear of an acute process. No infiltrates or effusions. No worrisome pulmonary lesions. The bony thorax is intact. IMPRESSION: No acute cardiopulmonary findings. Electronically Signed   By: Marijo Sanes M.D.   On: 05/08/2019 11:29    Procedures Procedures (including critical care time)  Medications Ordered in ED Medications - No data to display   Initial Impression / Assessment and Plan / ED Course  I have reviewed the triage vital signs and the nursing notes.  Pertinent labs & imaging results that were available during my care of the patient were reviewed by me and considered in my medical decision making (see chart for details).  Clinical Course as of May 07 1225  Tue May 08, 2019  1043 Leukocytosis noted at 12.8. This appears to have improved compared to previous values.  WBC(!): 12.8 [AH]  1143 No acute cardiopulmonary findings.  DG Chest Port 1 View [AH]    Clinical Course User Index [AH] Arville Lime, [place]      Patient presents with shortness of breath, anxiety, depression, and insomnia. CXR without acute cardiopulmonary findings. Mild leukocytosis noted at 12.8 which appears to have improved from previous values. Oxygen saturation stable at 97% on room air. No wheezing or rales on exam. EKG without acute changes. Suspect shortness of breath may be chronic due to COPD. Consulted TTS for further evaluation of anxiety, depression, and insomnia. Patient was evaluated by TTS a few days ago and recommended inpatient treatment, but patient refused at that time. Patient denies SI/HI or hallucinations. Today, TTS recommends outpatient treatment.  Will prescribe vistaril for insomnia and anxiety and advised patient to take OTC melatonin for insomnia. Discussed medication may cause drowsiness and patient should not drive or operate heavy machinery while taking this medication. Advised patient to follow up with PCP and behavioral health. Discussed return precautions. Patient states he understands and agrees with plan.    Findings and plan of care discussed with supervising physician Dr. Kathrynn Humble.   Final Clinical Impressions(s) / ED Diagnoses   Final diagnoses:  Insomnia, unspecified type  Shortness of breath  Anxiety  Depression, unspecified depression type    ED Discharge Orders  None       Arville Lime, [place] 05/08/19 1336    Varney Biles, MD 05/09/19 1112

## 2019-05-08 NOTE — ED Notes (Signed)
Pt reports he ran out of his Lorazepam a few days ago. Pt also reports he stopped taking Bupropion about 5 days ago because "it made me so nervous".

## 2019-05-08 NOTE — Telephone Encounter (Signed)
Patient contacted me yesterday about a new patient appointment, I passed his information on to [facility], our referral coordinator. Patient was at [facility] 7/25 and was recommended inpatient but at that time declined and went home. Patient called me this morning and he is worse, he has not slept in 6-7 days, his depression has increased, he feels overwhelmed and tearful. I have advised patient to go back to [facility] - I called TTS and [facility] to inform them that patient is coming in.

## 2019-05-08 NOTE — ED Notes (Signed)
TTS machine at bedside. 

## 2019-05-08 NOTE — ED Notes (Signed)
[facility] called and states pt is psych cleared.

## 2019-05-08 NOTE — BH Assessment (Signed)
Tele Assessment Note   Patient Name: Jose Burch MRN: [id number] Referring Physician: Julienne Kass Location of Patient: [facility] Location of Provider: [place] is an 68 y.o. male present to [facility] with complaint that he's not able to sleep. Patient report he came back to the hospital because he still cannot sleep and it takes a long time to get an outpatient provider appointment. Report the doctor gave him Ambien Sunday in the ED and all that done was give him a headache. Report, "I am wore out, I have not slept in over six days." Denied suicidal / homicidal ideations, denied auditory / visual hallucinations. Report he's old and he's not the man he used to be. Report stressors include taking care of he 68 year old, inside and out household duties, and being isolated due to [place]. Patient pleasant during assessment and continue to repeat, "I am so tired."   TTS was able to get patient a psychiatric appointment at [facility]. The appointment is scheduled for Friday July 31 @ 9:40am. The appointment is a virtual visit. Patient was informed a text message would be sent to his wife phone and he must follow the instructions for the virtual visit.    Disposition: Jose Maes, NP, patient is psych-cleared   Diagnosis: MDD    Past Medical History:  Past Medical History:  Diagnosis Date  . Cancer ([place])    Thyroid  . COPD (chronic obstructive pulmonary disease) ([place])   . Hypertension   . Insomnia   . Thyroid disease   . Tobacco abuse     Past Surgical History:  Procedure Laterality Date  . skin cancer removed    . THYROIDECTOMY  2015    Family History:  Family History  Problem Relation Age of Onset  . Cancer Mother   . Heart attack Sister     Social History:  reports that he has been smoking cigarettes. He has been smoking about 0.50 packs per day. He has never used smokeless tobacco.  He reports that he does not drink alcohol or use drugs.  Additional Social History:  Alcohol / Drug Use Pain Medications: see MAR Prescriptions: see MAR Over the Counter: see MAR History of alcohol / drug use?: No history of alcohol / drug abuse  CIWA: CIWA-Ar BP: (!) 151/78 Pulse Rate: 77 COWS:    Allergies: No Known Allergies  Home Medications: (Not in a hospital admission)   OB/GYN Status:  No LMP for male patient.  General Assessment Data Location of Assessment: [facility] TTS Assessment: In system Is this a Tele or Face-to-Face Assessment?: Tele Assessment Is this an Initial Assessment or a Re-assessment for this encounter?: Initial Assessment Patient Accompanied by:: N/A Language Other than English: No Living Arrangements: Other (Comment) What gender do you identify as?: Male Marital status: Married Living Arrangements: Spouse/significant other Can pt return to current living arrangement?: Yes Admission Status: Voluntary Is patient capable of signing voluntary admission?: Yes Referral Source: Self/Family/Friend Insurance type: [employer]     Crisis Care Plan Living Arrangements: Spouse/significant other Name of Psychiatrist: none Name of Therapist: none  Education Status Is patient currently in school?: No Is the patient employed, unemployed or receiving disability?: Receiving disability income  Risk to self with the past 6 months Suicidal Ideation: No Has patient been a risk to self within the past 6 months prior to admission? : No Suicidal Intent: No Has patient had any  suicidal intent within the past 6 months prior to admission? : No Is patient at risk for suicide?: No Suicidal Plan?: No Has patient had any suicidal plan within the past 6 months prior to admission? : No Access to Means: No What has been your use of drugs/alcohol within the last 12 months?: none reported  Previous Attempts/Gestures: No How many times?: 0 Other Self Harm Risks:  none reported  Triggers for Past Attempts: None known Intentional Self Injurious Behavior: None Family Suicide History: No Recent stressful life event(s): Other (Comment)(inability to sleep, suffer) Persecutory voices/beliefs?: No Depression: Yes Depression Symptoms: Despondent, Insomnia Substance abuse history and/or treatment for substance abuse?: No  Risk to Others within the past 6 months Homicidal Ideation: No Does patient have any lifetime risk of violence toward others beyond the six months prior to admission? : No Thoughts of Harm to Others: No Current Homicidal Intent: No Current Homicidal Plan: No Access to Homicidal Means: No History of harm to others?: No Assessment of Violence: None Noted Violent Behavior Description: None Noted  Does patient have access to weapons?: Yes (Comment) Criminal Charges Pending?: No Does patient have a court date: No Is patient on probation?: No  Psychosis Hallucinations: None noted Delusions: None noted  Mental Status Report Appearance/Hygiene: In scrubs, Disheveled Eye Contact: Good Motor Activity: Freedom of movement Speech: Logical/coherent Level of Consciousness: Alert Mood: Pleasant, Depressed Affect: Depressed Anxiety Level: Minimal Thought Processes: Coherent, Relevant Judgement: Partial Orientation: Appropriate for developmental age Obsessive Compulsive Thoughts/Behaviors: None  Cognitive Functioning Concentration: Fair Memory: Recent Intact, Remote Intact Is patient IDD: No Insight: Good Impulse Control: Good Appetite: Good Have you had any weight changes? : No Change Sleep: Decreased Total Hours of Sleep: 2 Vegetative Symptoms: None  ADLScreening [facility] Assessment Services) Patient's cognitive ability adequate to safely complete daily activities?: Yes Patient able to express need for assistance with ADLs?: Yes Independently performs ADLs?: Yes (appropriate for developmental age)  Prior Inpatient  Therapy Prior Inpatient Therapy: No  Prior Outpatient Therapy Prior Outpatient Therapy: No Does patient have an ACCT team?: No Does patient have Intensive In-House Services?  : No Does patient have [facility] services? : No Does patient have P4CC services?: No  ADL Screening (condition at time of admission) Patient's cognitive ability adequate to safely complete daily activities?: Yes Is the patient deaf or have difficulty hearing?: No Does the patient have difficulty seeing, even when wearing glasses/contacts?: No Does the patient have difficulty concentrating, remembering, or making decisions?: No Patient able to express need for assistance with ADLs?: Yes Does the patient have difficulty dressing or bathing?: No Independently performs ADLs?: Yes (appropriate for developmental age) Does the patient have difficulty walking or climbing stairs?: No       Abuse/Neglect Assessment (Assessment to be complete while patient is alone) Abuse/Neglect Assessment Can Be Completed: Yes Physical Abuse: Denies Verbal Abuse: Denies Sexual Abuse: Denies Exploitation of patient/patient's resources: Denies Self-Neglect: Denies     Regulatory affairs officer (For Healthcare) Does Patient Have a Medical Advance Directive?: No Would patient like information on creating a medical advance directive?: No - Patient declined          Disposition:  Disposition Initial Assessment Completed for this Encounter: Kathrene Bongo, NP, pt psychch-cleared )  This service was provided via telemedicine using a 2-way, interactive audio and video technology.  Names of all persons participating in this telemedicine service and their role in this encounter. Name: Jose Burch  Role: patient   Name:  K. Elenore Wanninger  Role:  TTS   Name:  Role:   Name:  Role:     Despina Hidden 05/08/2019 1:28 PM
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
## 2019-05-10 ENCOUNTER — Telehealth (HOSPITAL_COMMUNITY): Payer: Self-pay | Admitting: *Deleted

## 2019-05-10 NOTE — Telephone Encounter (Signed)
INFORMATION SHARED BY THE [facility], RN  ** 2 er visit c/o of insomnia. called nurse line with c/o insomnia & requested "something for his nerves".  told [facility] nurse that he hasn't slept in 7 nights & fills overwhelmed. tried & failed wellbutrin , ambien  & prozac. not sucidial & doesn't want to die just wants help for his insomnia & anxiety  stated he has some depression & feels like it may be worsening.[facility] nurse Santiago Glad  [phone]

## 2019-05-11 ENCOUNTER — Other Ambulatory Visit: Payer: Self-pay

## 2019-05-11 ENCOUNTER — Encounter (HOSPITAL_COMMUNITY): Payer: Self-pay | Admitting: Psychiatry

## 2019-05-11 ENCOUNTER — Ambulatory Visit (INDEPENDENT_AMBULATORY_CARE_PROVIDER_SITE_OTHER): Payer: PPO | Admitting: Psychiatry

## 2019-05-11 DIAGNOSIS — G4700 Insomnia, unspecified: Secondary | ICD-10-CM

## 2019-05-11 DIAGNOSIS — F331 Major depressive disorder, recurrent, moderate: Secondary | ICD-10-CM | POA: Diagnosis not present

## 2019-05-11 MED ORDER — MIRTAZAPINE 15 MG PO TABS: ORAL_TABLET | ORAL | 0 refills | Status: DC

## 2019-05-11 MED ORDER — TRAZODONE HCL 50 MG PO TABS: ORAL_TABLET | ORAL | 0 refills | Status: DC

## 2019-05-11 NOTE — Patient Instructions (Signed)
1. Start mirtazapine 7.5 mg for one week, then 15 mg at night 2. Start Trazodone 25-50 mg at night as needed for sleep 3. (Discontinue Ambien, lorazepam, bupropion) 4. Please contact your doctor about your recent thyroid test 5. Next appointment: 8/10 at 2:20

## 2019-05-14 ENCOUNTER — Other Ambulatory Visit (HOSPITAL_COMMUNITY): Payer: Self-pay | Admitting: Psychiatry

## 2019-05-14 ENCOUNTER — Telehealth (HOSPITAL_COMMUNITY): Payer: Self-pay | Admitting: *Deleted

## 2019-05-14 NOTE — Telephone Encounter (Signed)
SPOKE WITH PATIENT & HE STATED HE NEEDS A REFILL ON THE LORAZEPAM  HE DID CHECK THE BOTTLES  & HE DOES NEED A REFILL

## 2019-05-14 NOTE — Telephone Encounter (Signed)
He may reinitiate lorazepam 2 mg at night. Advise him to take lower dose if it causes him drowsiness. Hold Trazodone. I believe he would have enough medication left until the next visit.

## 2019-05-14 NOTE — Telephone Encounter (Signed)
Patient called & had question about his medication @ 1st he thought maybe the Rx filled prescriptions incorrectly. After checking the Office Notes  & reviewing assessment  & plan  I informed patient that Mirtazapine  & Trazodone were ordered & the Ambien, Lorazepam & Bupropion are discontinued.  He then stated he wish he could have remember to tell you that he tried Trazodone before  & it didn't work. That's when he started Lorazepam which is the only thing that  does help him sleep. Requesting to go back on the Lorazepam

## 2019-05-14 NOTE — Telephone Encounter (Signed)
PATIENT HAS THAT REFILL  & HAS TAKEN ALL THE MED'S HE'S OUT. HAS BEEN INFORMED NEXT APPT IS  8/10 & DISCUSS WITH YOU THEN

## 2019-05-14 NOTE — Telephone Encounter (Signed)
I believe it was ordered on 7/17 for 30 days (by other provider), and I will see him on 8/10. I will not be able to do refill at this time.

## 2019-05-16 NOTE — Progress Notes (Signed)
Virtual Visit via Video Note  I connected with Jose Burch on 05/21/19 at  2:20 PM EDT by a video enabled telemedicine application and verified that I am speaking with the correct person using two identifiers.   I discussed the limitations of evaluation and management by telemedicine and the availability of in person appointments. The patient expressed understanding and agreed to proceed.     I discussed the assessment and treatment plan with the patient. The patient was provided an opportunity to ask questions and all were answered. The patient agreed with the plan and demonstrated an understanding of the instructions.   The patient was advised to call back or seek an in-person evaluation if the symptoms worsen or if the condition fails to improve as anticipated.  I provided 25 minutes of non-face-to-face time during this encounter.   [name], MD     [facility] MD/PA/NP OP Progress Note  05/21/2019 3:07 PM Jose Burch  MRN:  [id number]  Chief Complaint:  Chief Complaint    Depression; Follow-up     HPI:  This is a follow-up appointment for depression.  He states that he was able to sleep 4 hours since Saturday.  He feels slightly better thanks to sleep. He enjoyed breakfast with his wife this morning. He tends to stay at home otherwise, and feels that he is in "prison."  He agrees to try taking a walk during the day. He hopes to be productive, although he can't do things as he used to due to his medical condition. He is planning to do mowing this afternoon. He is now motivated to quit smoking (smokes 1/2 PPD), which would help for his medical condition. He agrees to contact his PCP for pharmacological intervention.  He feels down at times.  He has fair motivation.  He has fair concentration.  He denies SI.  He tends to be worried/anxious about his family due to pandemic. He denies panic attacks. He feels drowsy during the day after uptitration of mirtazapine; he agrees to contact  the office if that drowsiness continues, although he wishes to continue this medication.    Visit Diagnosis:    ICD-10-CM   1. MDD (major depressive disorder), recurrent episode, moderate (HCC)  F33.1   2. Insomnia, unspecified type  G47.00     Past Psychiatric History: Please see initial evaluation for full details. I have reviewed the history. No updates at this time.     Past Medical History:  Past Medical History:  Diagnosis Date  . Cancer ([place])    Thyroid  . COPD (chronic obstructive pulmonary disease) ([place])   . Hypertension   . Insomnia   . Thyroid disease   . Tobacco abuse     Past Surgical History:  Procedure Laterality Date  . skin cancer removed    . THYROIDECTOMY  2015    Family Psychiatric History: Please see initial evaluation for full details. I have reviewed the history. No updates at this time.     Family History:  Family History  Problem Relation Age of Onset  . Cancer Mother   . Heart attack Sister     Social History:  Social History   Socioeconomic History  . Marital status: Married    Spouse name: Not on file  . Number of children: Not on file  . Years of education: Not on file  . Highest education level: Not on file  Occupational History  . Not on file  Social Needs  . Financial  resource strain: Not on file  . Food insecurity    Worry: Not on file    Inability: Not on file  . Transportation needs    Medical: Not on file    Non-medical: Not on file  Tobacco Use  . Smoking status: Current Every Day Smoker    Packs/day: 0.50    Types: Cigarettes  . Smokeless tobacco: Never Used  Substance and Sexual Activity  . Alcohol use: No  . Drug use: No  . Sexual activity: Never    Partners: Female  Lifestyle  . Physical activity    Days per week: Not on file    Minutes per session: Not on file  . Stress: Not on file  Relationships  . Social Herbalist on phone: Not on file    Gets together: Not on file    Attends  religious service: Not on file    Active member of club or organization: Not on file    Attends meetings of clubs or organizations: Not on file    Relationship status: Not on file  Other Topics Concern  . Not on file  Social History Narrative  . Not on file    Allergies: No Known Allergies  Metabolic Disorder Labs: No results found for: HGBA1C, MPG No results found for: PROLACTIN No results found for: CHOL, TRIG, HDL, CHOLHDL, VLDL, LDLCALC Lab Results  Component Value Date   TSH 0.158 (L) 05/05/2019   TSH 1.02 03/26/2019    Therapeutic Level Labs: No results found for: LITHIUM No results found for: VALPROATE No components found for:  CBMZ  Current Medications: Current Outpatient Medications  Medication Sig Dispense Refill  . ADVAIR DISKUS 100-50 MCG/DOSE AEPB INHALE 1 PUFF 2 (TWO) TIMES DAILY INTO THE LUNGS 180 each 0  . amLODipine (NORVASC) 10 MG tablet Take 10 mg by mouth daily.    Marland Kitchen aspirin EC 81 MG tablet Take 1 tablet (81 mg total) by mouth daily. (Patient not taking: Reported on 05/08/2019)    . calcitRIOL (ROCALTROL) 0.5 MCG capsule Take 1 capsule (0.5 mcg total) by mouth daily. 90 capsule 2  . calcium carbonate (TUMS EX) 750 MG chewable tablet Chew 1 tablet (750 mg total) by mouth 3 (three) times daily with meals. 180 tablet 3  . hydrOXYzine (ATARAX/VISTARIL) 25 MG tablet Take 1 tablet (25 mg total) by mouth every 6 (six) hours. 12 tablet 0  . levothyroxine (SYNTHROID) 175 MCG tablet Take 1 tablet (175 mcg total) by mouth daily before breakfast. 90 tablet 3  . LORazepam (ATIVAN) 2 MG tablet Take 1 tablet by mouth at bedtime.  0  . losartan (COZAAR) 25 MG tablet TAKE 2 TABLETS BY MOUTH EVERY DAY (Patient taking differently: 25 mg. ) 14 tablet 0  . [START ON 06/11/2019] mirtazapine (REMERON) 15 MG tablet Take 1 tablet (15 mg total) by mouth at bedtime. 30 tablet 0  . traMADol (ULTRAM) 50 MG tablet Take 50 mg by mouth every 6 (six) hours as needed.    . zolpidem (AMBIEN)  5 MG tablet Take 1 tablet (5 mg total) by mouth at bedtime as needed for up to 7 days for sleep. (Patient not taking: Reported on 05/08/2019) 7 tablet 0   No current facility-administered medications for this visit.      Musculoskeletal: Strength & Muscle Tone: N/A Gait & Station: N/A Patient leans: N/A  Psychiatric Specialty Exam: Review of Systems  Psychiatric/Behavioral: Positive for depression. Negative for hallucinations, memory loss, substance abuse  and suicidal ideas. The patient is nervous/anxious and has insomnia.   All other systems reviewed and are negative.   There were no vitals taken for this visit.There is no height or weight on file to calculate BMI.  General Appearance: Fairly Groomed  Eye Contact:  Good  Speech:  Clear and Coherent  Volume:  Normal  Mood:  Anxious  Affect:  Appropriate and less tense  Thought Process:  Coherent  Orientation:  Full (Time, Place, and Person)  Thought Content: Logical   Suicidal Thoughts:  No  Homicidal Thoughts:  No  Memory:  Immediate;   Good  Judgement:  Good  Insight:  Good  Psychomotor Activity:  Normal  Concentration:  Concentration: Good and Attention Span: Good  Recall:  Good  Fund of Knowledge: Good  Language: Good  Akathisia:  No  Handed:  Right  AIMS (if indicated): not done  Assets:  Communication Skills Desire for Improvement  ADL's:  Intact  Cognition: WNL  Sleep:  Poor- improving   Screenings: PHQ2-9     Office Visit from 02/13/2018 in [facility] Office Visit from 08/15/2017 in [facility] Office Visit from 02/10/2017 in [facility] Office Visit from 01/13/2017 in [facility]  PHQ-2 Total Score  0  0  0  0       Assessment and Plan:  Jose Burch is a 68 y.o. year old male with a history of depression, insomnia, malignant neoplasm of thyroid gland s/p thyroidectomy in 2015. ablation , postsurgical  hypothyroidism, hypocalcemia, COPD, who presents for follow up appointment for depression, insomnia.    # MDD, moderate, recurrent without psychotic features R/o depressive disorder due to another medical condition There has been more improvement in depressive symptoms and anxiety as his insomnia improves.  Psychosocial stressors includes taking care of his wife, retirement in 2016, and he is demoralized by his medical condition.  Will continue mirtazapine at the current dose to target depression, anxiety, appetite loss and insomnia.  Discussed potential risk of drowsiness and metabolic side effect.  He is advised to contact the office if he cannot tolerate this medication due to reported drowsiness in the morning.  He is also advised to limit taking Ativan given its potential side effect of fall/drowsiness.  He will greatly benefit from CBT; will make referral.   # Insomnia R/o sleep apnea He responds well to mirtazapine. Will continue current dose.  Noted that he does have history of snoring; will consider referral for sleep study in the future.   # post surgical hypothyroidism Per chart review, although his TSH was wnl in June, it is lower than normal range in July (fT4 not available). He takes medication regularly.  He is advised again to contact his endocrinologist for further evaluation.   Plan I have reviewed and updated plans as below 1. Continue mirtazapine 15 mg at night 2. Continue lorazepam 2 mg daily as needed for anxiety- prescribed by PCP 3. Referral to therapy  4. Next appointment: 9/14 at 2:30 for 30 mins, video 5. Please contact your doctor about your recent thyroid test 6. Will consider referral to sleep apnea in the future  Past trials of medication: sinequan (doxepin), fluoxetine, bupropion, Ambien, Trazodone,   The patient demonstrates the following risk factors for suicide: Chronic risk factors for suicide include: psychiatric disorder of depression and medical  illness of COPD, thyroid cancer. Acute risk factors for suicide include: unemployment and loss (financial, interpersonal, professional). Protective factors for this  patient include: positive social support, coping skills and hope for the future. Considering these factors, the overall suicide risk at this point appears to be low. Patient is appropriate for outpatient follow up.  The duration of this appointment visit was 25 minutes of non face-to-face time with the patient.  Greater than 50% of this time was spent in counseling, explanation of  diagnosis, planning of further management, and coordination of care.  [name], MD 05/21/2019, 3:07 PM

## 2019-05-21 ENCOUNTER — Encounter (HOSPITAL_COMMUNITY): Payer: Self-pay | Admitting: Psychiatry

## 2019-05-21 ENCOUNTER — Other Ambulatory Visit: Payer: Self-pay

## 2019-05-21 ENCOUNTER — Ambulatory Visit (INDEPENDENT_AMBULATORY_CARE_PROVIDER_SITE_OTHER): Payer: PPO | Admitting: Psychiatry

## 2019-05-21 DIAGNOSIS — F331 Major depressive disorder, recurrent, moderate: Secondary | ICD-10-CM

## 2019-05-21 DIAGNOSIS — G4700 Insomnia, unspecified: Secondary | ICD-10-CM | POA: Diagnosis not present

## 2019-05-21 MED ORDER — MIRTAZAPINE 15 MG PO TABS: 15.0000 mg | ORAL_TABLET | Freq: Every day | ORAL | 0 refills | Status: DC

## 2019-05-21 NOTE — Patient Instructions (Signed)
1. Continue mirtazapine 15 mg at night 2. Continue lorazepam 2 mg daily as needed for anxiety- prescribed by PCP 3. Referral to therapy  4. Next appointment: 9/14 at 2:30  5. Please contact your doctor about your recent thyroid test

## 2019-05-22 ENCOUNTER — Telehealth: Payer: Self-pay | Admitting: "Endocrinology

## 2019-05-22 ENCOUNTER — Other Ambulatory Visit: Payer: Self-pay | Admitting: "Endocrinology

## 2019-05-22 DIAGNOSIS — E890 Postprocedural hypothyroidism: Secondary | ICD-10-CM

## 2019-05-22 MED ORDER — LEVOTHYROXINE SODIUM 150 MCG PO TABS: 150.0000 ug | ORAL_TABLET | Freq: Every day | ORAL | 6 refills | Status: DC

## 2019-05-22 NOTE — Telephone Encounter (Signed)
Called patient and scheduled him for 6 months. Can you update his labs?

## 2019-05-22 NOTE — Telephone Encounter (Signed)
Pt was in ER on 7/28 and said that his Thyroid levels were high and would like you to look at those.

## 2019-05-22 NOTE — Telephone Encounter (Signed)
I spoke with him and adjusted his thyroid hormone.  Please give him a new schedule for 6 months with his labs.

## 2019-05-23 NOTE — Telephone Encounter (Signed)
Lab order sent

## 2019-06-06 ENCOUNTER — Telehealth (HOSPITAL_COMMUNITY): Payer: Self-pay | Admitting: *Deleted

## 2019-06-06 ENCOUNTER — Other Ambulatory Visit (HOSPITAL_COMMUNITY): Payer: Self-pay | Admitting: Psychiatry

## 2019-06-06 MED ORDER — MIRTAZAPINE 15 MG PO TABS: 15.0000 mg | ORAL_TABLET | Freq: Every day | ORAL | 0 refills | Status: DC

## 2019-06-06 NOTE — Telephone Encounter (Signed)
SPOKE WITH [facility] TO CHECK ON  There is an order to start on 8/31. He should have enough meds. THERE ISN'T ANYTHING ON HOLD /FILE FOR MIRTAZAPINE

## 2019-06-06 NOTE — Telephone Encounter (Signed)
Please double check with them. Per Epic, Receipt confirmed by pharmacy (05/21/2019  2:54 PM EDT)

## 2019-06-06 NOTE — Telephone Encounter (Signed)
PATIENT CALLED STATED HE NEED REFILL FOR MIRTAZAPINE THE Rx FILLED ON 04/20/2019 ( Receipt confirmed by pharmacy (05/21/2019 TO START ON  05/24/2019 ) & HIS NEXT APPT IS 06/25/2019 & THAT HE DOESN'T HAVE ENOUGH TO LAST

## 2019-06-06 NOTE — Telephone Encounter (Signed)
Re ordered

## 2019-06-06 NOTE — Telephone Encounter (Signed)
DOUBLE CHECKED  & MENTIONED  ( Receipt confirmed by pharmacy (05/21/2019 TO START ON 05/24/2019 ) AND THE Rx SAYS THEY HAVE NOTHING ON HOLD Jose Burch

## 2019-06-06 NOTE — Telephone Encounter (Signed)
There is an order to start on 8/31. He should have enough meds.

## 2019-06-08 ENCOUNTER — Other Ambulatory Visit: Payer: Self-pay

## 2019-06-08 ENCOUNTER — Encounter: Payer: Self-pay | Admitting: Allergy & Immunology

## 2019-06-08 ENCOUNTER — Ambulatory Visit (INDEPENDENT_AMBULATORY_CARE_PROVIDER_SITE_OTHER): Payer: PPO | Admitting: Allergy & Immunology

## 2019-06-08 VITALS — BP 160/90 | HR 89 | Temp 98.0°F | Resp 18 | Ht 70.0 in | Wt 225.0 lb

## 2019-06-08 DIAGNOSIS — J3089 Other allergic rhinitis: Secondary | ICD-10-CM | POA: Diagnosis not present

## 2019-06-08 DIAGNOSIS — J302 Other seasonal allergic rhinitis: Secondary | ICD-10-CM

## 2019-06-08 DIAGNOSIS — J449 Chronic obstructive pulmonary disease, unspecified: Secondary | ICD-10-CM

## 2019-06-08 NOTE — Progress Notes (Signed)
NEW PATIENT  Date of Service/Encounter:  06/08/19  Referring provider: Andres Shad, MD   Assessment:   Asthma-COPD overlap syndrome (grasses, weeds, trees, indoor molds, outdoor molds, dust mites, dog and cockroach) - very poorly controlled   Seasonal and perennial allergic rhinitis  Plan/Recommendations:   1. Asthma-COPD overlap syndrome - Lung testing was in the 40% range, but it did improve with the use of the albuterol puffs. - I am going to review your lung testing from [facility], but given the combination of the lung findings and the chronic mucous production, I think COPD is a definite possibility.  - Given this, I think it is important that you restart a daily controller medication so that we do not continue to lose lung function. - We are not going to use Advair since you had such a bad reaction to it, but we will instead try Trelegy which contains a long acting albuterol to keep your lungs open and another long acting medication to keep your lungs open in combination with an inhaled steroid to help with decreasing inflammation within the lungs.  - We are going to put you on a steroid burst to see if this can help to improve the lung function.  - Daily controller medication(s): Trelegy 100/62.5/25 one puff once daily - Prior to physical activity: albuterol 2 puffs 10-15 minutes before physical activity. - Rescue medications: albuterol 4 puffs every 4-6 hours as needed - Asthma control goals:  * Full participation in all desired activities (may need albuterol before activity) * Albuterol use two time or less a week on average (not counting use with activity) * Cough interfering with sleep two time or less a month * Oral steroids no more than once a year * No hospitalizations  2. Seasonal and perennial allergic rhinitis - Testing today showed: grasses, weeds, trees, indoor molds, outdoor molds, dust mites, dog and cockroach - Copy of test results provided.  -  Avoidance measures provided. - Start taking: Zyrtec (cetirizine) 10mg  tablet once daily and Nasacort (triamcinolone) two sprays per nostril daily - You can use an extra dose of the antihistamine, if needed, for breakthrough symptoms.  - Consider nasal saline rinses 1-2 times daily to remove allergens from the nasal cavities as well as help with mucous clearance (this is especially helpful to do before the nasal sprays are given) - Consider allergy shots as a means of long-term control. - Allergy shots "re-train" and "reset" the immune system to ignore environmental allergens and decrease the resulting immune response to those allergens (sneezing, itchy watery eyes, runny nose, nasal congestion, etc).    - Allergy shots improve symptoms in 75-85% of patients.  - We can discuss more at the next appointment if the medications are not working for you.   3. GERD - With the history of the mucous production being worse at night, I think GERD (reflux) might be contributing to your symptoms. - If you are still having the morning coughing with mucous production at the next visit, we may add on omeprazole (Prevacid) at night to see if this helps.  4. Return in about 2 weeks (around 06/22/2019). This can be an in-person follow up visit.    Subjective:   Jose Burch is a 68 y.o. male presenting today for evaluation of  Chief Complaint  Patient presents with  . Cough  . Wheezing  . Nasal Congestion    Worse in the morning    Jose Burch has a  history of the following: Patient Active Problem List   Diagnosis Date Noted  . Hypocalcemia 08/15/2017  . Malignant neoplasm of thyroid gland ([place]) 01/13/2017  . Postsurgical hypothyroidism 01/13/2017    History obtained from: chart review and patient.  Jose Burch was referred by Andres Shad, MD.     Demarre is a 68 y.o. male presenting for an evaluation of environmental allergies and breathing issues. He heard about [place] through Dr.  Dorris Fetch. He apparently has a history of thyroid cancer.    Asthma/Respiratory Symptom History: He does have a long smoking history. He was diagnosed around four years ago. He did a breathing test at [facility] and the "lady" said he had stage one COPD. He was placed on Advair 100/50 one puff twice daily. He reports that he developed neck swelling from the Advair. He denies any itchiness but he did have some chest soreness. He did rinse his mouth like was recommended. He uses this for one year in total. It did help his breathing but it was more discomfort associated with it. He threw it out two months ago. Since stopping it, he ironically feels better. He has a lot clear mucous in the mornings now. It does get better over the course of the day. He does report that he is having worsening symptoms outside of the home when he is outside. It seems that he never had an albuterol inhaler.   Allergic Rhinitis Symptom History: [place] He has 11 acres of land. Symptoms flare when he is outdoors. 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 He does endorse rhinorrhea that is worse in the mornings. He also has itchy watery eyes throughout the day. He has not tried much in the way of antihistamines. He has tried Benadryl but it makes him sleepy. He does have jitteriness from Zyrtec and Claritin. He has never been allergy tested and does not use any nasal sprays. There are dogs and cats in the home.  Pulmonary Function Testing (August 2018)   Otherwise, there is no history of other atopic diseases, including food allergies, drug allergies, stinging insect allergies, eczema, urticaria or contact dermatitis. There is no significant infectious history. Vaccinations are up to date.    Past Medical History: Patient Active Problem List    Diagnosis Date Noted  . Hypocalcemia 08/15/2017  . Malignant neoplasm of thyroid gland ([place]) 01/13/2017  . Postsurgical hypothyroidism 01/13/2017    Medication List:  Allergies as of 06/08/2019   No Known Allergies     Medication List       Accurate as of June 08, 2019 12:47 PM. If you have any questions, ask your nurse or doctor.        Advair Diskus 100-50 MCG/DOSE Aepb Generic drug: Fluticasone-Salmeterol INHALE 1 PUFF 2 (TWO) TIMES DAILY INTO THE LUNGS   amLODipine 10 MG tablet Commonly known as: NORVASC Take 10 mg by mouth daily.   aspirin EC 81 MG tablet Take 1 tablet (81 mg total) by mouth daily.   calcitRIOL 0.5 MCG capsule Commonly known as: ROCALTROL Take 1 capsule (0.5 mcg total) by mouth daily.   calcium carbonate 750 MG chewable tablet Commonly known as: TUMS EX Chew 1 tablet (750 mg total) by mouth 3 (three) times daily with meals.  hydrOXYzine 25 MG tablet Commonly known as: ATARAX/VISTARIL Take 1 tablet (25 mg total) by mouth every 6 (six) hours.   levothyroxine 150 MCG tablet Commonly known as: SYNTHROID Take 1 tablet (150 mcg total) by mouth daily before breakfast.   LORazepam 2 MG tablet Commonly known as: ATIVAN Take 1 tablet by mouth at bedtime.   losartan 25 MG tablet Commonly known as: COZAAR TAKE 2 TABLETS BY MOUTH EVERY DAY What changed:   how much to take  how to take this  when to take this   mirtazapine 15 MG tablet Commonly known as: REMERON Take 1 tablet (15 mg total) by mouth at bedtime. Start taking on: June 11, 2019   traMADol 50 MG tablet Commonly known as: ULTRAM Take 50 mg by mouth every 6 (six) hours as needed.   zolpidem 5 MG tablet Commonly known as: AMBIEN Take 1 tablet (5 mg total) by mouth at bedtime as needed for up to 7 days for sleep.       Birth History: non-contributory  Developmental History: non-contributory  Past Surgical History: Past Surgical History:  Procedure Laterality Date   . skin cancer removed    . THYROIDECTOMY  2015     Family History: Family History  Problem Relation Age of Onset  . Cancer Mother   . Asthma Mother   . Heart attack Sister   . Allergic rhinitis Neg Hx   . Eczema Neg Hx      Social History: Yusif lives at home with his wife.  They live in a house that is 76 years old.  There is laminate flooring throughout the home.  They have electric heating and central cooling.  There are cats and dogs inside of the home.  There are no dust mite covers on the bedding.  He is a smoker smoking half a pack per day.  He is smoked for 20 years.  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   Review of Systems  Constitutional: Negative.  Negative for chills, fever, malaise/fatigue and weight loss.  HENT: Positive for congestion, sinus pain and sore throat. Negative for ear discharge, ear pain and nosebleeds.   Eyes: Negative for pain, discharge and redness.  Respiratory: Positive for cough and shortness of breath. Negative for sputum production and wheezing.   Cardiovascular: Negative.  Negative for chest pain and palpitations.  Gastrointestinal: Negative for abdominal pain, constipation, diarrhea, heartburn, nausea and vomiting.  Skin: Negative.  Negative for itching and rash.  Neurological: Negative for dizziness and headaches.  Endo/Heme/Allergies: Negative for environmental allergies. Does not bruise/bleed easily.       Objective:   Blood pressure (!) 160/90, pulse 89, temperature 98 F (36.7 C), temperature source Temporal, resp. rate 18, height 5\' 10"  (1.778 m), weight 225 lb (102.1 kg), SpO2 95 %. Body mass index is 32.28 kg/m.   Physical Exam:   Physical Exam  Constitutional: He appears well-developed.  Pleasant appreciative male.  HENT:  Head: Normocephalic and atraumatic.  Right Ear: Tympanic membrane, external ear and ear canal normal. No drainage, swelling or tenderness. Tympanic membrane is not injected, not scarred,  not erythematous, not retracted and not bulging.  Left Ear: Tympanic membrane, external ear and ear canal normal. No drainage, swelling or tenderness. Tympanic membrane is not injected, not scarred, not erythematous, not retracted and not bulging.  Nose: Mucosal edema and rhinorrhea present. No nasal deformity or septal deviation. No epistaxis. Right sinus exhibits no maxillary sinus tenderness and  no frontal sinus tenderness. Left sinus exhibits no maxillary sinus tenderness and no frontal sinus tenderness.  Mouth/Throat: Uvula is midline and oropharynx is clear and moist. Mucous membranes are not pale and not dry.  He does have clear mucus stringing between the nasal passages.  Eyes: Pupils are equal, round, and reactive to light. Conjunctivae and EOM are normal. Right eye exhibits no chemosis and no discharge. Left eye exhibits no chemosis and no discharge. Right conjunctiva is not injected. Left conjunctiva is not injected.  Cardiovascular: Normal rate, regular rhythm and normal heart sounds.  Respiratory: Effort normal and breath sounds normal. No accessory muscle usage. No tachypnea. No respiratory distress. He has no wheezes. He has no rhonchi. He has no rales. He exhibits no tenderness.  Decreased air movement at the bases.  No wheezing or crackles appreciated.  GI: There is no abdominal tenderness. There is no rebound and no guarding.  Lymphadenopathy:       Head (right side): No submandibular, no tonsillar and no occipital adenopathy present.       Head (left side): No submandibular, no tonsillar and no occipital adenopathy present.    He has cervical adenopathy.       Right cervical: Superficial cervical adenopathy present.       Left cervical: Superficial cervical adenopathy present.  Neurological: He is alert.  Skin: No abrasion, no petechiae and no rash noted. Rash is not papular, not vesicular and not urticarial. No erythema. No pallor.  Psychiatric: He has a normal mood and affect.      Diagnostic studies:    Spirometry: results abnormal (FEV1: 1.25/41%, FVC: 1.88/42%, FEV1/FVC: 66%).    Spirometry consistent with mixed obstructive and restrictive disease. Albuterol four puffs via spacer treatment given in clinic with improvement in FEV1 and FVC, but not significant per ATS criteria.  Allergy Studies:    Airborne Adult Perc - 06/08/19 1001    Time Antigen Placed  1001    Allergen Manufacturer  Lavella Hammock    Location  Back    Number of Test  59    Panel 1  Select    1. Control-Buffer 50% Glycerol  Negative    2. Control-Histamine 1 mg/ml  2+    3. Albumin saline  Negative    4. Ferguson  Negative    5. [place]  Negative    6. Johnson  Negative    7. Swan[place] Blue  Negative    8. Meadow Fescue  Negative    9. Perennial Rye  2+    10. Sweet Vernal  Negative    11. Timothy  2+    12. Cocklebur  2+    13. Burweed Marshelder  Negative    14. Ragweed, short  Negative    15. Ragweed, Giant  Negative    16. Plantain,  English  2+    17. Lamb's Quarters  Negative    18. Sheep Sorrell  2+    19. Rough Pigweed  Negative    20. Marsh Elder, Rough  Negative    21. Mugwort, Common  2+    22. Ash mix  Negative    23. Birch mix  2+    24. Beech American  2+    25. Box, Elder  Negative    26. Cedar, red  Negative    27. Cottonwood, [place]  Negative    28. Elm mix  Negative    29. Hickory mix  2+    30. Maple mix  Negative    31. Oak, [place] mix  2+    32. Pecan Pollen  2+    33. Pine mix  2+    34. Sycamore Eastern  2+    35. [place], Black Pollen  Negative    36. Alternaria alternata  Negative    37. Cladosporium Herbarum  Negative    38. Aspergillus mix  Negative    39. Penicillium mix  Negative    40. Bipolaris sorokiniana (Helminthosporium)  Negative    41. Drechslera spicifera (Curvularia)  2+    42. Mucor plumbeus  2+    43. Fusarium moniliforme  2+    44. Aureobasidium pullulans (pullulara)  Negative    45. Rhizopus oryzae  Negative    46. Botrytis  cinera  2+    47. Epicoccum nigrum  Negative    48. Phoma betae  Negative    49. Candida Albicans  Negative    50. Trichophyton mentagrophytes  Negative    51. Mite, D Farinae  5,000 AU/ml  2+    52. Mite, D Pteronyssinus  5,000 AU/ml  2+    53. Cat Hair 10,000 BAU/ml  Negative    54.  Dog Epithelia  Negative    55. Mixed Feathers  Negative    56. Horse Epithelia  2+    57. Cockroach, German  Negative    58. Mouse  Negative    59. Tobacco Leaf  Negative     Intradermal - 06/08/19 1056    Time Antigen Placed  1054    Allergen Manufacturer  Lavella Hammock    Location  Arm    Number of Test  9    Control  Negative    [place]  1+    Johnson  1+    Ragweed mix  Negative    Mold 1  Negative    Mold 2  2+    Cat  Negative    Dog  3+    Cockroach  Negative       Allergy testing results were read and interpreted by myself, documented by clinical staff.         Salvatore Marvel, MD [facility]

## 2019-06-08 NOTE — Patient Instructions (Addendum)
1. Asthma-COPD overlap syndrome ([place]) - Lung testing was in the 40% range, but it did improve with the use of the albuterol puffs. - I am going to review your lung testing from [facility], but given the combination of the lung findings and the chronic mucous production, I think COPD is a definite possibility.  - Given this, I think it is important that you restart a daily controller medication so that we do not continue to lose lung function. - We are not going to use Advair since you had such a bad reaction to it, but we will instead try Trelegy which contains a long acting albuterol to keep your lungs open and another long acting medication to keep your lungs open in combination with an inhaled steroid to help with decreasing inflammation within the lungs.  - We are going to put you on a steroid burst to see if this can help to improve the lung function.  - Daily controller medication(s): Trelegy 100/62.5/25 one puff once daily - Prior to physical activity: albuterol 2 puffs 10-15 minutes before physical activity. - Rescue medications: albuterol 4 puffs every 4-6 hours as needed - Asthma control goals:  * Full participation in all desired activities (may need albuterol before activity) * Albuterol use two time or less a week on average (not counting use with activity) * Cough interfering with sleep two time or less a month * Oral steroids no more than once a year * No hospitalizations  2. Seasonal and perennial allergic rhinitis - Testing today showed: grasses, weeds, trees, indoor molds, outdoor molds, dust mites, dog and cockroach - Copy of test results provided.  - Avoidance measures provided. - Start taking: Zyrtec (cetirizine) 10mg  tablet once daily and Nasacort (triamcinolone) two sprays per nostril daily - You can use an extra dose of the antihistamine, if needed, for breakthrough symptoms.  - Consider nasal saline rinses 1-2 times daily to remove allergens from the nasal cavities as  well as help with mucous clearance (this is especially helpful to do before the nasal sprays are given) - Consider allergy shots as a means of long-term control. - Allergy shots "re-train" and "reset" the immune system to ignore environmental allergens and decrease the resulting immune response to those allergens (sneezing, itchy watery eyes, runny nose, nasal congestion, etc).    - Allergy shots improve symptoms in 75-85% of patients.  - We can discuss more at the next appointment if the medications are not working for you.   3. GERD - With the history of the mucous production being worse at night, I think GERD (reflux) might be contributing to your symptoms. - If you are still having the morning coughing with mucous production at the next visit, we may add on omeprazole (Prevacid) at night to see if this helps.  4. Return in about 2 weeks (around 06/22/2019). This can be an in-person follow up visit.   Please inform us of any Emergency Department visits, hospitalizations, or changes in symptoms. Call us before going to the ED for breathing or allergy symptoms since we might be able to fit you in for a sick visit. Feel free to contact us anytime with any questions, problems, or concerns.  It was a pleasure to meet you today!  Websites that have reliable patient information: 1. American Academy of Asthma, Allergy, and Immunology: www.aaaai.org 2. Food Allergy Research and Education (FARE): foodallergy.org 3. Mothers of Asthmatics: http://www.asthmacommunitynetwork.org 4. American College of Allergy, Asthma, and Immunology: www.acaai.org  "Like"  [place] on Facebook and Instagram for our latest updates!      Make sure you are registered to vote! If you have moved or changed any of your contact information, you will need to get this updated before voting!  In some cases, you MAY be able to register to vote online: [email or web address]    Voter ID laws are NOT going  into effect for the General Election in November 2020! DO NOT let this stop you from exercising your right to vote!   Absentee voting is the SAFEST way to vote during the coronavirus pandemic!   Download and print an absentee ballot request form at [email or web address] or you can scan the QR code below with your smart phone:      More information on absentee ballots can be found here: [email or web address]

## 2019-06-20 ENCOUNTER — Other Ambulatory Visit: Payer: Self-pay | Admitting: "Endocrinology

## 2019-06-21 ENCOUNTER — Telehealth: Payer: Self-pay

## 2019-06-21 MED ORDER — ALBUTEROL SULFATE HFA 108 (90 BASE) MCG/ACT IN AERS: 4.0000 | INHALATION_SPRAY | RESPIRATORY_TRACT | 1 refills | Status: DC | PRN

## 2019-06-21 NOTE — Telephone Encounter (Signed)
Pt states that the Trelegy made his chest hurt and face swell.  Wants to know if this is supposed to do that.  Pt states he has SOB all the time, and does not have an emergency inhaler, no albuterol. Per Dr Ernst Bowler, stop the Trelegy and will discuss at his visit tomorrow. Suggested that if he had any other issues with tightness of the throat and swelling that he should seek emergency care.  Pt agreed to this.  Per Dr Ernst Bowler, send in albuterol and ask pharmacy to instruct on use.  Pt also asking about Aspirin, has been having trouble with aspirin when taking, wants to know if he is allergic.

## 2019-06-21 NOTE — Progress Notes (Signed)
Virtual Visit via Video Note  I connected with Jose Burch on 06/25/19 at  2:30 PM EDT by a video enabled telemedicine application and verified that I am speaking with the correct person using two identifiers.   I discussed the limitations of evaluation and management by telemedicine and the availability of in person appointments. The patient expressed understanding and agreed to proceed.   I discussed the assessment and treatment plan with the patient. The patient was provided an opportunity to ask questions and all were answered. The patient agreed with the plan and demonstrated an understanding of the instructions.   The patient was advised to call back or seek an in-person evaluation if the symptoms worsen or if the condition fails to improve as anticipated.  I provided 25 minutes of non-face-to-face time during this encounter.   Jose Clay, MD    [facility] MD/PA/NP OP Progress Note  06/25/2019 3:01 PM Jose Burch  MRN:  [id number]  Chief Complaint:  Chief Complaint    Depression; Follow-up     HPI:  - Levothyroxine reduced to 150 mcg This is a follow-up appointment for depression.  He states that he was seen by allergy specialist.  He was told that his allergy to "everything." This made him feel worse. He feels depressed about his medical condition. He had shortness of breath after mowing glass the other day. He believes he has been getting on his wive's nerves as he is "sickly." He states that they are together "too much." (and smiles). He has initial and middle insomnia (sleeps 3-4 hours per day).  He snores at night.  He feels fatigue.  He has fair concentration.  He has good appetite.  He denies SI.  He feels anxious and tense at times.  He denies panic attacks.    Visit Diagnosis:    ICD-10-CM   1. MDD (major depressive disorder), recurrent episode, moderate (HCC)  F33.1   2. Insomnia, unspecified type  G47.00     Past Psychiatric History: Please see initial  evaluation for full details. I have reviewed the history. No updates at this time.     Past Medical History:  Past Medical History:  Diagnosis Date  . Asthma   . Cancer ([place])    Thyroid  . COPD (chronic obstructive pulmonary disease) ([place])   . Hypertension   . Insomnia   . Thyroid disease   . Tobacco abuse     Past Surgical History:  Procedure Laterality Date  . skin cancer removed    . THYROIDECTOMY  2015    Family Psychiatric History: Please see initial evaluation for full details. I have reviewed the history. No updates at this time.     Family History:  Family History  Problem Relation Age of Onset  . Cancer Mother   . Asthma Mother   . Heart attack Sister   . Allergic rhinitis Neg Hx   . Eczema Neg Hx     Social History:  Social History   Socioeconomic History  . Marital status: Married    Spouse name: Not on file  . Number of children: Not on file  . Years of education: Not on file  . Highest education level: Not on file  Occupational History  . Not on file  Social Needs  . Financial resource strain: Not on file  . Food insecurity    Worry: Not on file    Inability: Not on file  . Transportation needs    Medical: Not  on file    Non-medical: Not on file  Tobacco Use  . Smoking status: Current Every Day Smoker    Packs/day: 0.50    Types: Cigarettes  . Smokeless tobacco: Never Used  Substance and Sexual Activity  . Alcohol use: No  . Drug use: No  . Sexual activity: Never    Partners: Female  Lifestyle  . Physical activity    Days per week: Not on file    Minutes per session: Not on file  . Stress: Not on file  Relationships  . Social Herbalist on phone: Not on file    Gets together: Not on file    Attends religious service: Not on file    Active member of club or organization: Not on file    Attends meetings of clubs or organizations: Not on file    Relationship status: Not on file  Other Topics Concern  . Not on file   Social History Narrative  . Not on file    Allergies:  Allergies  Allergen Reactions  . Aspirin Hives    Metabolic Disorder Labs: No results found for: HGBA1C, MPG No results found for: PROLACTIN No results found for: CHOL, TRIG, HDL, CHOLHDL, VLDL, LDLCALC Lab Results  Component Value Date   TSH 0.158 (L) 05/05/2019   TSH 1.02 03/26/2019    Therapeutic Level Labs: No results found for: LITHIUM No results found for: VALPROATE No components found for:  CBMZ  Current Medications: Current Outpatient Medications  Medication Sig Dispense Refill  . albuterol (VENTOLIN HFA) 108 (90 Base) MCG/ACT inhaler Inhale 4 puffs into the lungs every 4 (four) hours as needed. 18 g 1  . amLODipine (NORVASC) 10 MG tablet Take 10 mg by mouth daily.    . calcitRIOL (ROCALTROL) 0.5 MCG capsule TAKE 1 CAPSULE (0.5 MCG TOTAL) BY MOUTH DAILY. 90 capsule 0  . calcium carbonate (TUMS EX) 750 MG chewable tablet Chew 1 tablet (750 mg total) by mouth 3 (three) times daily with meals. 180 tablet 3  . levothyroxine (SYNTHROID) 150 MCG tablet Take 1 tablet (150 mcg total) by mouth daily before breakfast. 30 tablet 6  . LORazepam (ATIVAN) 2 MG tablet Take 1 tablet by mouth at bedtime.  0  . losartan (COZAAR) 25 MG tablet TAKE 2 TABLETS BY MOUTH EVERY DAY (Patient taking differently: 25 mg. ) 14 tablet 0  . mirtazapine (REMERON) 30 MG tablet Take 1 tablet (30 mg total) by mouth at bedtime. 30 tablet 1  . traZODone (DESYREL) 50 MG tablet 25-50 mg at night as needed for sleep 30 tablet 1   No current facility-administered medications for this visit.      Musculoskeletal: Strength & Muscle Tone: N/A Gait & Station: N/A Patient leans: N/A  Psychiatric Specialty Exam: Review of Systems  Psychiatric/Behavioral: Positive for depression. Negative for hallucinations, memory loss, substance abuse and suicidal ideas. The patient is nervous/anxious and has insomnia.   All other systems reviewed and are  negative.   There were no vitals taken for this visit.There is no height or weight on file to calculate BMI.  General Appearance: Fairly Groomed  Eye Contact:  Good  Speech:  Clear and Coherent  Volume:  Normal  Mood:  "depressed"  Affect:  Appropriate, Congruent and slightly down, but reactive  Thought Process:  Coherent  Orientation:  Full (Time, Place, and Person)  Thought Content: Logical   Suicidal Thoughts:  No  Homicidal Thoughts:  No  Memory:  Immediate;  Good  Judgement:  Good  Insight:  Fair  Psychomotor Activity:  Normal  Concentration:  Concentration: Good and Attention Span: Good  Recall:  Good  Fund of Knowledge: Good  Language: Good  Akathisia:  No  Handed:  Right  AIMS (if indicated): not done  Assets:  Communication Skills Desire for Improvement  ADL's:  Intact  Cognition: WNL  Sleep:  Poor   Screenings: PHQ2-9     Office Visit from 02/13/2018 in [facility] Office Visit from 08/15/2017 in [facility] Office Visit from 02/10/2017 in [facility] Office Visit from 01/13/2017 in [facility]  PHQ-2 Total Score  0  0  0  0       Assessment and Plan:  ERASTUS NICASIO is a 68 y.o. year old male with a history of depression, insomnia,  malignant neoplasm of thyroid gland s/pthyroidectomyin2015. ablation , postsurgical hypothyroidism,hypocalcemia,COPD , who presents for follow up appointment for MDD (major depressive disorder), recurrent episode, moderate ([place])  Insomnia, unspecified type  # MDD, moderate, recurrent without psychotic features R/o depressive disorder due to another medical condition There is slight worsening in rumination on his medical health in the context of being seen by a specialist.  Other psychosocial stressors includes taking care of his wife, retirement in 2016, and he is demoralized by medical condition.  Will uptitrate mirtazapine to target  depression, anxiety and insomnia.  Discussed potential risk of drowsiness and metabolic side effect.  Will start trazodone as needed for insomnia; noted that he reports it has been effective for his insomnia, although he was reported it did not work.  Discussed potential risk of drowsiness/fall.  Although he will greatly benefit from CBT, he would like to hold this at this time.   # Insomnia R/o sleep apnea Although he does have snoring, daytime fatigue, he is not interested in pursuing sleep study. Will continue to discuss as needed.   He responds well to mirtazapine. Will continue current dose.  Noted that he does have history of snoring; will consider referral for sleep study in the future.   Plan I have reviewed and updated plans as below 1. Increase mirtazapine 30 mg at night 2. Continue lorazepam 2 mg daily as needed for anxiety- prescribed by PCP 3. Start Trazodone 25-50 mg at night as needed for sleep  4. Next appointment: 10/22 at 2 PM for 30 mins, video - he would like to hold off therapy - - Informed the patient that this examiner will be on leave starting around mid-November for a few months, and the patient will be seen by a covering provider if necessary during that time.     Past trials of medication:sinequan(doxepin), fluoxetine, bupropion, Ambien, Trazodone,   The patient demonstrates the following risk factors for suicide: Chronic risk factors for suicide include:psychiatric disorder ofdepressionand medical illness of COPD, thyroid cancer. Acute risk factorsfor suicide include: unemployment and loss (financial, interpersonal, professional). Protective factorsfor this patient include: positive social support, coping skills and hope for the future. Considering these factors, the overall suicide risk at this point appears to below. Patientisappropriate for outpatient follow up.  The duration of this appointment visit was 25 minutes of non face-to-face time with the  patient.  Greater than 50% of this time was spent in counseling, explanation of  diagnosis, planning of further management, and coordination of care.  Jose Clay, MD 06/25/2019, 3:01 PM

## 2019-06-21 NOTE — Telephone Encounter (Signed)
We will definitely talk about this tomorrow during his visit.  Salvatore Marvel, MD [facility]

## 2019-06-22 ENCOUNTER — Other Ambulatory Visit: Payer: Self-pay

## 2019-06-22 ENCOUNTER — Ambulatory Visit (INDEPENDENT_AMBULATORY_CARE_PROVIDER_SITE_OTHER): Payer: PPO | Admitting: Allergy & Immunology

## 2019-06-22 ENCOUNTER — Encounter: Payer: Self-pay | Admitting: Allergy & Immunology

## 2019-06-22 VITALS — BP 140/62 | HR 87 | Temp 98.6°F | Resp 22

## 2019-06-22 DIAGNOSIS — J449 Chronic obstructive pulmonary disease, unspecified: Secondary | ICD-10-CM

## 2019-06-22 DIAGNOSIS — J302 Other seasonal allergic rhinitis: Secondary | ICD-10-CM

## 2019-06-22 DIAGNOSIS — J3089 Other allergic rhinitis: Secondary | ICD-10-CM | POA: Diagnosis not present

## 2019-06-22 NOTE — Patient Instructions (Addendum)
1. Asthma-COPD overlap syndrom - Lung testing was in the 55-60% range today. - We are going to start Spiriva one puff once daily (keeps your lungs open for 24 hours at a time). - Sample provided.  - We are going to put you on a steroid burst to see if this can help to improve the lung function.  - Daily controller medication(s): Spiriva 2.5 mcg two puffs once daily - Prior to physical activity: albuterol 2 puffs 10-15 minutes before physical activity. - Rescue medications: albuterol 4 puffs every 4-6 hours as needed - Asthma control goals:  * Full participation in all desired activities (may need albuterol before activity) * Albuterol use two time or less a week on average (not counting use with activity) * Cough interfering with sleep two time or less a month * Oral steroids no more than once a year * No hospitalizations  2. Seasonal and perennial allergic rhinitis (grasses, weeds, trees, indoor molds, outdoor molds, dust mites, dog and cockroach) - The best ways to manage this are the antihistamines and nose sprays, but you are declining those today. - You can consider allergy shots (information provided).  - Allergy shots "re-train" and "reset" the immune system to ignore environmental allergens and decrease the resulting immune response to those allergens (sneezing, itchy watery eyes, runny nose, nasal congestion, etc).    - Allergy shots improve symptoms in 75-85% of patients.  - We can discuss more at the next appointment if the medications are not working for you.   3. GERD - With the history of the mucous production being worse at night, I think GERD (reflux) might be contributing to your symptoms. - Start omeprazole 40mg  once at night.   4. Return in about 6 weeks (around 08/03/2019). This can be an in-person follow up visit.   Please inform us of any Emergency Department visits, hospitalizations, or changes in symptoms. Call us before going to the ED for breathing or allergy  symptoms since we might be able to fit you in for a sick visit. Feel free to contact us anytime with any questions, problems, or concerns.  It was a pleasure to see you again today!  Websites that have reliable patient information: 1. American Academy of Asthma, Allergy, and Immunology: www.aaaai.org 2. Food Allergy Research and Education (FARE): foodallergy.org 3. Mothers of Asthmatics: http://www.asthmacommunitynetwork.org 4. American College of Allergy, Asthma, and Immunology: www.acaai.org  "Like" [place] on Facebook and Instagram for our latest updates!      Make sure you are registered to vote! If you have moved or changed any of your contact information, you will need to get this updated before voting!  In some cases, you MAY be able to register to vote online: [email or web address]    Voter ID laws are NOT going into effect for the General Election in November 2020! DO NOT let this stop you from exercising your right to vote!   Absentee voting is the SAFEST way to vote during the coronavirus pandemic!   Download and print an absentee ballot request form at [email or web address] or you can scan the QR code below with your smart phone:      More information on absentee ballots can be found here: [email or web address]  Allergy Shots   Allergies are the result of a chain reaction that starts in the immune system. Your immune system controls how your body defends itself. For instance, if you have an allergy to pollen, your immune system identifies pollen as an invader or allergen. Your  immune system overreacts by producing antibodies called Immunoglobulin E (IgE). These antibodies travel to cells that release chemicals, causing an allergic reaction.  The concept behind allergy immunotherapy, whether it is received in the form of shots or tablets, is that the immune system can be desensitized to specific allergens that trigger allergy  symptoms. Although it requires time and patience, the payback can be long-term relief.  How Do Allergy Shots Work?  Allergy shots work much like a vaccine. Your body responds to injected amounts of a particular allergen given in increasing doses, eventually developing a resistance and tolerance to it. Allergy shots can lead to decreased, minimal or no allergy symptoms.  There generally are two phases: build-up and maintenance. Build-up often ranges from three to six months and involves receiving injections with increasing amounts of the allergens. The shots are typically given once or twice a week, though more rapid build-up schedules are sometimes used.  The maintenance phase begins when the most effective dose is reached. This dose is different for each person, depending on how allergic you are and your response to the build-up injections. Once the maintenance dose is reached, there are longer periods between injections, typically two to four weeks.  Occasionally doctors give cortisone-type shots that can temporarily reduce allergy symptoms. These types of shots are different and should not be confused with allergy immunotherapy shots.  Who Can Be Treated with Allergy Shots?  Allergy shots may be a good treatment approach for people with allergic rhinitis (hay fever), allergic asthma, conjunctivitis (eye allergy) or stinging insect allergy.   Before deciding to begin allergy shots, you should consider:  . The length of allergy season and the severity of your symptoms . Whether medications and/or changes to your environment can control your symptoms . Your desire to avoid long-term medication use . Time: allergy immunotherapy requires a major time commitment . Cost: may vary depending on your insurance coverage  Allergy shots for children age 76 and older are effective and often well tolerated. They might prevent the onset of new allergen sensitivities or the progression to asthma.   Allergy shots are not started on patients who are pregnant but can be continued on patients who become pregnant while receiving them. In some patients with other medical conditions or who take certain common medications, allergy shots may be of risk. It is important to mention other medications you talk to your allergist.   When Will I Feel Better?  Some may experience decreased allergy symptoms during the build-up phase. For others, it may take as long as 12 months on the maintenance dose. If there is no improvement after a year of maintenance, your allergist will discuss other treatment options with you.  If you aren't responding to allergy shots, it may be because there is not enough dose of the allergen in your vaccine or there are missing allergens that were not identified during your allergy testing. Other reasons could be that there are high levels of the allergen in your environment or major exposure to non-allergic triggers like tobacco smoke.  What Is the Length of Treatment?  Once the maintenance dose is reached, allergy shots are generally continued for three to five years. The decision to stop should be discussed with your allergist at that time. Some people may experience a permanent reduction of allergy symptoms. Others may relapse and a longer course of allergy shots can be considered.  What Are the Possible Reactions?  The two types of adverse reactions that can  occur with allergy shots are local and systemic. Common local reactions include very mild redness and swelling at the injection site, which can happen immediately or several hours after. A systemic reaction, which is less common, affects the entire body or a particular body system. They are usually mild and typically respond quickly to medications. Signs include increased allergy symptoms such as sneezing, a stuffy nose or hives.  Rarely, a serious systemic reaction called anaphylaxis can develop. Symptoms include swelling  in the throat, wheezing, a feeling of tightness in the chest, nausea or dizziness. Most serious systemic reactions develop within 30 minutes of allergy shots. This is why it is strongly recommended you wait in your doctor's office for 30 minutes after your injections. Your allergist is trained to watch for reactions, and his or her staff is trained and equipped with the proper medications to identify and treat them.  Who Should Administer Allergy Shots?  The preferred location for receiving shots is your prescribing allergist's office. Injections can sometimes be given at another facility where the physician and staff are trained to recognize and treat reactions, and have received instructions by your prescribing allergist.

## 2019-06-22 NOTE — Progress Notes (Signed)
FOLLOW UP  Date of Service/Encounter:  06/22/19   Assessment:   Asthma-COPD overlap syndrome (grasses, weeds, trees, indoor molds, outdoor molds, dust mites, dog and cockroach) - very poorly controlled   Seasonal and perennial allergic rhinitis  Inability to tolerate antihistamines  Refusal to use nose sprays  Concern for aspirin allergy - history not entirely consistent with this   Jose Burch presents for follow-up visit.  We did put him on prednisone at the last visit.  We also started him on Trelegy to help with his asthma COPD overlap syndrome.  Unfortunately, he had facial swelling to the Trelegy.  He had also had similar swelling to Advair in the past.  I really am unsure how an inhaled steroid would cause this kind of reaction, but clearly he is not tolerating 1 of the medications and those 2 drugs.  It should be noted that he does use albuterol on a routine basis without any issues, so I do not think that the long-acting albuterol in either of those 2 medications is to blame.  Therefore, he is likely reacting, if he is reacting anything, to the inhaled steroid.  I think we need to definitely have him on some type of pulmonary medication given the severity of his disease.  We did have a Spiriva sample so we did teach him how to use this and he is going to try it for a month before he request a prescription for it.  Another option in the future might be Stiolto, which contains a long-acting beta agonist combined with a long-acting muscarinic antagonist.  Thankfully, his spirometry was in the 55 to 60% range today, which is better than when I saw him 2 weeks ago.  From a rhinitis standpoint, his main complaint today is mucus production.  However, he declines to use any nose sprays or antihistamines, which are the mainstay's of managing mucus production.  I did consider the fact that this could be uncontrolled reflux which is leading to mucus production and worsening lung function, so I  did finally get him to agreed to try omeprazole today.  Without any nose sprays or antihistamines, there is not much we can use to help with his rhinitis.  We could always add on montelukast in the future.  We did discuss starting allergy shots with him as well, but he would have to travel from [place].  He does have a primary care provider in [place], but if he obtains his shots at his primary care provider's office, he might have to pay an out of network co-pay at each visit.  He is going to look into this further.  I think this would all depend on how his PCP charge the shot visits.  He does have questions about an aspirin allergy today as well.  He does not have a consistent reaction to it and at times will tolerate it without a problem.  He does not get hives often at all, maybe once or twice.  He does report itching with the aspirin.  He also reports diarrhea.  This is a bizarre constellation of symptoms not consistent with the typical aspirin allergy.  He does not have any throat swelling or breathing difficulties with aspirin.  He has been on aspirin for 6 months.  With the set of symptoms, I do not think aspirin is necessarily contraindicated.  If he wanted to definitively answer this we could bring him in for an aspirin challenge where we would use a  higher dose aspirin and give him graded doses over the course of a couple of hours.  Plan/Recommendations:   1. Asthma-COPD overlap syndrom - Lung testing was in the 55-60% range today. - We are going to start Spiriva one puff once daily (keeps your lungs open for 24 hours at a time). - Sample provided.  - We are going to put you on a steroid burst to see if this can help to improve the lung function.  - Daily controller medication(s): Spiriva 2.5 mcg two puffs once daily - Prior to physical activity: albuterol 2 puffs 10-15 minutes before physical activity. - Rescue medications: albuterol 4 puffs every 4-6 hours as needed -  Asthma control goals:  * Full participation in all desired activities (may need albuterol before activity) * Albuterol use two time or less a week on average (not counting use with activity) * Cough interfering with sleep two time or less a month * Oral steroids no more than once a year * No hospitalizations  2. Seasonal and perennial allergic rhinitis (grasses, weeds, trees, indoor molds, outdoor molds, dust mites, dog and cockroach) - The best ways to manage this are the antihistamines and nose sprays, but you are declining those today. - You can consider allergy shots (information provided).  - Allergy shots "re-train" and "reset" the immune system to ignore environmental allergens and decrease the resulting immune response to those allergens (sneezing, itchy watery eyes, runny nose, nasal congestion, etc).    - Allergy shots improve symptoms in 75-85% of patients.  - We can discuss more at the next appointment if the medications are not working for you.   3. GERD - With the history of the mucous production being worse at night, I think GERD (reflux) might be contributing to your symptoms. - Start omeprazole 40mg  once at night.   4. Return in about 6 weeks (around 08/03/2019). This can be an in-person follow up visit.   Subjective:   Jose Burch is a 68 y.o. male presenting today for follow up of  Chief Complaint  Patient presents with  . Allergic Reaction    Trelegy     Jose Burch has a history of the following: Patient Active Problem List   Diagnosis Date Noted  . Hypocalcemia 08/15/2017  . Malignant neoplasm of thyroid gland ([place]) 01/13/2017  . Postsurgical hypothyroidism 01/13/2017    History obtained from: chart review and patient.  Jose Burch is a 68 y.o. male presenting for a follow up visit.  He was last seen 2 weeks ago.  At that time, his lung testing was in the 40% range, but it did improve with the albuterol.  We did tentatively diagnosed him with COPD.  I  did recommend that he restart a daily controller medication so as not to lose more lung function.  We changed him from Advair to Trelegy because he had had an adverse reaction to Advair.  We did put him on a steroid burst to see if this improves his lung function.  We did testing that was positive to grasses, weeds, trees, indoor and outdoor molds, dust mite, dog, and cockroach.  We started him on Zyrtec and Nasacort.  We also discussed reflux as a contribution to his symptoms and we discussed adding omeprazole at this visit if he had not improved.  Since the last visit, he has not done well. He took the Trelegy as recommended but he developed swelling of his neck and face. This was after the first  dose. He stopped this immediately. He denies any swelling of his throat and difficulty breathing.  Asthma/Respiratory Symptom History: He did not feel any better with the use of the Trelegy. He has been using the albuterol with relief of his symptoms. He does not have any adverse effects to the albuterol.  Allergic Rhinitis Symptom History: He reports that the cetirizine made him more jittery. He also reports taht he does not do nasal sprays. He refuses to try any nose sprays. He denies sinus pain and fevers today.   He also has a question about aspirin. He is supposed to take a baby aspirin every morning and he has bene on it for six months. He reports that he has itching to it. He also reports that he has diarrhea from it. He does occasionally get hives from this, but this is not a daily thing.   Otherwise, there have been no changes to his past medical history, surgical history, family history, or social history.    Review of Systems  Constitutional: Negative.  Negative for fever, malaise/fatigue and weight loss.  HENT: Positive for congestion. Negative for ear discharge, ear pain, nosebleeds and sinus pain.        Positive for marked mucous production.  Eyes: Negative for pain, discharge and redness.   Respiratory: Positive for cough and shortness of breath. Negative for sputum production and wheezing.   Cardiovascular: Negative.  Negative for chest pain and palpitations.  Gastrointestinal: Negative for abdominal pain, heartburn, nausea and vomiting.  Skin: Negative.  Negative for itching and rash.  Neurological: Negative for dizziness and headaches.  Endo/Heme/Allergies: Negative for environmental allergies. Does not bruise/bleed easily.       Objective:   Blood pressure 140/62, pulse 87, temperature 98.6 F (37 C), temperature source Temporal, resp. rate (!) 22, SpO2 95 %. There is no height or weight on file to calculate BMI.   Physical Exam:  Physical Exam  Constitutional: He appears well-developed.  Talkative.  Cooperative with the exam.  HENT:  Head: Normocephalic and atraumatic.  Right Ear: Tympanic membrane, external ear and ear canal normal.  Left Ear: Tympanic membrane, external ear and ear canal normal.  Nose: Mucosal edema and rhinorrhea present. No nasal deformity or septal deviation. No epistaxis. Right sinus exhibits no maxillary sinus tenderness and no frontal sinus tenderness. Left sinus exhibits no maxillary sinus tenderness and no frontal sinus tenderness.  Mouth/Throat: Uvula is midline and oropharynx is clear and moist. Mucous membranes are not pale and not dry.  Minimal cobblestoning in the posterior oropharynx.  Eyes: Pupils are equal, round, and reactive to light. Conjunctivae and EOM are normal. Right eye exhibits no chemosis and no discharge. Left eye exhibits no chemosis and no discharge. Right conjunctiva is not injected. Left conjunctiva is not injected.  Cardiovascular: Normal rate, regular rhythm and normal heart sounds.  Respiratory: Effort normal and breath sounds normal. No accessory muscle usage. No tachypnea. No respiratory distress. He has no wheezes. He has no rhonchi. He has no rales. He exhibits no tenderness.  Somewhat decreased air  movement at the bases, but no increased work of breathing.  No crackles or wheezes.  Lymphadenopathy:    He has no cervical adenopathy.  Neurological: He is alert.  Skin: No abrasion, no petechiae and no rash noted. Rash is not papular, not vesicular and not urticarial. No erythema. No pallor.  No urticarial or eczematous lesions noted.  Psychiatric: He has a normal mood and affect.     Diagnostic  studies:    Spirometry: results abnormal (FEV1: 1.61/54%, FVC: 2.58/58%, FEV1/FVC: 62%).    Spirometry consistent with mixed obstructive and restrictive disease.  Overall, values are somewhat better compared to the last time we saw him.   Allergy Studies: none       Salvatore Marvel, MD  [facility]

## 2019-06-25 ENCOUNTER — Encounter (HOSPITAL_COMMUNITY): Payer: Self-pay | Admitting: Psychiatry

## 2019-06-25 ENCOUNTER — Ambulatory Visit (INDEPENDENT_AMBULATORY_CARE_PROVIDER_SITE_OTHER): Payer: PPO | Admitting: Psychiatry

## 2019-06-25 ENCOUNTER — Other Ambulatory Visit: Payer: Self-pay

## 2019-06-25 DIAGNOSIS — G4700 Insomnia, unspecified: Secondary | ICD-10-CM

## 2019-06-25 DIAGNOSIS — F331 Major depressive disorder, recurrent, moderate: Secondary | ICD-10-CM | POA: Diagnosis not present

## 2019-06-25 MED ORDER — TRAZODONE HCL 50 MG PO TABS: ORAL_TABLET | ORAL | 1 refills | Status: DC

## 2019-06-25 MED ORDER — MIRTAZAPINE 30 MG PO TABS: 30.0000 mg | ORAL_TABLET | Freq: Every day | ORAL | 1 refills | Status: DC

## 2019-06-25 NOTE — Patient Instructions (Signed)
1. Increase mirtazapine 30 mg at night 2. Continue lorazepam 2 mg daily as needed for anxiety- prescribed by PCP 3. Start Trazodone 25-50 mg at night as needed for sleep  4. Next appointment: 10/22 at 2 PM

## 2019-06-27 ENCOUNTER — Telehealth: Payer: Self-pay | Admitting: Allergy & Immunology

## 2019-06-27 MED ORDER — OMEPRAZOLE 40 MG PO CPDR: 40.0000 mg | DELAYED_RELEASE_CAPSULE | Freq: Every day | ORAL | 5 refills | Status: DC

## 2019-06-27 NOTE — Telephone Encounter (Signed)
Prescription has been sent in. Called patient and informed that the medication has been sent in. Patient verbalized understanding.

## 2019-06-27 NOTE — Progress Notes (Signed)
That is what I figured. We already provided the information on the vials, so we will see if he makes a decision.  Salvatore Marvel, MD [facility]

## 2019-06-27 NOTE — Telephone Encounter (Signed)
Patient was seen 06-22-19 and he states that [facility] 40 [facility] [address] [facility] [place]. He said it is not there and would like it sent in.

## 2019-07-02 ENCOUNTER — Telehealth: Payer: Self-pay

## 2019-07-02 NOTE — Telephone Encounter (Signed)
Patient called and was wondering if he starts allergy injections, would he be able to do them every two weeks because that is what he can afford. I advised normally no, but I will send a message back to the Provider.   Thanks

## 2019-07-02 NOTE — Telephone Encounter (Signed)
I guess he could, but it would take longer to reach maintenance. That is fine, however.   Salvatore Marvel, MD [facility]

## 2019-07-03 NOTE — Telephone Encounter (Signed)
Call to patient.  Information given.  Pt would like to schedule an appointment to start allergy injections.  Explained to patient that it would take longer for him to reach maintenance, pt was okay with this.  Pt states that with his income this is the only way he can afford to do the immunotherapy. 1st appt scheduled on 07/18/2019.

## 2019-07-03 NOTE — Telephone Encounter (Signed)
Please give the patient a call with this information.  Thanks

## 2019-07-04 ENCOUNTER — Encounter: Payer: Self-pay | Admitting: Allergy & Immunology

## 2019-07-04 DIAGNOSIS — J3081 Allergic rhinitis due to animal (cat) (dog) hair and dander: Secondary | ICD-10-CM | POA: Diagnosis not present

## 2019-07-04 NOTE — Addendum Note (Signed)
Addended by: Valentina Shaggy on: 07/04/2019 09:24 AM   Modules accepted: Orders

## 2019-07-04 NOTE — Progress Notes (Signed)
VIALS EXP 07-03-20 

## 2019-07-05 DIAGNOSIS — Z713 Dietary counseling and surveillance: Secondary | ICD-10-CM | POA: Diagnosis not present

## 2019-07-05 DIAGNOSIS — G4700 Insomnia, unspecified: Secondary | ICD-10-CM | POA: Diagnosis not present

## 2019-07-05 DIAGNOSIS — I10 Essential (primary) hypertension: Secondary | ICD-10-CM | POA: Diagnosis not present

## 2019-07-05 DIAGNOSIS — J3089 Other allergic rhinitis: Secondary | ICD-10-CM

## 2019-07-05 DIAGNOSIS — F419 Anxiety disorder, unspecified: Secondary | ICD-10-CM | POA: Diagnosis not present

## 2019-07-05 DIAGNOSIS — J309 Allergic rhinitis, unspecified: Secondary | ICD-10-CM | POA: Diagnosis not present

## 2019-07-05 DIAGNOSIS — Z6832 Body mass index (BMI) 32.0-32.9, adult: Secondary | ICD-10-CM | POA: Diagnosis not present

## 2019-07-05 NOTE — Telephone Encounter (Signed)
Script written and routed to the IT Team.  Salvatore Marvel, MD [facility]

## 2019-07-18 ENCOUNTER — Ambulatory Visit (INDEPENDENT_AMBULATORY_CARE_PROVIDER_SITE_OTHER): Payer: PPO

## 2019-07-18 DIAGNOSIS — J309 Allergic rhinitis, unspecified: Secondary | ICD-10-CM

## 2019-07-18 MED ORDER — EPINEPHRINE 0.3 MG/0.3ML IJ SOAJ: 0.3000 mg | Freq: Once | INTRAMUSCULAR | 2 refills | Status: AC

## 2019-07-26 NOTE — Progress Notes (Signed)
Virtual Visit via Video Note  I connected with Jose Burch on 08/02/19 at  2:00 PM EDT by a video enabled telemedicine application and verified that I am speaking with the correct person using two identifiers.   I discussed the limitations of evaluation and management by telemedicine and the availability of in person appointments. The patient expressed understanding and agreed to proceed.     I discussed the assessment and treatment plan with the patient. The patient was provided an opportunity to ask questions and all were answered. The patient agreed with the plan and demonstrated an understanding of the instructions.   The patient was advised to call back or seek an in-person evaluation if the symptoms worsen or if the condition fails to improve as anticipated.  I provided 15 minutes of non-face-to-face time during this encounter.   [name], MD    [facility] MD/PA/NP OP Progress Note  08/02/2019 2:26 PM Jose Burch  MRN:  [id number]  Chief Complaint:  Chief Complaint    Depression; Follow-up     HPI:  This is a follow-up appointment for depression.  He states that he has been sleeping 4 to 5 hours, which has been good for him. He has started injection for allergy. Although he was told that he would need to continue this shot for another three years, he is hoping for the best. He restarted to mown yard and "fight allergy." Although he feels down and depressed as he is unable to do as he used to, he "face it" as he "don't want to dwell on it." He enjoyed family gathering, celebrating birthday of his wife and anniversary. He "perked up" meeting with his great grand baby, who is now two months old. He has fair energy and motivation. He has fair concentration. He has good appetite. He denies SI. He feels anxious, tense at times. He goes outside when he feels anxious. He had a few panic attacks when he was scared of his allergy.   Visit Diagnosis:    ICD-10-CM   1. Insomnia,  unspecified type  G47.00   2. MDD (major depressive disorder), recurrent episode, mild ([place])  F33.0     Past Psychiatric History: Please see initial evaluation for full details. I have reviewed the history. No updates at this time.     Past Medical History:  Past Medical History:  Diagnosis Date  . Asthma   . Cancer ([place])    Thyroid  . COPD (chronic obstructive pulmonary disease) ([place])   . Hypertension   . Insomnia   . Thyroid disease   . Tobacco abuse     Past Surgical History:  Procedure Laterality Date  . skin cancer removed    . THYROIDECTOMY  2015    Family Psychiatric History: Please see initial evaluation for full details. I have reviewed the history. No updates at this time.     Family History:  Family History  Problem Relation Age of Onset  . Cancer Mother   . Asthma Mother   . Heart attack Sister   . Allergic rhinitis Neg Hx   . Eczema Neg Hx     Social History:  Social History   Socioeconomic History  . Marital status: Married    Spouse name: Not on file  . Number of children: Not on file  . Years of education: Not on file  . Highest education level: Not on file  Occupational History  . Not on file  Social Needs  . Financial  resource strain: Not on file  . Food insecurity    Worry: Not on file    Inability: Not on file  . Transportation needs    Medical: Not on file    Non-medical: Not on file  Tobacco Use  . Smoking status: Current Every Day Smoker    Packs/day: 0.50    Types: Cigarettes  . Smokeless tobacco: Never Used  Substance and Sexual Activity  . Alcohol use: No  . Drug use: No  . Sexual activity: Never    Partners: Female  Lifestyle  . Physical activity    Days per week: Not on file    Minutes per session: Not on file  . Stress: Not on file  Relationships  . Social Herbalist on phone: Not on file    Gets together: Not on file    Attends religious service: Not on file    Active member of club or organization:  Not on file    Attends meetings of clubs or organizations: Not on file    Relationship status: Not on file  Other Topics Concern  . Not on file  Social History Narrative  . Not on file    Allergies:  Allergies  Allergen Reactions  . Aspirin Hives    Metabolic Disorder Labs: No results found for: HGBA1C, MPG No results found for: PROLACTIN No results found for: CHOL, TRIG, HDL, CHOLHDL, VLDL, LDLCALC Lab Results  Component Value Date   TSH 0.158 (L) 05/05/2019   TSH 1.02 03/26/2019    Therapeutic Level Labs: No results found for: LITHIUM No results found for: VALPROATE No components found for:  CBMZ  Current Medications: Current Outpatient Medications  Medication Sig Dispense Refill  . albuterol (VENTOLIN HFA) 108 (90 Base) MCG/ACT inhaler Inhale 4 puffs into the lungs every 4 (four) hours as needed. 18 g 1  . amLODipine (NORVASC) 10 MG tablet Take 10 mg by mouth daily.    . calcitRIOL (ROCALTROL) 0.5 MCG capsule TAKE 1 CAPSULE (0.5 MCG TOTAL) BY MOUTH DAILY. 90 capsule 0  . calcium carbonate (TUMS EX) 750 MG chewable tablet Chew 1 tablet (750 mg total) by mouth 3 (three) times daily with meals. 180 tablet 3  . levothyroxine (SYNTHROID) 150 MCG tablet Take 1 tablet (150 mcg total) by mouth daily before breakfast. 30 tablet 6  . LORazepam (ATIVAN) 2 MG tablet Take 1 tablet by mouth at bedtime.  0  . losartan (COZAAR) 25 MG tablet TAKE 2 TABLETS BY MOUTH EVERY DAY (Patient taking differently: 25 mg. ) 14 tablet 0  . mirtazapine (REMERON) 30 MG tablet Take 1 tablet (30 mg total) by mouth at bedtime. 90 tablet 1  . omeprazole (PRILOSEC) 40 MG capsule Take 1 capsule (40 mg total) by mouth at bedtime. 30 capsule 5  . traZODone (DESYREL) 50 MG tablet 25-50 mg at night as needed for sleep 90 tablet 1   No current facility-administered medications for this visit.      Musculoskeletal: Strength & Muscle Tone: N/A Gait & Station: N/A Patient leans: N/A  Psychiatric Specialty  Exam: Review of Systems  Psychiatric/Behavioral: Positive for depression. Negative for hallucinations, memory loss, substance abuse and suicidal ideas. The patient is nervous/anxious. The patient does not have insomnia.   All other systems reviewed and are negative.   There were no vitals taken for this visit.There is no height or weight on file to calculate BMI.  General Appearance: Fairly Groomed  Eye Contact:  Good  Speech:  Clear and Coherent  Volume:  Normal  Mood:  "better"  Affect:  Appropriate, Congruent and calmer, more reactive  Thought Process:  Coherent  Orientation:  Full (Time, Place, and Person)  Thought Content: Logical   Suicidal Thoughts:  No  Homicidal Thoughts:  No  Memory:  Immediate;   Good  Judgement:  Good  Insight:  Good  Psychomotor Activity:  Normal  Concentration:  Concentration: Good and Attention Span: Good  Recall:  Good  Fund of Knowledge: Good  Language: Good  Akathisia:  No  Handed:  Right  AIMS (if indicated): not done  Assets:  Communication Skills Desire for Improvement  ADL's:  Intact  Cognition: WNL  Sleep:  Fair   Screenings: PHQ2-9     Office Visit from 02/13/2018 in [facility] Office Visit from 08/15/2017 in [facility] Office Visit from 02/10/2017 in [facility] Office Visit from 01/13/2017 in [facility]  PHQ-2 Total Score  0  0  0  0       Assessment and Plan:  Jose Burch is a 68 y.o. year old male with a history of depression, insomnia,malignant neoplasm of thyroid gland s/pthyroidectomyin2015. ablation , postsurgical hypothyroidism,hypocalcemia,COPD  , who presents for follow up appointment for Insomnia, unspecified type  MDD (major depressive disorder), recurrent episode, mild ([facility])  # MDD, mild, recurrent without psychotic features There has been steady improvement in depressive symptoms since up titration of mirtazapine.   Psychosocial stressors includes taking care of his wife, retirement in 2016, and demoralization from medical condition.  Will continue mirtazapine to target depression, anxiety and insomnia.  Discussed potential metabolic side effect.  We will continue trazodone as needed for insomnia.  Although he will greatly benefit from CBT, he would like to hold this option at this time.  Discussed behavioral activation.   # Insomnia R/o sleep apnea Improvement in insomnia after starting the combination of mirtazapine and trazodone.  That although he does have snoring and daytime fatigue, he is not interested in pursuing sleep study at this time.  We will continue to discuss as needed.    Plan I have reviewed and updated plans as below 1.Continue mirtazapine30 mg at night 2. Continue lorazepam 2 mg daily as needed for anxiety- prescribed by PCP 3. Continue Trazodone 25-50 mg at night as needed for sleep  4. Next appointment: in January - he would like to hold off therapy  Past trials of medication:sinequan(doxepin), fluoxetine, bupropion, Ambien, Trazodone,  The patient demonstrates the following risk factors for suicide: Chronic risk factors for suicide include:psychiatric disorder ofdepressionand medical illness of COPD,thyroidcancer. Acute risk factorsfor suicide include: unemployment and loss (financial, interpersonal, professional). Protective factorsfor this patient include: positive social support, coping skills and hope for the future. Considering these factors, the overall suicide risk at this point appears to below. Patientisappropriate for outpatient follow up.  [name], MD 08/02/2019, 2:26 PM

## 2019-08-02 ENCOUNTER — Ambulatory Visit (INDEPENDENT_AMBULATORY_CARE_PROVIDER_SITE_OTHER): Payer: PPO | Admitting: Psychiatry

## 2019-08-02 ENCOUNTER — Encounter (HOSPITAL_COMMUNITY): Payer: Self-pay | Admitting: Psychiatry

## 2019-08-02 ENCOUNTER — Other Ambulatory Visit: Payer: Self-pay

## 2019-08-02 DIAGNOSIS — F330 Major depressive disorder, recurrent, mild: Secondary | ICD-10-CM

## 2019-08-02 DIAGNOSIS — G4700 Insomnia, unspecified: Secondary | ICD-10-CM | POA: Diagnosis not present

## 2019-08-02 MED ORDER — MIRTAZAPINE 30 MG PO TABS: 30.0000 mg | ORAL_TABLET | Freq: Every day | ORAL | 1 refills | Status: DC

## 2019-08-02 MED ORDER — TRAZODONE HCL 50 MG PO TABS: ORAL_TABLET | ORAL | 1 refills | Status: DC

## 2019-08-02 NOTE — Patient Instructions (Signed)
1.Continue mirtazapine30 mg at night 2. Continue lorazepam 2 mg daily as needed for anxiety 3. Continue Trazodone 25-50 mg at night as needed for sleep  4. Next appointment: in January

## 2019-08-03 ENCOUNTER — Ambulatory Visit (INDEPENDENT_AMBULATORY_CARE_PROVIDER_SITE_OTHER): Payer: PPO

## 2019-08-03 ENCOUNTER — Ambulatory Visit: Payer: PPO | Admitting: Allergy & Immunology

## 2019-08-03 DIAGNOSIS — J309 Allergic rhinitis, unspecified: Secondary | ICD-10-CM | POA: Diagnosis not present

## 2019-08-07 ENCOUNTER — Telehealth: Payer: Self-pay

## 2019-08-07 ENCOUNTER — Other Ambulatory Visit: Payer: Self-pay

## 2019-08-07 MED ORDER — PREDNISONE 10 MG PO TABS: ORAL_TABLET | ORAL | 0 refills | Status: DC

## 2019-08-07 NOTE — Telephone Encounter (Signed)
Patient called to report that he started having hives on his left arm on Saturday that have progressively gotten worse. He reports that they are covering his entire arm. He received his allergy injections on Friday. He received injections in both arms but is only experiencing the rash in his left arm. He received his mold and dust mite injection in this arm. He received G-W-T-Dog injection in his right arm. He plans to take Benadryl now, but had not taken anything for relief prior to calling our office. Please advise.

## 2019-08-07 NOTE — Telephone Encounter (Signed)
Patient made aware of recommendations per Dr Neldon Mc. Patient advised that [facility] [address] [facility] [place]. Patient advised to call our office to follow up with Dr. Ernst Bowler if symptoms persist.

## 2019-08-07 NOTE — Telephone Encounter (Signed)
Please inform patient that we can give him prednisone 1 time per day for 5 days only and he can take an antihistamine on a daily basis such as Zyrtec 10 mg daily.  He can follow-up with Dr. Ernst Bowler for further evaluation and treatment.

## 2019-08-17 ENCOUNTER — Ambulatory Visit (INDEPENDENT_AMBULATORY_CARE_PROVIDER_SITE_OTHER): Payer: PPO

## 2019-08-17 DIAGNOSIS — J309 Allergic rhinitis, unspecified: Secondary | ICD-10-CM

## 2019-08-17 NOTE — Telephone Encounter (Signed)
Agree with the plan. Ok to stop Spiriva.   Salvatore Marvel, MD [facility] and [facility]

## 2019-08-17 NOTE — Telephone Encounter (Signed)
Noted  

## 2019-08-17 NOTE — Telephone Encounter (Addendum)
Patient came in today to get his allergy injections and informed me of the rash he had after his last injection. Patient stated that he doesn't want to continue to get his allergy injections if he is going to have a rash every time. He also informed me that his Spiriva Respimat was causing him problems and he wasn't sure if his rash was due to the inhaler. He stated that it causes his neck to swell. I informed patient that we wouldn't know if it was the injection unless he gets another injection. Patient has stopped taking the Spiriva due to his reactions to it. Patient agreed to get his allergy injection today to see if it causes him the same rash. Dose was repeated.

## 2019-08-20 ENCOUNTER — Other Ambulatory Visit: Payer: Self-pay | Admitting: Cardiovascular Disease

## 2019-08-20 MED ORDER — LOSARTAN POTASSIUM 50 MG PO TABS: 50.0000 mg | ORAL_TABLET | Freq: Every day | ORAL | 0 refills | Status: DC

## 2019-08-20 NOTE — Telephone Encounter (Signed)
Done. Patient notified.

## 2019-08-20 NOTE — Telephone Encounter (Signed)
° ° ° °  1. Which medications need to be refilled? (please list name of each medication and dose if known)  losartan (COZAAR) 25 MG tablet    2. Which pharmacy/location (including street and city if local pharmacy) is medication to be sent to?      [facility] [place]   3. Do they need a 30 day or 90 day supply?   Patient was only given 14 pills.has upcoming appt. States that prescription is telling him to take 2 pills daily.

## 2019-08-21 ENCOUNTER — Other Ambulatory Visit: Payer: Self-pay | Admitting: Cardiovascular Disease

## 2019-08-27 DIAGNOSIS — L570 Actinic keratosis: Secondary | ICD-10-CM | POA: Diagnosis not present

## 2019-08-27 DIAGNOSIS — D0461 Carcinoma in situ of skin of right upper limb, including shoulder: Secondary | ICD-10-CM | POA: Diagnosis not present

## 2019-08-27 DIAGNOSIS — Z08 Encounter for follow-up examination after completed treatment for malignant neoplasm: Secondary | ICD-10-CM | POA: Diagnosis not present

## 2019-08-27 DIAGNOSIS — X32XXXD Exposure to sunlight, subsequent encounter: Secondary | ICD-10-CM | POA: Diagnosis not present

## 2019-08-27 DIAGNOSIS — Z85828 Personal history of other malignant neoplasm of skin: Secondary | ICD-10-CM | POA: Diagnosis not present

## 2019-08-31 ENCOUNTER — Telehealth: Payer: Self-pay | Admitting: Allergy & Immunology

## 2019-08-31 ENCOUNTER — Ambulatory Visit (INDEPENDENT_AMBULATORY_CARE_PROVIDER_SITE_OTHER): Payer: PPO

## 2019-08-31 DIAGNOSIS — J309 Allergic rhinitis, unspecified: Secondary | ICD-10-CM | POA: Diagnosis not present

## 2019-08-31 MED ORDER — COMBIVENT RESPIMAT 20-100 MCG/ACT IN AERS: 1.0000 | INHALATION_SPRAY | Freq: Two times a day (BID) | RESPIRATORY_TRACT | 5 refills | Status: DC

## 2019-08-31 NOTE — Telephone Encounter (Signed)
Patient came in.  He was last placed on Spiriva 2.5 mcg 1 puff once daily.  However, he reports today that he has been having neck swelling and throat swelling from it.  This is been the similar reaction to all of the inhaled medications we have had him on.  However, the Spiriva was providing relief.  Review of his notes show that he has been having relief from albuterol.  Given this, we will start him on Combivent 1 puff twice daily.  He has previously failed inhaled steroids, inhaled steroids combined with long-acting bronchodilators, and Spiriva.  All of his reactions result in very subjective throat swelling and facial swelling.  I have never appreciated any of this when I see him in clinic.  However, given his spirometry which is typically in the 55 to 60% range, I think he needs to be on something daily to control his symptoms.  We could consider the use of a biologic in the future if needed.  There are some home injectors that could be very useful in this situation.  Salvatore Marvel, MD [facility]

## 2019-09-03 ENCOUNTER — Ambulatory Visit: Payer: PPO | Admitting: Cardiovascular Disease

## 2019-09-03 ENCOUNTER — Other Ambulatory Visit: Payer: Self-pay

## 2019-09-03 ENCOUNTER — Encounter: Payer: Self-pay | Admitting: Cardiovascular Disease

## 2019-09-03 VITALS — BP 150/78 | HR 90 | Ht 70.5 in | Wt 223.0 lb

## 2019-09-03 DIAGNOSIS — I10 Essential (primary) hypertension: Secondary | ICD-10-CM | POA: Diagnosis not present

## 2019-09-03 DIAGNOSIS — Z720 Tobacco use: Secondary | ICD-10-CM | POA: Diagnosis not present

## 2019-09-03 DIAGNOSIS — R600 Localized edema: Secondary | ICD-10-CM | POA: Diagnosis not present

## 2019-09-03 DIAGNOSIS — J449 Chronic obstructive pulmonary disease, unspecified: Secondary | ICD-10-CM

## 2019-09-03 DIAGNOSIS — R0789 Other chest pain: Secondary | ICD-10-CM

## 2019-09-03 MED ORDER — CHLORTHALIDONE 25 MG PO TABS: 25.0000 mg | ORAL_TABLET | Freq: Every day | ORAL | 3 refills | Status: DC

## 2019-09-03 MED ORDER — POTASSIUM CHLORIDE CRYS ER 20 MEQ PO TBCR: 20.0000 meq | EXTENDED_RELEASE_TABLET | Freq: Every day | ORAL | 3 refills | Status: AC

## 2019-09-03 MED ORDER — LOSARTAN POTASSIUM 100 MG PO TABS: 100.0000 mg | ORAL_TABLET | Freq: Every day | ORAL | 3 refills | Status: AC

## 2019-09-03 NOTE — Progress Notes (Signed)
SUBJECTIVE: The patient presents for routine follow-up.  He has a history of mild to moderate COPD.  Echocardiogram in 06/2016 showed normal left ventricular systolic function, LVEF [id number], normal regional wall motion, moderate LVH, grade 1 diastolic dysfunction, mild mitral regurgitation.  Nuclear stress test in 06/2016 showed no evidence of myocardial ischemia or scar and was deemed a low risk study.  He has multiple somatic complaints.  He denies chest pain.  He complains of bilateral leg and chest swelling with amlodipine.  He said his legs, chest and neck feel tight after he takes amlodipine.  He sometimes has coughing spells and has occasionally passed out after one of them.  He has been dealing with anxiety and depression due to the pandemic.  He takes lorazepam and said it helps.   Review of Systems: As per "subjective", otherwise negative.  Allergies  Allergen Reactions  . Aspirin Hives    Current Outpatient Medications  Medication Sig Dispense Refill  . amLODipine (NORVASC) 10 MG tablet Take 10 mg by mouth daily.    . calcitRIOL (ROCALTROL) 0.5 MCG capsule TAKE 1 CAPSULE (0.5 MCG TOTAL) BY MOUTH DAILY. 90 capsule 0  . calcium carbonate (TUMS EX) 750 MG chewable tablet Chew 1 tablet (750 mg total) by mouth 3 (three) times daily with meals. 180 tablet 3  . levothyroxine (SYNTHROID) 150 MCG tablet Take 1 tablet (150 mcg total) by mouth daily before breakfast. 30 tablet 6  . losartan (COZAAR) 50 MG tablet Take 1 tablet (50 mg total) by mouth daily. 30 tablet 0  . mirtazapine (REMERON) 30 MG tablet Take 1 tablet (30 mg total) by mouth at bedtime. 90 tablet 1  . omeprazole (PRILOSEC) 40 MG capsule Take 1 capsule (40 mg total) by mouth at bedtime. 30 capsule 5  . traZODone (DESYREL) 50 MG tablet 25-50 mg at night as needed for sleep 90 tablet 1  . UNKNOWN TO PATIENT Allergy injection every 2 weeks     No current facility-administered medications for this visit.     Past  Medical History:  Diagnosis Date  . Asthma   . Cancer ([place])    Thyroid  . COPD (chronic obstructive pulmonary disease) ([place])   . Hypertension   . Insomnia   . Thyroid disease   . Tobacco abuse     Past Surgical History:  Procedure Laterality Date  . skin cancer removed    . THYROIDECTOMY  2015    Social History   Socioeconomic History  . Marital status: Married    Spouse name: Not on file  . Number of children: Not on file  . Years of education: Not on file  . Highest education level: Not on file  Occupational History  . Not on file  Social Needs  . Financial resource strain: Not on file  . Food insecurity    Worry: Not on file    Inability: Not on file  . Transportation needs    Medical: Not on file    Non-medical: Not on file  Tobacco Use  . Smoking status: Current Every Day Smoker    Packs/day: 0.50    Types: Cigarettes  . Smokeless tobacco: Never Used  Substance and Sexual Activity  . Alcohol use: No  . Drug use: No  . Sexual activity: Never    Partners: Female  Lifestyle  . Physical activity    Days per week: Not on file    Minutes per session: Not on file  .  Stress: Not on file  Relationships  . Social Herbalist on phone: Not on file    Gets together: Not on file    Attends religious service: Not on file    Active member of club or organization: Not on file    Attends meetings of clubs or organizations: Not on file    Relationship status: Not on file  . Intimate partner violence    Fear of current or ex partner: Not on file    Emotionally abused: Not on file    Physically abused: Not on file    Forced sexual activity: Not on file  Other Topics Concern  . Not on file  Social History Narrative  . Not on file     Vitals:   09/03/19 1549  BP: (!) 150/78  Pulse: 90  SpO2: 96%  Weight: 223 lb (101.2 kg)  Height: 5' 10.5" (1.791 m)    Wt Readings from Last 3 Encounters:  09/03/19 223 lb (101.2 kg)  06/08/19 225 lb (102.1 kg)   05/08/19 225 lb (102.1 kg)     PHYSICAL EXAM General: NAD HEENT: Normal. Neck: No JVD, no thyromegaly. Lungs: Clear to auscultation bilaterally with normal respiratory effort. CV: Regular rate and rhythm, normal S1/S2, no S3/S4, no murmur. No pretibial or periankle edema.  No carotid bruit.   Abdomen: Soft, nontender, no distention.  Neurologic: Alert and oriented.  Psych: Normal affect. Skin: Normal. Musculoskeletal: No gross deformities.      Labs: Lab Results  Component Value Date/Time   K 3.6 05/08/2019 10:30 AM   BUN 11 05/08/2019 10:30 AM   CREATININE 0.98 05/08/2019 10:30 AM   CREATININE 1.10 03/26/2019 10:54 AM   ALT 18 05/08/2019 10:30 AM   TSH 0.158 (L) 05/05/2019 11:50 AM   TSH 1.02 03/26/2019 10:54 AM   HGB 13.6 05/08/2019 10:30 AM     Lipids: No results found for: LDLCALC, LDLDIRECT, CHOL, TRIG, HDL     ASSESSMENT AND PLAN: 1. Chest painand tightness: No symptom recurrence and no nitroglycerin use in the past year. Continue ASA 81 mg and SL nitro prn. No ischemia or scar on nuclear stress testing. Normal LV systolic function. No furthercardiactesting indicated.  2.  COPD: Mild to moderate in severity.  3.  Hypertension: Blood pressure is elevated.  Given diffuse swelling with amlodipine, I will discontinue this and increase losartan to 100 mg daily.  I will add chlorthalidone 25 mg daily along with supplemental potassium chloride 20 mEq daily.  Further blood pressure management can be accomplished by his PCP.  4.  Bilateral leg edema: This appears to be due to amlodipine.  See discussion in #3.   Disposition: Follow up as needed   Kate Sable, M.D., F.A.C.C.

## 2019-09-03 NOTE — Addendum Note (Signed)
Addended by: Laurine Blazer on: 09/03/2019 04:17 PM   Modules accepted: Orders

## 2019-09-03 NOTE — Patient Instructions (Addendum)
Medication Instructions:   Stop Amlodipine (Norvasc),  Increase Losartan to 100mg  daily.   Begin Chlorthalidone 25mg  daily.   Begin Potassium 25meq daily.  Continue all other medications.    Labwork: none  Testing/Procedures: none  Follow-Up: As needed   Any Other Special Instructions Will Be Listed Below (If Applicable). Further blood pressure management can be managed by primary care physician.  If you need a refill on your cardiac medications before your next appointment, please call your pharmacy.

## 2019-09-04 ENCOUNTER — Ambulatory Visit: Payer: PPO | Admitting: Cardiovascular Disease

## 2019-09-10 MED ORDER — SPIRIVA RESPIMAT 2.5 MCG/ACT IN AERS: 1.0000 | INHALATION_SPRAY | Freq: Every day | RESPIRATORY_TRACT | 1 refills | Status: DC

## 2019-09-10 NOTE — Addendum Note (Signed)
Addended by: Valere Dross on: 09/10/2019 02:03 PM   Modules accepted: Orders

## 2019-09-12 ENCOUNTER — Other Ambulatory Visit: Payer: Self-pay | Admitting: Cardiovascular Disease

## 2019-09-14 ENCOUNTER — Ambulatory Visit (INDEPENDENT_AMBULATORY_CARE_PROVIDER_SITE_OTHER): Payer: PPO

## 2019-09-14 DIAGNOSIS — J309 Allergic rhinitis, unspecified: Secondary | ICD-10-CM

## 2019-09-14 MED ORDER — SPIRIVA RESPIMAT 2.5 MCG/ACT IN AERS: 1.0000 | INHALATION_SPRAY | Freq: Every day | RESPIRATORY_TRACT | 0 refills | Status: AC

## 2019-09-28 ENCOUNTER — Ambulatory Visit (INDEPENDENT_AMBULATORY_CARE_PROVIDER_SITE_OTHER): Payer: PPO

## 2019-09-28 DIAGNOSIS — J309 Allergic rhinitis, unspecified: Secondary | ICD-10-CM

## 2019-10-17 ENCOUNTER — Ambulatory Visit (INDEPENDENT_AMBULATORY_CARE_PROVIDER_SITE_OTHER): Payer: PPO

## 2019-10-17 DIAGNOSIS — J309 Allergic rhinitis, unspecified: Secondary | ICD-10-CM | POA: Diagnosis not present

## 2019-10-31 DIAGNOSIS — I10 Essential (primary) hypertension: Secondary | ICD-10-CM | POA: Diagnosis not present

## 2019-10-31 DIAGNOSIS — Z6833 Body mass index (BMI) 33.0-33.9, adult: Secondary | ICD-10-CM | POA: Diagnosis not present

## 2019-10-31 DIAGNOSIS — L03031 Cellulitis of right toe: Secondary | ICD-10-CM | POA: Diagnosis not present

## 2019-10-31 DIAGNOSIS — F419 Anxiety disorder, unspecified: Secondary | ICD-10-CM | POA: Diagnosis not present

## 2019-11-01 ENCOUNTER — Ambulatory Visit: Payer: PPO | Admitting: Psychiatry

## 2019-11-02 ENCOUNTER — Ambulatory Visit (HOSPITAL_COMMUNITY): Payer: PPO | Admitting: Psychiatry

## 2019-11-07 ENCOUNTER — Ambulatory Visit: Payer: Self-pay

## 2019-11-07 ENCOUNTER — Ambulatory Visit: Payer: PPO | Admitting: Allergy & Immunology

## 2019-11-07 ENCOUNTER — Other Ambulatory Visit: Payer: Self-pay

## 2019-11-07 ENCOUNTER — Encounter: Payer: Self-pay | Admitting: Allergy & Immunology

## 2019-11-07 VITALS — BP 164/82 | HR 82 | Temp 98.4°F | Resp 20

## 2019-11-07 DIAGNOSIS — J302 Other seasonal allergic rhinitis: Secondary | ICD-10-CM | POA: Diagnosis not present

## 2019-11-07 DIAGNOSIS — J449 Chronic obstructive pulmonary disease, unspecified: Secondary | ICD-10-CM

## 2019-11-07 DIAGNOSIS — J3089 Other allergic rhinitis: Secondary | ICD-10-CM

## 2019-11-07 DIAGNOSIS — J4522 Mild intermittent asthma with status asthmaticus: Secondary | ICD-10-CM | POA: Diagnosis not present

## 2019-11-07 DIAGNOSIS — J309 Allergic rhinitis, unspecified: Secondary | ICD-10-CM

## 2019-11-07 MED ORDER — OMEPRAZOLE 40 MG PO CPDR: 40.0000 mg | DELAYED_RELEASE_CAPSULE | Freq: Every day | ORAL | 5 refills | Status: DC

## 2019-11-07 MED ORDER — IPRATROPIUM-ALBUTEROL 0.5-2.5 (3) MG/3ML IN SOLN: 3.0000 mL | Freq: Two times a day (BID) | RESPIRATORY_TRACT | 5 refills | Status: AC

## 2019-11-07 NOTE — Progress Notes (Signed)
FOLLOW UP  Date of Service/Encounter:  11/07/19   Assessment:   Asthma-COPD overlap syndrome - very poorly controlled  Inability to tolerate inhaled steroids - ? facial swelling  Seasonal and perennial allergic rhinitis (grasses, weeds, trees, indoor molds, outdoor molds, dust mites, dog and cockroach)  Inability to tolerate antihistamines  Refusal to use nose sprays  Concern for aspirin allergy - history not entirely consistent with this  Inability to afford medications   Mr. Bueter presents for a follow-up visit.  He looks rather ill today and actually his lung function is even worse than we have ever seen it.  He needs to be on a daily medication for his asthma COPD overlap syndrome.  He also needs to stop smoking.  We did have a discussion about this.  He needs to be on a daily medication as a controller to make sure his lung function stabilizes.  However, it seems that he is unable to afford a lot of medications will try or has adverse reactions to them.  We have tried a multitude of inhaled steroids and he reports facial and neck swelling, although this is always been difficult to appreciate.  We are going to add on DuoNeb twice a day since I think this will be more affordable for him.  We did give him an nebulizer machine today.  He definitely needs to be on something to control his breathing difficulties.  We are also going to obtain a complete blood count to see if he would qualify for one of our injectable asthma medications.  These are ironically more affordable for patients with Medicare on a fixed income.   Plan/Recommendations:   1. Asthma-COPD overlap syndrome - Lung testing decreased even more but it did improve with the albuterol treatment.  - Nebulizer machine given today along with teaching. - We are going to try to find a more affordable regimen for your breathing.  - Stop the Spiriva for now - Daily controller medication(s): DuoNeb treatments twice daily  via nebulizer - Prior to physical activity: albuterol 2 puffs 10-15 minutes before physical activity. - Rescue medications: albuterol 4 puffs every 4-6 hours as needed - Asthma control goals:  * Full participation in all desired activities (may need albuterol before activity) * Albuterol use two time or less a week on average (not counting use with activity) * Cough interfering with sleep two time or less a month * Oral steroids no more than once a year * No hospitalizations  2. Seasonal and perennial allergic rhinitis (grasses, weeds, trees, indoor molds, outdoor molds, dust mites, dog and cockroach) - Continue with allergy shots at the same schedule. - Continue with Benadryl as needed for postnasal drip.   3. GERD - Continue with omeprazole 40mg  once at night.   4. Return in about 2 months (around 01/05/2020). This can be an in-person, a virtual Webex or a telephone follow up visit.   Subjective:   Jose Burch is a 69 y.o. male presenting today for follow up of  Chief Complaint  Patient presents with  . Asthma    Using Benadryl for allergies and congestion. No inhaler use.     [name] has a history of the following: Patient Active Problem List   Diagnosis Date Noted  . Hypocalcemia 08/15/2017  . Malignant neoplasm of thyroid gland ([facility]) 01/13/2017  . Postsurgical hypothyroidism 01/13/2017    History obtained from: chart review and patient.  Jose Burch is a 69 y.o. male presenting for  a follow up visit.  He was last seen in September 2020.  At that time, his lung testing was in the 55 to 60% range.  He has had multiple adverse reactions to inhaled steroids, including perceived facial swelling.  We started him on Spiriva 1 puff once daily at the last visit.  For his rhinitis, we made the decision to start allergen immunotherapy.  He was not interested in doing antihistamines and no sprays because of adverse reactions.  He had a history of a lot of mucus production at  night, so he started omeprazole 40 mg daily.  Since the last visit, he has done well, although he looks somewhat ill today. He was diagnosed with an infection at his PCP's last week. He was diagnosed with an ingrown toe nail. He was placed on Keflex three times daily.  He did receive his first COVID-19 vaccine 1 or 2 weeks ago.  Asthma/Respiratory Symptom History: As long as he takes the Benadryl for allergies and congestion, it gets the phelgm up and he does not need the inhaler, per the patient. He feels that getting rid of the mucous gets him "wide open".  He hasn ot been using the Spiriva on the daily basis. He thinks that he breathes better since using the Benadryl.  ACT is 21, indicating excellent asthma control.  He has not been to the hospital and has not needed prednisone at all since last visit.  Allergic Rhinitis Symptom History: He is taking the Benaryl on a daily basis. He is tolerating the dog and the cat better since starting the allergy shots.  He comes for shots every 2 weeks.  This is because we are out of network and he cannot afford to come more often.  Kraig is on allergen immunotherapy. He receives two injections. Immunotherapy script #1 contains trees, weeds, grasses and dog. He currently receives 0.1mL of the BLUE vial (1/100,000). Immunotherapy script #2 contains molds and dust mites. He currently receives 0.36mL of the BLUE vial (1/100,000). He started shots October of 2020 and not yet reached maintenance.   Otherwise, there have been no changes to his past medical history, surgical history, family history, or social history.    Review of Systems  Constitutional: Negative.  Negative for chills, fever, malaise/fatigue and weight loss.  HENT: Negative.  Negative for congestion, ear discharge and ear pain.        Positive for postnasal drip.  Eyes: Negative for pain, discharge and redness.  Respiratory: Positive for shortness of breath. Negative for cough, sputum  production and wheezing.   Cardiovascular: Negative.  Negative for chest pain and palpitations.  Gastrointestinal: Negative for abdominal pain, heartburn, nausea and vomiting.  Skin: Negative.  Negative for itching and rash.  Neurological: Negative for dizziness and headaches.  Endo/Heme/Allergies: Negative for environmental allergies. Does not bruise/bleed easily.       Objective:   Blood pressure (!) 164/82, pulse 82, temperature 98.4 F (36.9 C), temperature source Temporal, resp. rate 20, SpO2 96 %. There is no height or weight on file to calculate BMI.   Physical Exam:  Physical Exam  Constitutional: He appears well-developed.  Despite reporting that he feels great, he looks rather ill.  He does have to take many breaths between sentences.  HENT:  Head: Normocephalic and atraumatic.  Right Ear: Tympanic membrane, external ear and ear canal normal.  Left Ear: Tympanic membrane, external ear and ear canal normal.  Nose: Mucosal edema and rhinorrhea present. No nasal deformity or  septal deviation. No epistaxis. Right sinus exhibits no maxillary sinus tenderness and no frontal sinus tenderness. Left sinus exhibits no maxillary sinus tenderness and no frontal sinus tenderness.  Mouth/Throat: Uvula is midline and oropharynx is clear and moist. Mucous membranes are not pale and not dry.  No purulent discharge.  Eyes: Pupils are equal, round, and reactive to light. Conjunctivae and EOM are normal. Right eye exhibits no chemosis and no discharge. Left eye exhibits no chemosis and no discharge. Right conjunctiva is not injected. Left conjunctiva is not injected.  Cardiovascular: Normal rate, regular rhythm and normal heart sounds.  Respiratory: Effort normal and breath sounds normal. No accessory muscle usage. No tachypnea. No respiratory distress. He has no wheezes. He has no rhonchi. He has no rales. He exhibits no tenderness.  Moving air well in all lung fields.  No increased work of  breathing.  Lymphadenopathy:    He has no cervical adenopathy.  Neurological: He is alert.  Skin: No abrasion, no petechiae and no rash noted. Rash is not papular, not vesicular and not urticarial. No erythema. No pallor.  No abnormalities noted.  Psychiatric: He has a normal mood and affect.     Diagnostic studies:    Spirometry: results abnormal (FEV1: 1.18/39%, FVC: 1.79/40%, FEV1/FVC: 66%).    Spirometry consistent with mixed obstructive and restrictive disease. 4 puffs of albuterol treatment given in clinic with improvement in FEV1 and FVC, but not significant per ATS criteria.  Allergy Studies: none        Salvatore Marvel, MD  [facility]

## 2019-11-07 NOTE — Patient Instructions (Addendum)
1. Asthma-COPD overlap syndrome - Lung testing decreased even more but it did improve with the albuterol treatment.  - Nebulizer machine given today along with teaching. - We are going to try to find a more affordable regimen for your breathing.  - Stop the Spiriva for now - Daily controller medication(s): DuoNeb treatments twice daily via nebulizer - Prior to physical activity: albuterol 2 puffs 10-15 minutes before physical activity. - Rescue medications: albuterol 4 puffs every 4-6 hours as needed - Asthma control goals:  * Full participation in all desired activities (may need albuterol before activity) * Albuterol use two time or less a week on average (not counting use with activity) * Cough interfering with sleep two time or less a month * Oral steroids no more than once a year * No hospitalizations  2. Seasonal and perennial allergic rhinitis (grasses, weeds, trees, indoor molds, outdoor molds, dust mites, dog and cockroach) - Continue with allergy shots at the same schedule. - Continue with Benadryl as needed for postnasal drip.   3. GERD - Continue with omeprazole 40mg  once at night.   4. Return in about 2 months (around 01/05/2020). This can be an in-person, a virtual Webex or a telephone follow up visit.   Please inform us of any Emergency Department visits, hospitalizations, or changes in symptoms. Call us before going to the ED for breathing or allergy symptoms since we might be able to fit you in for a sick visit. Feel free to contact us anytime with any questions, problems, or concerns.  It was a pleasure to see you again today!  Websites that have reliable patient information: 1. American Academy of Asthma, Allergy, and Immunology: www.aaaai.org 2. Food Allergy Research and Education (FARE): foodallergy.org 3. Mothers of Asthmatics: http://www.asthmacommunitynetwork.org 4. American College of Allergy, Asthma, and Immunology: www.acaai.org   COVID-19 Vaccine  Information can be found at: ShippingScam.co.uk For questions related to vaccine distribution or appointments, please email [email or web address] or call [phone].     "Like" [place] on Facebook and Instagram for our latest updates!        Make sure you are registered to vote! If you have moved or changed any of your contact information, you will need to get this updated before voting!  In some cases, you MAY be able to register to vote online: [email or web address]

## 2019-11-09 ENCOUNTER — Other Ambulatory Visit: Payer: Self-pay | Admitting: "Endocrinology

## 2019-11-12 ENCOUNTER — Telehealth (HOSPITAL_COMMUNITY): Payer: Self-pay | Admitting: *Deleted

## 2019-11-12 MED ORDER — ESCITALOPRAM OXALATE 10 MG PO TABS: 10.0000 mg | ORAL_TABLET | Freq: Every day | ORAL | 1 refills | Status: DC

## 2019-11-12 MED ORDER — DOXEPIN HCL 50 MG PO CAPS: ORAL_CAPSULE | ORAL | 1 refills | Status: DC

## 2019-11-12 NOTE — Addendum Note (Signed)
Addended by: [place] Crane on: 11/12/2019 03:20 PM   Modules accepted: Orders

## 2019-11-12 NOTE — Telephone Encounter (Signed)
PATIENT CALLED STATED TAKING THE : traZODone (DESYREL) 50 MG tablet @ BEDTIME  & mirtazapine (REMERON) 30 MG tablet @ BEDTIME IS CAUSING HIM TO HAVE NIGHTMARES . HE SOUNDS SO WINDED  & KEEPS SAYING HE'S NOT FEELING WELL.

## 2019-11-12 NOTE — Telephone Encounter (Addendum)
I called and spoke with  the patient.  Patient reported that the combination of mirtazapine andw trazodone was making him have significant nightmares and his sleep is not of any high quality.  He stopped taking both mirtazapine and trazodone about 3 weeks ago and since then his nightmares have improved significantly.  However he is having significant difficulty in falling as well as staying asleep. He is currently being treated for an acute flareup of asthma and COPD. He also reported feeling depressed with low energy levels and feelings of helplessness.  He denied any suicidal ideations or intentions. Patient was offered Lexapro 10 mg daily for depression symptoms and was offered doxepin at bedtime to target insomnia. Potential side effects of medication and risks vs benefits of treatment vs non-treatment were explained and discussed. All questions were answered. Patient stated he is agreeable to try this combination.

## 2019-11-21 ENCOUNTER — Ambulatory Visit: Payer: PPO | Admitting: Psychiatry

## 2019-11-22 NOTE — Progress Notes (Signed)
Virtual Visit via Video Note  I connected with Jose Burch on 11/29/19 at  1:00 PM EST by a video enabled telemedicine application and verified that I am speaking with the correct person using two identifiers.   I discussed the limitations of evaluation and management by telemedicine and the availability of in person appointments. The patient expressed understanding and agreed to proceed.     I discussed the assessment and treatment plan with the patient. The patient was provided an opportunity to ask questions and all were answered. The patient agreed with the plan and demonstrated an understanding of the instructions.   The patient was advised to call back or seek an in-person evaluation if the symptoms worsen or if the condition fails to improve as anticipated.  I provided 15 minutes of non-face-to-face time during this encounter.   Jose Clay, MD    [facility] MD/PA/NP OP Progress Note  11/29/2019 1:27 PM Jose Burch  MRN:  [id number]  Chief Complaint:  Chief Complaint    Depression; Follow-up     HPI:  - He discontinued mirtazapine, trazodone due to reported nightmares. He was started on lexapro and doxepin by Dr. Toy Care  He states that he has been doing better since his medication was changed.  He feels depressed and down at times as he is stuck in the house. However on further inquiry, he states that he helps his friends by cutting grasses. He enjoys this and likes that he is helping others. He enjoys seeing his great grand child. His allergy and lung condition has been improving. He is looking forward for the spring to come. He sleeps four hours per day, which he perceives as getting better. He has fair concentration.  He has good appetite.  He denies SI.  He feels anxious and tense.  He has panic attacks every day, which he attributes to being in the house.    Wt Readings from Last 3 Encounters:  09/03/19 223 lb (101.2 kg)  06/08/19 225 lb (102.1 kg)  05/08/19 225 lb  (102.1 kg)    Visit Diagnosis:    ICD-10-CM   1. MDD (major depressive disorder), recurrent episode, mild ([place])  F33.0   2. Insomnia, unspecified type  G47.00     Past Psychiatric History: Please see initial evaluation for full details. I have reviewed the history. No updates at this time.     Past Medical History:  Past Medical History:  Diagnosis Date  . Asthma   . Cancer ([place])    Thyroid  . COPD (chronic obstructive pulmonary disease) ([place])   . Hypertension   . Insomnia   . Thyroid disease   . Tobacco abuse     Past Surgical History:  Procedure Laterality Date  . skin cancer removed    . THYROIDECTOMY  2015    Family Psychiatric History: Please see initial evaluation for full details. I have reviewed the history. No updates at this time.     Family History:  Family History  Problem Relation Age of Onset  . Cancer Mother   . Asthma Mother   . Heart attack Sister   . Allergic rhinitis Neg Hx   . Eczema Neg Hx     Social History:  Social History   Socioeconomic History  . Marital status: Married    Spouse name: Not on file  . Number of children: Not on file  . Years of education: Not on file  . Highest education level: Not on file  Occupational History  . Not on file  Tobacco Use  . Smoking status: Current Every Day Smoker    Packs/day: 0.50    Types: Cigarettes  . Smokeless tobacco: Never Used  Substance and Sexual Activity  . Alcohol use: No  . Drug use: No  . Sexual activity: Never    Partners: Female  Other Topics Concern  . Not on file  Social History Narrative  . Not on file   Social Determinants of Health   Financial Resource Strain:   . Difficulty of Paying Living Expenses: Not on file  Food Insecurity:   . Worried About Charity fundraiser in the Last Year: Not on file  . Ran Out of Food in the Last Year: Not on file  Transportation Needs:   . Lack of Transportation (Medical): Not on file  . Lack of Transportation (Non-Medical):  Not on file  Physical Activity:   . Days of Exercise per Week: Not on file  . Minutes of Exercise per Session: Not on file  Stress:   . Feeling of Stress : Not on file  Social Connections:   . Frequency of Communication with Friends and Family: Not on file  . Frequency of Social Gatherings with Friends and Family: Not on file  . Attends Religious Services: Not on file  . Active Member of Clubs or Organizations: Not on file  . Attends Archivist Meetings: Not on file  . Marital Status: Not on file    Allergies:  Allergies  Allergen Reactions  . Aspirin Hives    Metabolic Disorder Labs: No results found for: HGBA1C, MPG No results found for: PROLACTIN No results found for: CHOL, TRIG, HDL, CHOLHDL, VLDL, LDLCALC Lab Results  Component Value Date   TSH 0.158 (L) 05/05/2019   TSH 1.02 03/26/2019    Therapeutic Level Labs: No results found for: LITHIUM No results found for: VALPROATE No components found for:  CBMZ  Current Medications: Current Outpatient Medications  Medication Sig Dispense Refill  . calcitRIOL (ROCALTROL) 0.5 MCG capsule TAKE 1 CAPSULE (0.5 MCG TOTAL) BY MOUTH DAILY. 90 capsule 0  . calcium carbonate (TUMS EX) 750 MG chewable tablet Chew 1 tablet (750 mg total) by mouth 3 (three) times daily with meals. 180 tablet 3  . chlorthalidone (HYGROTON) 25 MG tablet Take 1 tablet (25 mg total) by mouth daily. 90 tablet 3  . diphenhydrAMINE (BENADRYL) 25 mg capsule Take 25 mg by mouth daily.    Derrill Memo ON 01/10/2020] doxepin (SINEQUAN) 50 MG capsule Take 1 capsule at bedtime for sleep. 30 capsule 1  . [START ON 01/10/2020] escitalopram (LEXAPRO) 10 MG tablet Take 1 tablet (10 mg total) by mouth daily. 30 tablet 1  . ipratropium-albuterol (DUONEB) 0.5-2.5 (3) MG/3ML SOLN Take 3 mLs by nebulization 2 (two) times daily. 180 mL 5  . levothyroxine (SYNTHROID) 150 MCG tablet TAKE 1 TABLET (150 MCG TOTAL) BY MOUTH DAILY BEFORE BREAKFAST. 90 tablet 1  . losartan  (COZAAR) 100 MG tablet Take 1 tablet (100 mg total) by mouth daily. 90 tablet 3  . omeprazole (PRILOSEC) 40 MG capsule Take 1 capsule (40 mg total) by mouth at bedtime. 30 capsule 5  . potassium chloride SA (KLOR-CON) 20 MEQ tablet Take 1 tablet (20 mEq total) by mouth daily. 90 tablet 3  . Tiotropium Bromide Monohydrate (SPIRIVA RESPIMAT) 2.5 MCG/ACT AERS Inhale 1 spray into the lungs daily. (Patient not taking: Reported on 11/07/2019) 12 g 0  . UNKNOWN TO PATIENT  Allergy injection every 2 weeks     No current facility-administered medications for this visit.     Musculoskeletal: Strength & Muscle Tone: N/A Gait & Station: N/A Patient leans: N/A  Psychiatric Specialty Exam: Review of Systems  Psychiatric/Behavioral: Positive for sleep disturbance. Negative for agitation, behavioral problems, confusion, decreased concentration, dysphoric mood, hallucinations, self-injury and suicidal ideas. The patient is nervous/anxious. The patient is not hyperactive.   All other systems reviewed and are negative.   There were no vitals taken for this visit.There is no height or weight on file to calculate BMI.  General Appearance: Fairly Groomed  Eye Contact:  Good  Speech:  Clear and Coherent  Volume:  Normal  Mood:  "better"  Affect:  Appropriate, Congruent and mild fatigue  Thought Process:  Coherent  Orientation:  Full (Time, Place, and Person)  Thought Content: Logical   Suicidal Thoughts:  No  Homicidal Thoughts:  No  Memory:  Immediate;   Good  Judgement:  Good  Insight:  Fair  Psychomotor Activity:  Normal  Concentration:  Concentration: Good and Attention Span: Good  Recall:  Good  Fund of Knowledge: Good  Language: Good  Akathisia:  No  Handed:  Right  AIMS (if indicated): not done  Assets:  Communication Skills Desire for Improvement  ADL's:  Intact  Cognition: WNL  Sleep:  Fair   Screenings: PHQ2-9     Office Visit from 02/13/2018 in [facility]  Office Visit from 08/15/2017 in [facility] Office Visit from 02/10/2017 in [facility] Office Visit from 01/13/2017 in [facility]  PHQ-2 Total Score  0  0  0  0       Assessment and Plan:  LYNDOL ROMUALDO is a 69 y.o. year old male with a history of depression, insomnia, malignant neoplasm of thyroid gland s/pthyroidectomyin2015. ablation , postsurgical hypothyroidism,hypocalcemia,COPD , who presents for follow up appointment for MDD (major depressive disorder), recurrent episode, mild ([facility])  Insomnia, unspecified type   #MDD, mild,  recurrent without psychotic features There has been overall improvement in his depressive symptoms after switching his medication from mirtazapine to Lexapro due to perceived side effect of nightmares.  Psychosocial stressors includes taking care of his wife, retirement in 2016, and demoralization from his medical condition.  Will continue current dose of Lexapro to target depression.  He is on the lorazepam as needed prescribed by PCP.   # Insomnia R/o sleep apnea He reports overall improvement in insomnia after starting doxepin.  Will continue current dose to target insomnia.  Noted that although he does have snoring and daytime fatigue, he is not interested in pursuing sleep study.  We will continue to discuss as needed.   Plan 1. Continue lexapro 10 mg daily  2. Continue doxepin 50 mg at night  3. He is on lorazepam 2 mg BID by primary care doctor 4. Next appointment:5/10 at 3:20 for 20 mins, video - he would like to hold off therapy  Past trials of medication:sinequan(doxepin), fluoxetine, mirtazapine (nightmares), bupropion, Ambien, Trazodone,  The patient demonstrates the following risk factors for suicide: Chronic risk factors for suicide include:psychiatric disorder ofdepressionand medical illness of COPD,thyroidcancer. Acute risk factorsfor suicide include:  unemployment and loss (financial, interpersonal, professional). Protective factorsfor this patient include: positive social support, coping skills and hope for the future. Considering these factors, the overall suicide risk at this point appears to below. Patientisappropriate for outpatient follow up.   Jose Clay, MD 11/29/2019, 1:27 PM

## 2019-11-26 ENCOUNTER — Ambulatory Visit: Payer: PPO | Admitting: "Endocrinology

## 2019-11-29 ENCOUNTER — Ambulatory Visit (INDEPENDENT_AMBULATORY_CARE_PROVIDER_SITE_OTHER): Payer: PPO | Admitting: Psychiatry

## 2019-11-29 ENCOUNTER — Other Ambulatory Visit: Payer: Self-pay

## 2019-11-29 ENCOUNTER — Encounter (HOSPITAL_COMMUNITY): Payer: Self-pay | Admitting: Psychiatry

## 2019-11-29 DIAGNOSIS — F330 Major depressive disorder, recurrent, mild: Secondary | ICD-10-CM | POA: Diagnosis not present

## 2019-11-29 DIAGNOSIS — G4700 Insomnia, unspecified: Secondary | ICD-10-CM | POA: Diagnosis not present

## 2019-11-29 MED ORDER — ESCITALOPRAM OXALATE 10 MG PO TABS: 10.0000 mg | ORAL_TABLET | Freq: Every day | ORAL | 1 refills | Status: DC

## 2019-11-29 MED ORDER — DOXEPIN HCL 50 MG PO CAPS: ORAL_CAPSULE | ORAL | 1 refills | Status: DC

## 2019-11-29 NOTE — Patient Instructions (Signed)
1. Continue lexapro 10 mg daily  2. Continue doxepin 50 mg at night  3. He is on lorazepam 2 mg BID by primary care doctor 4. Next appointment:5/10 at 3:20

## 2019-12-08 DIAGNOSIS — J4522 Mild intermittent asthma with status asthmaticus: Secondary | ICD-10-CM | POA: Diagnosis not present

## 2019-12-21 ENCOUNTER — Ambulatory Visit (INDEPENDENT_AMBULATORY_CARE_PROVIDER_SITE_OTHER): Payer: PPO

## 2019-12-21 DIAGNOSIS — J309 Allergic rhinitis, unspecified: Secondary | ICD-10-CM | POA: Diagnosis not present

## 2020-01-04 ENCOUNTER — Ambulatory Visit: Payer: Self-pay

## 2020-01-04 ENCOUNTER — Encounter: Payer: Self-pay | Admitting: Allergy & Immunology

## 2020-01-04 ENCOUNTER — Other Ambulatory Visit: Payer: Self-pay

## 2020-01-04 ENCOUNTER — Ambulatory Visit: Payer: PPO | Admitting: Allergy & Immunology

## 2020-01-04 VITALS — BP 136/78 | HR 82 | Temp 98.0°F | Resp 19

## 2020-01-04 DIAGNOSIS — J3089 Other allergic rhinitis: Secondary | ICD-10-CM | POA: Diagnosis not present

## 2020-01-04 DIAGNOSIS — J302 Other seasonal allergic rhinitis: Secondary | ICD-10-CM | POA: Diagnosis not present

## 2020-01-04 DIAGNOSIS — J309 Allergic rhinitis, unspecified: Secondary | ICD-10-CM

## 2020-01-04 DIAGNOSIS — J449 Chronic obstructive pulmonary disease, unspecified: Secondary | ICD-10-CM

## 2020-01-04 NOTE — Patient Instructions (Addendum)
1. Asthma-COPD overlap syndrome - Lung testing looked stable and slightly better today. - We are going to add on Mucinex twice daily to help with mucous thinning and clearance. - Do the nebulizer treatment in the morning at least. - Add on Acapella device a few times after the nebulizer treatment.   - Daily controller medication(s): DuoNeb treatments ONCE daily with an Acapella device daily + Spiriva 2.47mcg one puff once daily  - Prior to physical activity: albuterol 2 puffs 10-15 minutes before physical activity. - Rescue medications: albuterol 4 puffs every 4-6 hours as needed - Asthma control goals:  * Full participation in all desired activities (may need albuterol before activity) * Albuterol use two time or less a week on average (not counting use with activity) * Cough interfering with sleep two time or less a month * Oral steroids no more than once a year * No hospitalizations  2. Seasonal and perennial allergic rhinitis (grasses, weeds, trees, indoor molds, outdoor molds, dust mites, dog and cockroach) - Continue with allergy shots at the same schedule. - Continue with Benadryl as needed for postnasal drip.   3. GERD - Continue with omeprazole 40mg  once at night.   4. Return in about 3 months (around 04/05/2020). This can be an in-person, a virtual Webex or a telephone follow up visit.   Please inform us of any Emergency Department visits, hospitalizations, or changes in symptoms. Call us before going to the ED for breathing or allergy symptoms since we might be able to fit you in for a sick visit. Feel free to contact us anytime with any questions, problems, or concerns.  It was a pleasure to see you again today!  Websites that have reliable patient information: 1. American Academy of Asthma, Allergy, and Immunology: www.aaaai.org 2. Food Allergy Research and Education (FARE): foodallergy.org 3. Mothers of Asthmatics: http://www.asthmacommunitynetwork.org 4. American  College of Allergy, Asthma, and Immunology: www.acaai.org   COVID-19 Vaccine Information can be found at: ShippingScam.co.uk For questions related to vaccine distribution or appointments, please email [email or web address] or call 2394038599.     "Like" [place] on Facebook and Instagram for our latest updates!       HAPPY SPRING!  Make sure you are registered to vote! If you have moved or changed any of your contact information, you will need to get this updated before voting!  In some cases, you MAY be able to register to vote online: [email or web address]

## 2020-01-04 NOTE — Progress Notes (Signed)
FOLLOW UP  Date of Service/Encounter:  01/04/20   Assessment:   Asthma-COPD overlap syndrome - very poorly controlled  Inability to tolerate inhaled steroids - ? facial swelling  Seasonal and perennial allergic rhinitis (grasses, weeds, trees, indoor molds, outdoor molds, dust mites, dog and cockroach)  Inability to tolerate antihistamines  Refusal to use nose sprays  Concern for aspirin allergy - history not entirely consistent with this  Inability to afford medications combined with non compliance    Plan/Recommendations:   1. Asthma-COPD overlap syndrome - Lung testing looked stable and slightly better today. - We are going to add on Mucinex twice daily to help with mucous thinning and clearance. - Do the nebulizer treatment in the morning at least. - Add on Acapella device a few times after the nebulizer treatment.  - Daily controller medication(s): DuoNeb treatments ONCE daily with an Acapella device daily + Spiriva 2.59mcg one puff once daily  - Prior to physical activity: albuterol 2 puffs 10-15 minutes before physical activity. - Rescue medications: albuterol 4 puffs every 4-6 hours as needed - Asthma control goals:  * Full participation in all desired activities (may need albuterol before activity) * Albuterol use two time or less a week on average (not counting use with activity) * Cough interfering with sleep two time or less a month * Oral steroids no more than once a year * No hospitalizations  2. Seasonal and perennial allergic rhinitis (grasses, weeds, trees, indoor molds, outdoor molds, dust mites, dog and cockroach) - Continue with allergy shots at the same schedule. - Continue with Benadryl as needed for postnasal drip.   3. GERD - Continue with omeprazole 40mg  once at night.   4. Return in about 3 months (around 04/05/2020). This can be an in-person, a virtual Webex or a telephone follow up visit.   Subjective:   Jose Burch is a 69  y.o. male presenting today for follow up of  Chief Complaint  Patient presents with  . Asthma  . Allergic Rhinitis     Jose Burch has a history of the following: Patient Active Problem List   Diagnosis Date Noted  . Hypocalcemia 08/15/2017  . Malignant neoplasm of thyroid gland ([place]) 01/13/2017  . Postsurgical hypothyroidism 01/13/2017    History obtained from: chart review and patient.  Jose Burch is a 69 y.o. male presenting for a follow up visit.  He was last seen in January 2021.  At that time, he looked fairly ill and his lung function was terrible, but his ACT score was 21.  I strongly recommended that he maintained a daily medication for his asthma COPD.  I also emphasized that he needs to stop smoking.  He was having adverse reactions to all sorts of inhaled medications, with the only common thread being inhaled steroids.  We did give him a nebulizer machine at the last visit.  We ordered a CBC to see if he will qualify for an injectable medication. He is on allergy.  Asthma/Respiratory Symptom History: He tells me today that he is "full of phlegm" in the morning. This is what made his ACT score low today at 12. Once he gets rid of the mucous, he does better for the rest of the day. This phlegm is coming from "deep down" in his chest. He is still on the Spiriva. He tells me that this is the only medication that has led to any improvement in his symptoms. He can breathe "100% better".   Allergic  Rhinitis Symptom History: He remains on allergen immunotherapy. He is doing well with this. He is using on Benadryl as needed for his allergy symptoms. He is not having any problems with the allergy shots.   Jose Burch is on allergen immunotherapy. He receives two injections. Immunotherapy script #1 contains molds and dust mites. He currently receives 0.24mL of the BLUE vial (1/100,000). Immunotherapy script #2 contains trees, weeds, grasses and dog. He currently receives 0.38mL of the BLUE vial  (1/100,000). He started shots October of 2020 and not yet reached maintenance.  We are in network on his insurance, but shots are still expensive. His PCP is NOT in network so shots there would be more cost prohibitive. He has talked to someone about changing his Medicare plans, but this is only done once per year. He has not tried to find someone IN network that would give allergy shots. He does not w ant to give up his PCP, who he has had for years.   Otherwise, there have been no changes to his past medical history, surgical history, family history, or social history.    Review of Systems  Constitutional: Negative.  Negative for chills, fever, malaise/fatigue and weight loss.  HENT: Negative.  Negative for congestion, ear discharge and ear pain.        Positive for mucous production.  Eyes: Negative for pain, discharge and redness.  Respiratory: Positive for cough and sputum production. Negative for shortness of breath and wheezing.   Cardiovascular: Negative.  Negative for chest pain and palpitations.  Gastrointestinal: Negative for abdominal pain, constipation, diarrhea, heartburn, nausea and vomiting.  Skin: Negative.  Negative for itching and rash.  Neurological: Negative for dizziness and headaches.  Endo/Heme/Allergies: Negative for environmental allergies. Does not bruise/bleed easily.       Objective:   Blood pressure 136/78, pulse 82, temperature 98 F (36.7 C), temperature source Temporal, resp. rate 19, SpO2 95 %. There is no height or weight on file to calculate BMI.   Physical Exam:  Physical Exam  Constitutional: He appears well-developed.  Very talkative today. Does not seem as sick as before, but ironically his ACT score is lower.   HENT:  Head: Normocephalic and atraumatic.  Right Ear: Tympanic membrane, external ear and ear canal normal.  Left Ear: Tympanic membrane and ear canal normal.  Nose: No mucosal edema, rhinorrhea, nasal deformity or septal  deviation. No epistaxis. Right sinus exhibits no maxillary sinus tenderness and no frontal sinus tenderness. Left sinus exhibits no maxillary sinus tenderness and no frontal sinus tenderness.  Mouth/Throat: Uvula is midline and oropharynx is clear and moist. Mucous membranes are not pale and not dry.  Eyes: Pupils are equal, round, and reactive to light. Conjunctivae and EOM are normal. Right eye exhibits no chemosis and no discharge. Left eye exhibits no chemosis and no discharge. Right conjunctiva is not injected. Left conjunctiva is not injected.  Cardiovascular: Normal rate, regular rhythm and normal heart sounds.  Respiratory: Effort normal and breath sounds normal. No accessory muscle usage. No tachypnea. No respiratory distress. He has no wheezes. He has no rhonchi. He has no rales. He exhibits no tenderness.  Decreased air movement at the bases. No wheezing noted. Isolated rales in all lung fields.   Lymphadenopathy:    He has no cervical adenopathy.  Neurological: He is alert.  Skin: No abrasion, no petechiae and no rash noted. Rash is not papular, not vesicular and not urticarial. No erythema. No pallor.  Psychiatric: He has a normal  mood and affect.     Diagnostic studies:    Spirometry: results abnormal (FEV1: 1.30/39%, FVC: 2.10/47%, FEV1/FVC: 62%).    Spirometry consistent with mixed obstructive and restrictive disease. Overall, his values are slightly improved compared to those in January 2021.  Allergy Studies: none       Salvatore Marvel, MD  [facility]

## 2020-01-05 DIAGNOSIS — J4522 Mild intermittent asthma with status asthmaticus: Secondary | ICD-10-CM | POA: Diagnosis not present

## 2020-01-08 ENCOUNTER — Telehealth (HOSPITAL_COMMUNITY): Payer: Self-pay | Admitting: *Deleted

## 2020-01-08 ENCOUNTER — Telehealth: Payer: Self-pay

## 2020-01-08 ENCOUNTER — Other Ambulatory Visit (HOSPITAL_COMMUNITY): Payer: Self-pay | Admitting: Psychiatry

## 2020-01-08 MED ORDER — DOXEPIN HCL 50 MG PO CAPS: ORAL_CAPSULE | ORAL | 0 refills | Status: DC

## 2020-01-08 MED ORDER — ALBUTEROL SULFATE HFA 108 (90 BASE) MCG/ACT IN AERS: 2.0000 | INHALATION_SPRAY | Freq: Four times a day (QID) | RESPIRATORY_TRACT | 1 refills | Status: AC | PRN

## 2020-01-08 MED ORDER — ESCITALOPRAM OXALATE 10 MG PO TABS: 10.0000 mg | ORAL_TABLET | Freq: Every day | ORAL | 0 refills | Status: DC

## 2020-01-08 NOTE — Telephone Encounter (Signed)
Per provider:  Refills sent thru EPIC contact the pharmacy. Spoke with Rx & patient is wanting a 90 day supply New scripts need for 90 day supply

## 2020-01-08 NOTE — Telephone Encounter (Signed)
Patient called stating he needs another refill on his Albuterol Inhaler. I did not see where we ever sent an albuterol inhaler in. Patient states it is red.  Can someone look into this please?  [facility]

## 2020-01-08 NOTE — Telephone Encounter (Signed)
We sent in albuterol inhaler on 06/21/2019. It looks like it was discontinued by another CMA in another office on 09/03/2019. Per Dr. Ernst Bowler sent in refills to requested pharmacy.

## 2020-01-08 NOTE — Telephone Encounter (Signed)
Refill request sent via fax : doxepin (SINEQUAN) 50 MG capsule  && escitalopram (LEXAPRO) 10 MG tablet

## 2020-01-08 NOTE — Telephone Encounter (Signed)
Both medication were sent through Epic at his recent visit. Please contact the pharmacy.

## 2020-01-08 NOTE — Telephone Encounter (Signed)
Patient has been made aware.

## 2020-01-08 NOTE — Telephone Encounter (Signed)
ordered

## 2020-01-22 ENCOUNTER — Telehealth (HOSPITAL_COMMUNITY): Payer: Self-pay | Admitting: *Deleted

## 2020-01-22 NOTE — Telephone Encounter (Signed)
Will not fill this as we discontinued this medication.

## 2020-01-22 NOTE — Telephone Encounter (Signed)
[facility]FI[facility] : TRAZODONE 50 MG TABLET   11/29/2019 OFFICE NOTE STATES:: Past trials of medication:sinequan(doxepin), fluoxetine, mirtazapine (nightmares), bupropion,  Ambien, Trazodone.

## 2020-01-23 ENCOUNTER — Ambulatory Visit (INDEPENDENT_AMBULATORY_CARE_PROVIDER_SITE_OTHER): Payer: PPO

## 2020-01-23 DIAGNOSIS — J309 Allergic rhinitis, unspecified: Secondary | ICD-10-CM | POA: Diagnosis not present

## 2020-01-27 IMAGING — DX CHEST - 2 VIEW
2 series · 2 of 2 positions shown · non-contrast
Comparison: 04/10/2018

CLINICAL DATA: Productive cough.

EXAM:
CHEST - 2 VIEW

[chest pa]
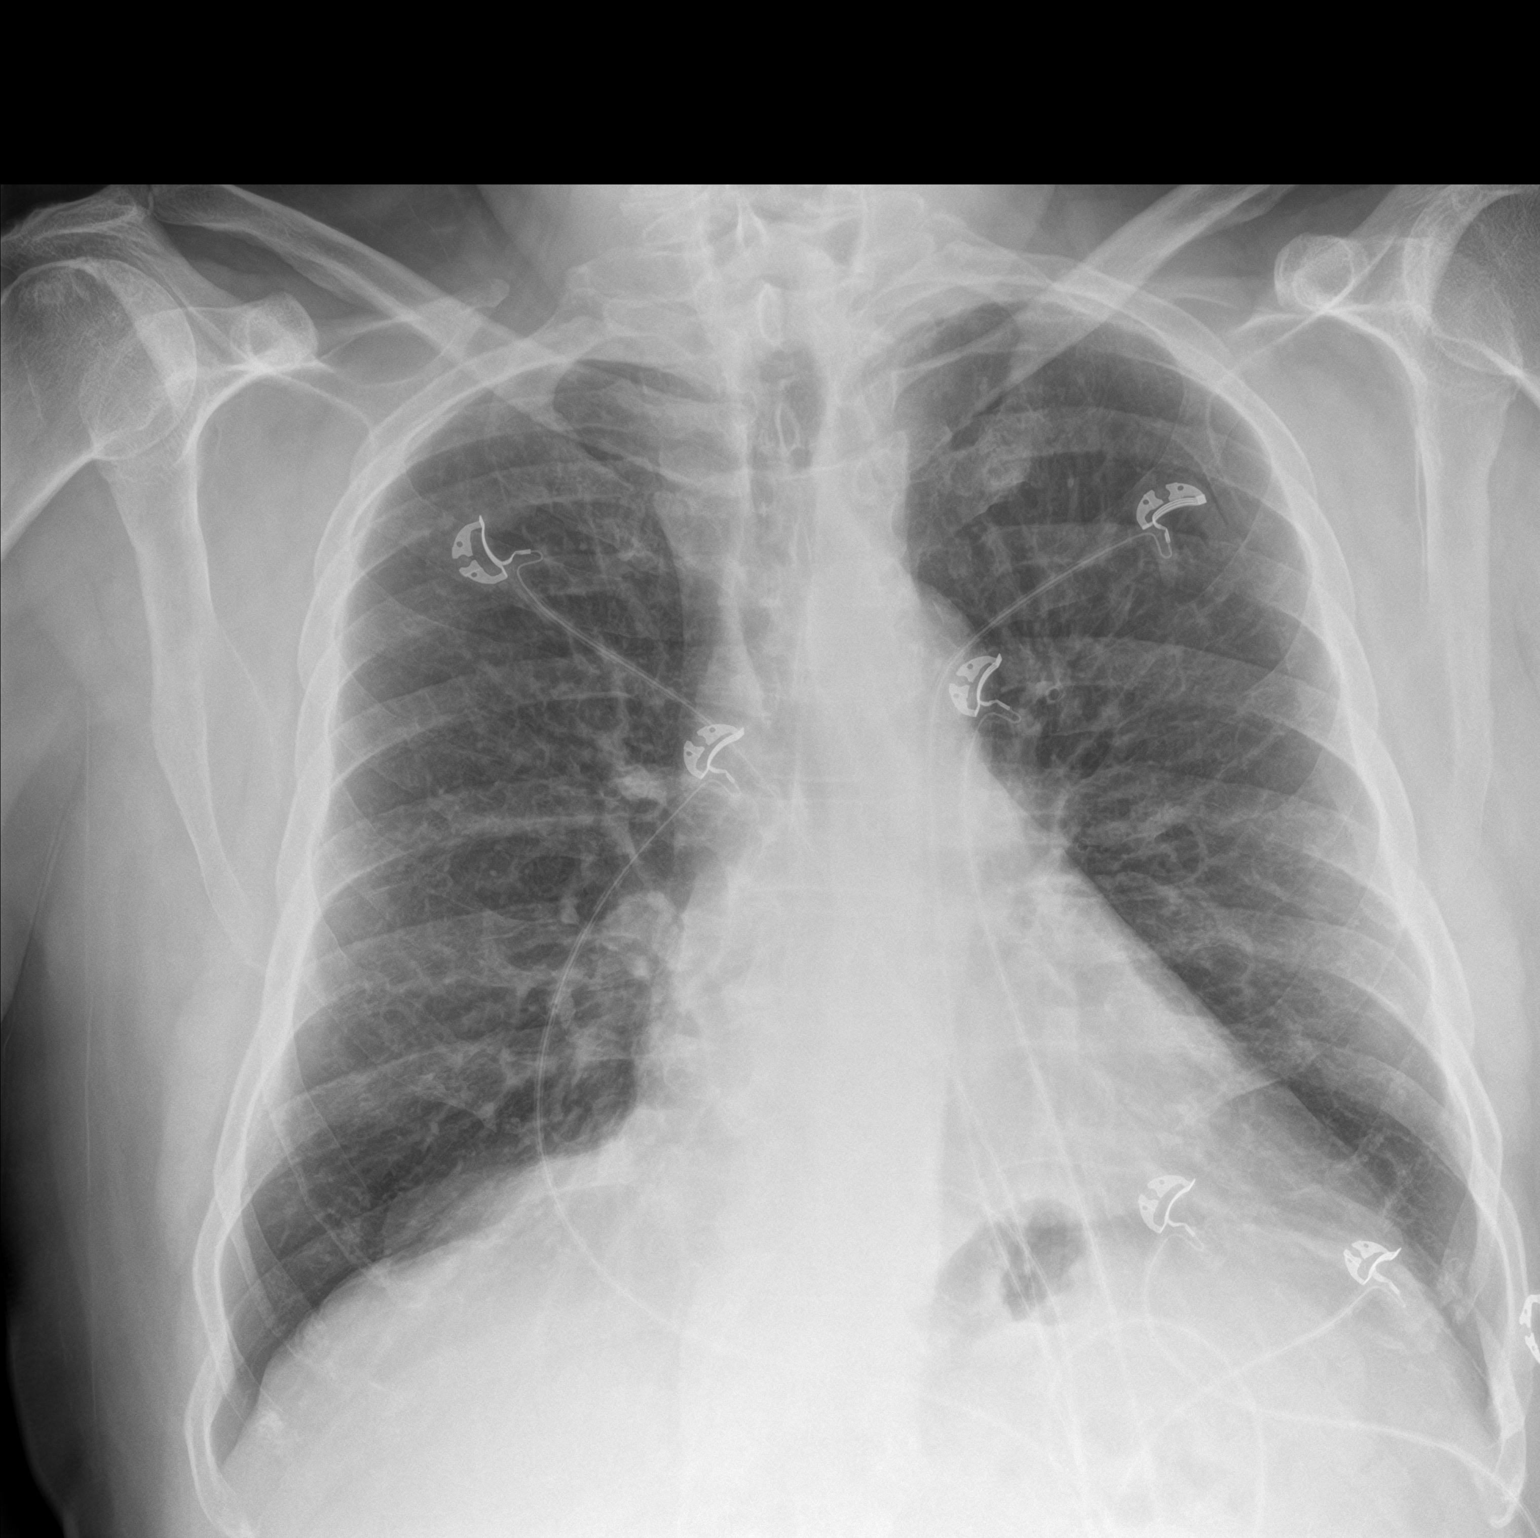

[chest lat]
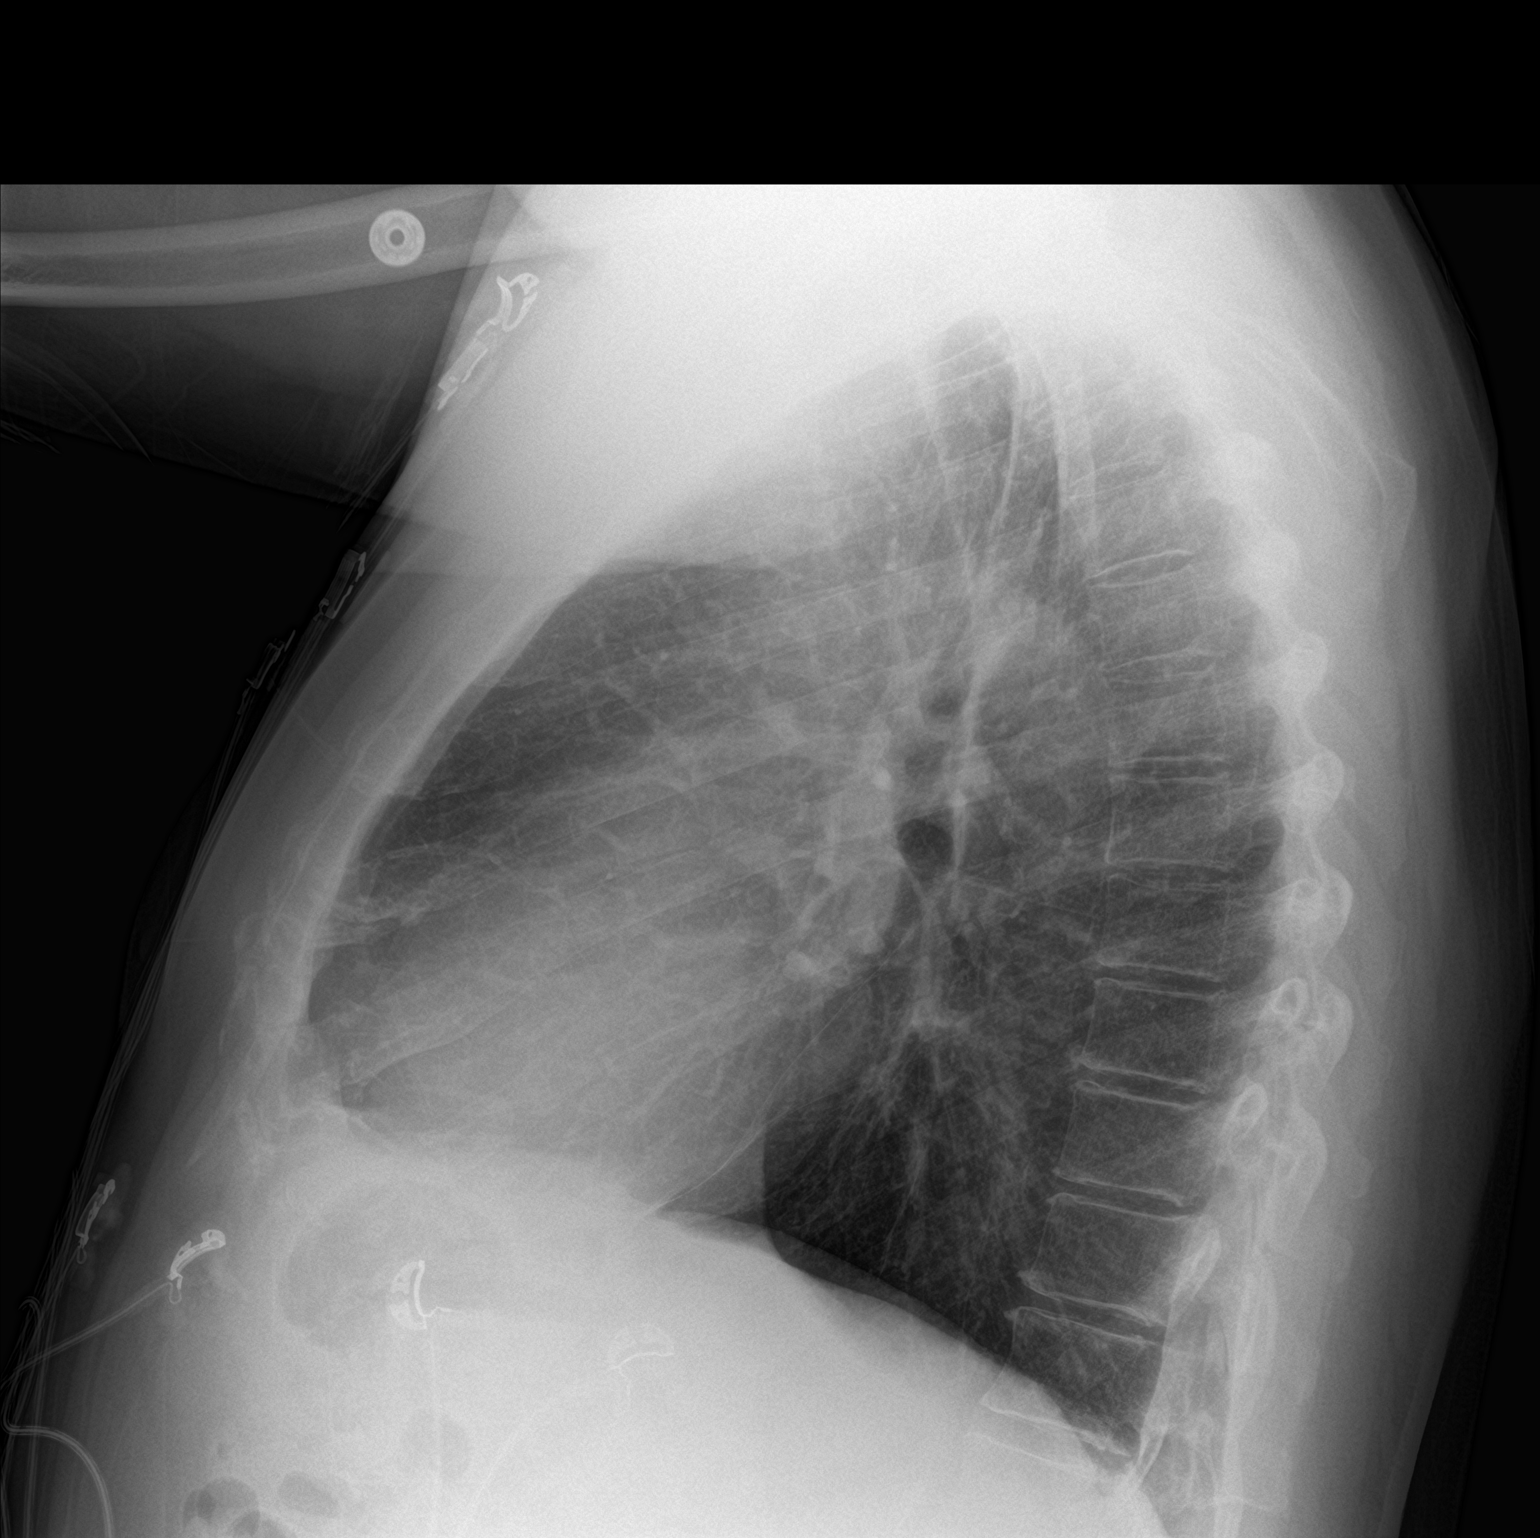

[2 of 2 positions shown; findings below may reference images not displayed]

FINDINGS: The heart size and mediastinal contours are within normal limits.
Both lungs are clear. The visualized skeletal structures are
unremarkable.
IMPRESSION: No active cardiopulmonary disease.

## 2020-01-29 DIAGNOSIS — Z713 Dietary counseling and surveillance: Secondary | ICD-10-CM | POA: Diagnosis not present

## 2020-01-29 DIAGNOSIS — F419 Anxiety disorder, unspecified: Secondary | ICD-10-CM | POA: Diagnosis not present

## 2020-01-29 DIAGNOSIS — Z6832 Body mass index (BMI) 32.0-32.9, adult: Secondary | ICD-10-CM | POA: Diagnosis not present

## 2020-01-29 DIAGNOSIS — I10 Essential (primary) hypertension: Secondary | ICD-10-CM | POA: Diagnosis not present

## 2020-01-29 DIAGNOSIS — R0989 Other specified symptoms and signs involving the circulatory and respiratory systems: Secondary | ICD-10-CM | POA: Diagnosis not present

## 2020-01-29 DIAGNOSIS — J449 Chronic obstructive pulmonary disease, unspecified: Secondary | ICD-10-CM | POA: Diagnosis not present

## 2020-02-05 DIAGNOSIS — J4522 Mild intermittent asthma with status asthmaticus: Secondary | ICD-10-CM | POA: Diagnosis not present

## 2020-02-13 ENCOUNTER — Ambulatory Visit (INDEPENDENT_AMBULATORY_CARE_PROVIDER_SITE_OTHER): Payer: PPO

## 2020-02-13 DIAGNOSIS — J309 Allergic rhinitis, unspecified: Secondary | ICD-10-CM | POA: Diagnosis not present

## 2020-02-13 NOTE — Progress Notes (Addendum)
Virtual Visit via Video Note  I connected with Jose Burch on 02/18/20 at  3:20 PM EDT by a video enabled telemedicine application and verified that I am speaking with the correct person using two identifiers.   I discussed the limitations of evaluation and management by telemedicine and the availability of in person appointments. The patient expressed understanding and agreed to proceed.     I discussed the assessment and treatment plan with the patient. The patient was provided an opportunity to ask questions and all were answered. The patient agreed with the plan and demonstrated an understanding of the instructions.   The patient was advised to call back or seek an in-person evaluation if the symptoms worsen or if the condition fails to improve as anticipated.  I provided 15 minutes of non-face-to-face time during this encounter.   Jose Clay, MD    [facility] MD/PA/NP OP Progress Note  02/18/2020 3:44 PM Jose Burch  MRN:  [id number]  Chief Complaint:  Chief Complaint    Depression; Follow-up     HPI:  This is a follow-up appointment for depression.  He states that his mood has been up and down.  He feels depressed when he watches the news seen in [place], referring to cremation.  He has been taking a walk every day.  He still has severe allergy, stating that he is taking a shot.  He feels depressed and down about not being able to be strong as he used to. He sleeps 4 to 5 hours and has insomnia.  He feels fatigued.  He has good appetite.  He has fair concentration.  He denies SI. He feels anxious. He denies panic attacks.    Visit Diagnosis:    ICD-10-CM   1. MDD (major depressive disorder), recurrent episode, mild ([place])  F33.0   2. Insomnia, unspecified type  G47.00     Past Psychiatric History: Please see initial evaluation for full details. I have reviewed the history. No updates at this time.     Past Medical History:  Past Medical History:  Diagnosis Date  .  Asthma   . Cancer ([place])    Thyroid  . COPD (chronic obstructive pulmonary disease) ([place])   . Hypertension   . Insomnia   . Thyroid disease   . Tobacco abuse     Past Surgical History:  Procedure Laterality Date  . skin cancer removed    . THYROIDECTOMY  2015    Family Psychiatric History: Please see initial evaluation for full details. I have reviewed the history. No updates at this time.     Family History:  Family History  Problem Relation Age of Onset  . Cancer Mother   . Asthma Mother   . Heart attack Sister   . Allergic rhinitis Neg Hx   . Eczema Neg Hx     Social History:  Social History   Socioeconomic History  . Marital status: Married    Spouse name: Not on file  . Number of children: Not on file  . Years of education: Not on file  . Highest education level: Not on file  Occupational History  . Not on file  Tobacco Use  . Smoking status: Current Every Day Smoker    Packs/day: 0.50    Types: Cigarettes  . Smokeless tobacco: Never Used  Substance and Sexual Activity  . Alcohol use: No  . Drug use: No  . Sexual activity: Never    Partners: Female  Other Topics Concern  .  Not on file  Social History Narrative  . Not on file   Social Determinants of Health   Financial Resource Strain:   . Difficulty of Paying Living Expenses:   Food Insecurity:   . Worried About Charity fundraiser in the Last Year:   . Arboriculturist in the Last Year:   Transportation Needs:   . Film/video editor (Medical):   Marland Kitchen Lack of Transportation (Non-Medical):   Physical Activity:   . Days of Exercise per Week:   . Minutes of Exercise per Session:   Stress:   . Feeling of Stress :   Social Connections:   . Frequency of Communication with Friends and Family:   . Frequency of Social Gatherings with Friends and Family:   . Attends Religious Services:   . Active Member of Clubs or Organizations:   . Attends Archivist Meetings:   Marland Kitchen Marital Status:      Allergies:  Allergies  Allergen Reactions  . Aspirin Hives    Metabolic Disorder Labs: No results found for: HGBA1C, MPG No results found for: PROLACTIN No results found for: CHOL, TRIG, HDL, CHOLHDL, VLDL, LDLCALC Lab Results  Component Value Date   TSH 0.158 (L) 05/05/2019   TSH 1.02 03/26/2019    Therapeutic Level Labs: No results found for: LITHIUM No results found for: VALPROATE No components found for:  CBMZ  Current Medications: Current Outpatient Medications  Medication Sig Dispense Refill  . albuterol (VENTOLIN HFA) 108 (90 Base) MCG/ACT inhaler Inhale 2 puffs into the lungs every 6 (six) hours as needed for wheezing or shortness of breath. 18 g 1  . calcitRIOL (ROCALTROL) 0.5 MCG capsule TAKE 1 CAPSULE (0.5 MCG TOTAL) BY MOUTH DAILY. 90 capsule 0  . calcium carbonate (TUMS EX) 750 MG chewable tablet Chew 1 tablet (750 mg total) by mouth 3 (three) times daily with meals. 180 tablet 3  . chlorthalidone (HYGROTON) 25 MG tablet Take 1 tablet (25 mg total) by mouth daily. 90 tablet 3  . diphenhydrAMINE (BENADRYL) 25 mg capsule Take 25 mg by mouth daily.    Marland Kitchen doxepin (SINEQUAN) 50 MG capsule Take 1 capsule at bedtime for sleep. 90 capsule 0  . doxepin (SINEQUAN) 75 MG capsule Take 1 capsule (75 mg total) by mouth at bedtime. 30 capsule 1  . escitalopram (LEXAPRO) 10 MG tablet Take 1 tablet (10 mg total) by mouth daily. 90 tablet 0  . ipratropium-albuterol (DUONEB) 0.5-2.5 (3) MG/3ML SOLN Take 3 mLs by nebulization 2 (two) times daily. 180 mL 5  . levothyroxine (SYNTHROID) 150 MCG tablet TAKE 1 TABLET (150 MCG TOTAL) BY MOUTH DAILY BEFORE BREAKFAST. 90 tablet 1  . LORazepam (ATIVAN) 2 MG tablet Take 2 mg by mouth 2 (two) times daily as needed.    Marland Kitchen losartan (COZAAR) 100 MG tablet Take 1 tablet (100 mg total) by mouth daily. 90 tablet 3  . omeprazole (PRILOSEC) 40 MG capsule Take 1 capsule (40 mg total) by mouth at bedtime. 30 capsule 5  . potassium chloride SA (KLOR-CON)  20 MEQ tablet Take 1 tablet (20 mEq total) by mouth daily. 90 tablet 3  . Tiotropium Bromide Monohydrate (SPIRIVA RESPIMAT) 2.5 MCG/ACT AERS Inhale 1 spray into the lungs daily. 12 g 0  . UNKNOWN TO PATIENT Allergy injection every 2 weeks     No current facility-administered medications for this visit.     Musculoskeletal: Strength & Muscle Tone: N/A Gait & Station: N.A Patient leans: N/A  Psychiatric Specialty Exam: Review of Systems  Psychiatric/Behavioral: Positive for dysphoric mood and sleep disturbance. Negative for agitation, behavioral problems, confusion, decreased concentration, hallucinations, self-injury and suicidal ideas. The patient is nervous/anxious. The patient is not hyperactive.   All other systems reviewed and are negative.   There were no vitals taken for this visit.There is no height or weight on file to calculate BMI.  General Appearance: Fairly Groomed  Eye Contact:  Good  Speech:  Clear and Coherent  Volume:  Normal  Mood:  "up and down"  Affect:  Appropriate, Congruent and Full Range  Thought Process:  Coherent  Orientation:  Full (Time, Place, and Person)  Thought Content: Logical   Suicidal Thoughts:  No  Homicidal Thoughts:  No  Memory:  Immediate;   Good  Judgement:  Good  Insight:  Good  Psychomotor Activity:  Normal  Concentration:  Concentration: Good and Attention Span: Good  Recall:  Good  Fund of Knowledge: Good  Language: Good  Akathisia:  No  Handed:  Right  AIMS (if indicated): not done  Assets:  Communication Skills Desire for Improvement  ADL's:  Intact  Cognition: WNL  Sleep:  Fair   Screenings: PHQ2-9     Office Visit from 02/13/2018 in [facility] Office Visit from 08/15/2017 in [facility] Office Visit from 02/10/2017 in [facility] Office Visit from 01/13/2017 in [facility]  PHQ-2 Total Score  0  0  0  0       Assessment and  Plan:  SREEKAR MANNER is a 69 y.o. year old male with a history of depression, insomnia, malignant neoplasm of thyroid gland s/pthyroidectomyin2015. ablation , postsurgical hypothyroidism,hypocalcemia,COPD, who presents for follow up appointment for MDD (major depressive disorder), recurrent episode, mild ([facility])  Insomnia, unspecified type  # MDD, mild, recurrent without psychotic features He reports depressive symptoms since the last visit.  Psychosocial stressors includes pandemic, taking care of his wife, retirement in 2016, and demoralization from his medical condition.  Will uptitrate doxepin to optimize its benefit from depression and insomnia.  Discussed potential risks which includes but not limited to palpitation and drowsiness.  Will continue Lexapro to target depression.   # Insomnia R/o sleep apnea He continues to have insomnia.  Will uptitrate doxepin as described above.  Noted that although he does have snoring and daytime fatigue, he is not interested in pursuing sleep study.  Will continue to discuss as needed.   Plan I have reviewed and updated plans as below 1. Continue lexapro 10 mg daily  2. Increase doxepin 75 mg at night  3. Next appointment:6/22 at 3:30 for 20 mins, video - on lorazepam 2 mg BID by primary care doctor - he would like to hold off therapy  Past trials of medication:sinequan(doxepin), fluoxetine, mirtazapine (nightmares), bupropion, Ambien, Trazodone,  The patient demonstrates the following risk factors for suicide: Chronic risk factors for suicide include:psychiatric disorder ofdepressionand medical illness of COPD,thyroidcancer. Acute risk factorsfor suicide include: unemployment and loss (financial, interpersonal, professional). Protective factorsfor this patient include: positive social support, coping skills and hope for the future. Considering these factors, the overall suicide risk at this point appears to below.  Patientisappropriate for outpatient follow up.   Jose Clay, MD 02/18/2020, 3:44 PM

## 2020-02-18 ENCOUNTER — Encounter (HOSPITAL_COMMUNITY): Payer: Self-pay | Admitting: Psychiatry

## 2020-02-18 ENCOUNTER — Telehealth (INDEPENDENT_AMBULATORY_CARE_PROVIDER_SITE_OTHER): Payer: PPO | Admitting: Psychiatry

## 2020-02-18 ENCOUNTER — Other Ambulatory Visit: Payer: Self-pay

## 2020-02-18 DIAGNOSIS — G4700 Insomnia, unspecified: Secondary | ICD-10-CM | POA: Diagnosis not present

## 2020-02-18 DIAGNOSIS — F330 Major depressive disorder, recurrent, mild: Secondary | ICD-10-CM | POA: Diagnosis not present

## 2020-02-18 MED ORDER — DOXEPIN HCL 75 MG PO CAPS: 75.0000 mg | ORAL_CAPSULE | Freq: Every day | ORAL | 1 refills | Status: DC

## 2020-02-18 NOTE — Patient Instructions (Signed)
1. Continue lexapro 10 mg daily  2. Increase doxepin 75 mg at night  3. Next appointment:6/22 at 3:30

## 2020-02-29 ENCOUNTER — Ambulatory Visit (INDEPENDENT_AMBULATORY_CARE_PROVIDER_SITE_OTHER): Payer: PPO

## 2020-02-29 DIAGNOSIS — J309 Allergic rhinitis, unspecified: Secondary | ICD-10-CM | POA: Diagnosis not present

## 2020-03-06 DIAGNOSIS — J4522 Mild intermittent asthma with status asthmaticus: Secondary | ICD-10-CM | POA: Diagnosis not present

## 2020-03-11 ENCOUNTER — Other Ambulatory Visit (HOSPITAL_COMMUNITY): Payer: Self-pay | Admitting: Psychiatry

## 2020-03-11 MED ORDER — DOXEPIN HCL 75 MG PO CAPS: 75.0000 mg | ORAL_CAPSULE | Freq: Every day | ORAL | 1 refills | Status: DC

## 2020-03-19 ENCOUNTER — Ambulatory Visit (INDEPENDENT_AMBULATORY_CARE_PROVIDER_SITE_OTHER): Payer: PPO

## 2020-03-19 DIAGNOSIS — J309 Allergic rhinitis, unspecified: Secondary | ICD-10-CM | POA: Diagnosis not present

## 2020-03-27 ENCOUNTER — Other Ambulatory Visit: Payer: Self-pay

## 2020-03-27 DIAGNOSIS — E890 Postprocedural hypothyroidism: Secondary | ICD-10-CM

## 2020-03-27 DIAGNOSIS — C73 Malignant neoplasm of thyroid gland: Secondary | ICD-10-CM | POA: Diagnosis not present

## 2020-03-27 DIAGNOSIS — J449 Chronic obstructive pulmonary disease, unspecified: Secondary | ICD-10-CM | POA: Diagnosis not present

## 2020-03-27 LAB — T4, FREE: Free T4: 1.3 ng/dL (ref 0.8–1.8)

## 2020-03-27 LAB — TSH: TSH: 5.14 mIU/L — ABNORMAL HIGH (ref 0.40–4.50)

## 2020-03-27 NOTE — Progress Notes (Signed)
Virtual Visit via Telephone Note  I connected with Jose Burch on 04/01/20 at  3:30 PM EDT by telephone and verified that I am speaking with the correct person using two identifiers.   I discussed the limitations, risks, security and privacy concerns of performing an evaluation and management service by telephone and the availability of in person appointments. I also discussed with the patient that there may be a patient responsible charge related to this service. The patient expressed understanding and agreed to proceed.     I discussed the assessment and treatment plan with the patient. The patient was provided an opportunity to ask questions and all were answered. The patient agreed with the plan and demonstrated an understanding of the instructions.   The patient was advised to call back or seek an in-person evaluation if the symptoms worsen or if the condition fails to improve as anticipated.  Location: patient- home, provider- home office   I provided 11 minutes of non-face-to-face time during this encounter.   [name], MD    [facility] MD/PA/NP OP Progress Note  04/01/2020 4:36 PM FLEM ENDERLE  MRN:  [id number]  Chief Complaint:  Chief Complaint    Follow-up; Depression     HPI:  This is a follow-up appointment for depression and insomnia.  He states that he occasionally feels depressed, stating that he has allergy and thyroid problem.  He feels that he sees one doctor after another. He feels tired to be sick. He mows grasses and visits his neighbors. He has not taken lexapro, stating that it caused nightmares. When he is reminded that he claimed mirtazapine to be the medication which caused nightmares, he states that he just feels comfortable staying on doxepin only. He sleeps 4-5 hours with middle insomnia. He feels fatigue. He snores at times. He has mild anhedonia. He has fair concentration. He has good appetite. He denies SI.    Visit Diagnosis:    ICD-10-CM   1.  MDD (major depressive disorder), recurrent episode, mild ([place])  F33.0   2. Insomnia, unspecified type  G47.00     Past Psychiatric History: Please see initial evaluation for full details. I have reviewed the history. No updates at this time.     Past Medical History:  Past Medical History:  Diagnosis Date  . Asthma   . Cancer ([place])    Thyroid  . COPD (chronic obstructive pulmonary disease) ([place])   . Hypertension   . Insomnia   . Thyroid disease   . Tobacco abuse     Past Surgical History:  Procedure Laterality Date  . skin cancer removed    . THYROIDECTOMY  2015    Family Psychiatric History: Please see initial evaluation for full details. I have reviewed the history. No updates at this time.     Family History:  Family History  Problem Relation Age of Onset  . Cancer Mother   . Asthma Mother   . Heart attack Sister   . Allergic rhinitis Neg Hx   . Eczema Neg Hx     Social History:  Social History   Socioeconomic History  . Marital status: Married    Spouse name: Not on file  . Number of children: Not on file  . Years of education: Not on file  . Highest education level: Not on file  Occupational History  . Not on file  Tobacco Use  . Smoking status: Current Every Day Smoker    Packs/day: 0.50  Types: Cigarettes  . Smokeless tobacco: Never Used  Vaping Use  . Vaping Use: Never used  Substance and Sexual Activity  . Alcohol use: No  . Drug use: No  . Sexual activity: Never    Partners: Female  Other Topics Concern  . Not on file  Social History Narrative  . Not on file   Social Determinants of Health   Financial Resource Strain:   . Difficulty of Paying Living Expenses:   Food Insecurity:   . Worried About Charity fundraiser in the Last Year:   . Arboriculturist in the Last Year:   Transportation Needs:   . Film/video editor (Medical):   Marland Kitchen Lack of Transportation (Non-Medical):   Physical Activity:   . Days of Exercise per Week:    . Minutes of Exercise per Session:   Stress:   . Feeling of Stress :   Social Connections:   . Frequency of Communication with Friends and Family:   . Frequency of Social Gatherings with Friends and Family:   . Attends Religious Services:   . Active Member of Clubs or Organizations:   . Attends Archivist Meetings:   Marland Kitchen Marital Status:     Allergies:  Allergies  Allergen Reactions  . Aspirin Hives    Metabolic Disorder Labs: No results found for: HGBA1C, MPG No results found for: PROLACTIN No results found for: CHOL, TRIG, HDL, CHOLHDL, VLDL, LDLCALC Lab Results  Component Value Date   TSH 5.14 (H) 03/27/2020   TSH 0.158 (L) 05/05/2019    Therapeutic Level Labs: No results found for: LITHIUM No results found for: VALPROATE No components found for:  CBMZ  Current Medications: Current Outpatient Medications  Medication Sig Dispense Refill  . albuterol (VENTOLIN HFA) 108 (90 Base) MCG/ACT inhaler Inhale 2 puffs into the lungs every 6 (six) hours as needed for wheezing or shortness of breath. 18 g 1  . calcitRIOL (ROCALTROL) 0.5 MCG capsule TAKE 1 CAPSULE (0.5 MCG TOTAL) BY MOUTH DAILY. 90 capsule 0  . calcium carbonate (TUMS EX) 750 MG chewable tablet Chew 1 tablet (750 mg total) by mouth 3 (three) times daily with meals. 180 tablet 3  . chlorthalidone (HYGROTON) 25 MG tablet Take 1 tablet (25 mg total) by mouth daily. 90 tablet 3  . diphenhydrAMINE (BENADRYL) 25 mg capsule Take 25 mg by mouth daily.    Marland Kitchen doxepin (SINEQUAN) 75 MG capsule Take 1 capsule (75 mg total) by mouth at bedtime. 90 capsule 1  . escitalopram (LEXAPRO) 10 MG tablet Take 1 tablet (10 mg total) by mouth daily. 90 tablet 0  . ipratropium-albuterol (DUONEB) 0.5-2.5 (3) MG/3ML SOLN Take 3 mLs by nebulization 2 (two) times daily. 180 mL 5  . levothyroxine (SYNTHROID) 150 MCG tablet TAKE 1 TABLET (150 MCG TOTAL) BY MOUTH DAILY BEFORE BREAKFAST. 90 tablet 1  . LORazepam (ATIVAN) 2 MG tablet Take 2  mg by mouth 2 (two) times daily as needed.    Marland Kitchen losartan (COZAAR) 100 MG tablet Take 1 tablet (100 mg total) by mouth daily. 90 tablet 3  . omeprazole (PRILOSEC) 40 MG capsule Take 1 capsule (40 mg total) by mouth at bedtime. 30 capsule 5  . potassium chloride SA (KLOR-CON) 20 MEQ tablet Take 1 tablet (20 mEq total) by mouth daily. 90 tablet 3  . Tiotropium Bromide Monohydrate (SPIRIVA RESPIMAT) 2.5 MCG/ACT AERS Inhale 1 spray into the lungs daily. 12 g 0  . UNKNOWN TO PATIENT Allergy injection  every 2 weeks     No current facility-administered medications for this visit.     Musculoskeletal: Strength & Muscle Tone: N/A Gait & Station: N/A Patient leans: N/A  Psychiatric Specialty Exam: Review of Systems  Psychiatric/Behavioral: Positive for dysphoric mood and sleep disturbance. Negative for agitation, behavioral problems, confusion, decreased concentration, hallucinations, self-injury and suicidal ideas. The patient is not nervous/anxious and is not hyperactive.   All other systems reviewed and are negative.   There were no vitals taken for this visit.There is no height or weight on file to calculate BMI.  General Appearance: NA  Eye Contact:  NA  Speech:  Clear and Coherent  Volume:  Normal  Mood:  tired  Affect:  NA  Thought Process:  Coherent  Orientation:  Full (Time, Place, and Person)  Thought Content: Logical   Suicidal Thoughts:  No  Homicidal Thoughts:  No  Memory:  Immediate;   Good  Judgement:  Good  Insight:  Fair  Psychomotor Activity:  Normal  Concentration:  Concentration: Good and Attention Span: Good  Recall:  Good  Fund of Knowledge: Good  Language: Good  Akathisia:  No  Handed:  Right  AIMS (if indicated): not done  Assets:  Communication Skills Desire for Improvement  ADL's:  Intact  Cognition: WNL  Sleep:  Poor   Screenings: PHQ2-9     Office Visit from 02/13/2018 in [facility] Office Visit from 08/15/2017 in [facility] Office Visit from 02/10/2017 in [facility] Office Visit from 01/13/2017 in [facility]  PHQ-2 Total Score 0 0 0 0       Assessment and Plan:  COURT GRACIA is a 69 y.o. year old male with a history of depression, insomnia, malignant neoplasm of thyroid gland s/pthyroidectomyin2015. ablation , postsurgical hypothyroidism,hypocalcemia,COPD, who presents for follow up appointment for below.   1. MDD (major depressive disorder), recurrent episode, mild ([place]) 2. Insomnia, unspecified type He reports occasional depressive symptoms in the context of demoralization due to his medical condition and self discontinuation of Lexapro.  Other psychosocial stressors includes retirement in 2016, and taking care of his wife.  Although it is recommended to restart Lexapro, he declined it as he perceives it caused nightmares (although it was previously claimed that mirtazapine caused it. Will continue doxepin only at this time to target both depression and insomnia.  Discussed potential risks, which includes but not limited to palpitation and drowsiness.   # Insomnia R/o sleep apnea He does have snoring, daytime fatigue and middle insomnia.  Although he will greatly benefit from sleep evaluation, he declined it at this time as he has many other appointments to pursue. Will continue to discuss as needed.   Plan I have reviewed and updated plans as below 1. Continue doxepin 75 mg at night  2.Next appointment:8/11 at 10 AM  for 20 mins, video -he self discontinued lexapro - on lorazepam 2 mg BID by primary care doctor - he is not interested in therapy at this time  Past trials of medication:sinequan(doxepin), fluoxetine,mirtazapine (nightmares),bupropion, Ambien, Trazodone,  The patient demonstrates the following risk factors for suicide: Chronic risk factors for suicide include:psychiatric disorder ofdepressionand medical  illness of COPD,thyroidcancer. Acute risk factorsfor suicide include: unemployment and loss (financial, interpersonal, professional). Protective factorsfor this patient include: positive social support, coping skills and hope for the future. Considering these factors, the overall suicide risk at this point appears to below. Patientisappropriate for outpatient follow up.   [name], MD  04/01/2020, 4:36 PM

## 2020-03-28 LAB — CBC WITH DIFFERENTIAL/PLATELET
Basophils Absolute: 0.1 10*3/uL (ref 0.0–0.2)
Basos: 1 %
EOS (ABSOLUTE): 0.4 10*3/uL (ref 0.0–0.4)
Eos: 3 %
Hematocrit: 47.5 % (ref 37.5–51.0)
Hemoglobin: 15.7 g/dL (ref 13.0–17.7)
Immature Grans (Abs): 0.1 10*3/uL (ref 0.0–0.1)
Immature Granulocytes: 1 %
Lymphocytes Absolute: 3.9 10*3/uL — ABNORMAL HIGH (ref 0.7–3.1)
Lymphs: 27 %
MCH: 27.9 pg (ref 26.6–33.0)
MCHC: 33.1 g/dL (ref 31.5–35.7)
MCV: 85 fL (ref 79–97)
Monocytes Absolute: 1 10*3/uL — ABNORMAL HIGH (ref 0.1–0.9)
Monocytes: 7 %
Neutrophils Absolute: 8.8 10*3/uL — ABNORMAL HIGH (ref 1.4–7.0)
Neutrophils: 61 %
Platelets: 343 10*3/uL (ref 150–450)
RBC: 5.62 x10E6/uL (ref 4.14–5.80)
RDW: 14.7 % (ref 11.6–15.4)
WBC: 14.3 10*3/uL — ABNORMAL HIGH (ref 3.4–10.8)

## 2020-04-01 ENCOUNTER — Encounter: Payer: Self-pay | Admitting: Allergy & Immunology

## 2020-04-01 ENCOUNTER — Telehealth (INDEPENDENT_AMBULATORY_CARE_PROVIDER_SITE_OTHER): Payer: PPO | Admitting: Psychiatry

## 2020-04-01 ENCOUNTER — Telehealth: Payer: Self-pay | Admitting: *Deleted

## 2020-04-01 ENCOUNTER — Other Ambulatory Visit: Payer: Self-pay

## 2020-04-01 ENCOUNTER — Encounter (HOSPITAL_COMMUNITY): Payer: Self-pay | Admitting: Psychiatry

## 2020-04-01 DIAGNOSIS — F330 Major depressive disorder, recurrent, mild: Secondary | ICD-10-CM | POA: Diagnosis not present

## 2020-04-01 DIAGNOSIS — G4700 Insomnia, unspecified: Secondary | ICD-10-CM

## 2020-04-01 NOTE — Telephone Encounter (Signed)
Spoke to patient and e advised has appt this Friday with Ernst Bowler. Advised him he can sign [place] app and bring income verification (his and wife social security benefit letter) to office to have [place] send to me to try to get patient on free medication due to cost for [facility] patients

## 2020-04-01 NOTE — Telephone Encounter (Signed)
-----   Message from Valentina Shaggy, MD sent at 03/31/2020  9:15 AM EDT ----- Can someone call the patient to let him know about his lab results?  His eosinophils are 400, which is high enough to get him approved for an anti-IL-5 medication.  We will probably go with Berna Bue since he lives so far away  from the clinic. I will route to Bebe Moncure to get that process started.   Tyreanna Bisesi - He has failed most of the controllers due to adverse effects (perceived facial and neck swelling). He is on Spiriva with DuNneb treatments PRN.   Salvatore Marvel, MD [facility] [place] [place]

## 2020-04-01 NOTE — Telephone Encounter (Signed)
L/m for patient to contact office to discuss starting [employer] and patient assistance due to [facility]

## 2020-04-02 NOTE — Telephone Encounter (Signed)
Patient called and said he was called about lab results. He would like a call back.

## 2020-04-03 ENCOUNTER — Other Ambulatory Visit: Payer: Self-pay

## 2020-04-03 ENCOUNTER — Encounter: Payer: Self-pay | Admitting: "Endocrinology

## 2020-04-03 ENCOUNTER — Ambulatory Visit: Payer: PPO | Admitting: "Endocrinology

## 2020-04-03 VITALS — BP 167/76 | HR 95 | Ht 70.5 in | Wt 234.4 lb

## 2020-04-03 DIAGNOSIS — E890 Postprocedural hypothyroidism: Secondary | ICD-10-CM

## 2020-04-03 DIAGNOSIS — C73 Malignant neoplasm of thyroid gland: Secondary | ICD-10-CM

## 2020-04-03 MED ORDER — LEVOTHYROXINE SODIUM 175 MCG PO TABS: 175.0000 ug | ORAL_TABLET | Freq: Every day | ORAL | 1 refills | Status: AC

## 2020-04-03 NOTE — Progress Notes (Signed)
04/03/2020  Endocrinology follow-up note   Subjective:    Patient ID: Jose Burch, male    DOB: [birth year], PCP Andres Shad, MD   Past Medical History:  Diagnosis Date  . Asthma   . Cancer ([place])    Thyroid  . COPD (chronic obstructive pulmonary disease) ([place])   . Hypertension   . Insomnia   . Thyroid disease   . Tobacco abuse    Past Surgical History:  Procedure Laterality Date  . skin cancer removed    . THYROIDECTOMY  2015   Social History   Socioeconomic History  . Marital status: Married    Spouse name: Not on file  . Number of children: Not on file  . Years of education: Not on file  . Highest education level: Not on file  Occupational History  . Not on file  Tobacco Use  . Smoking status: Current Every Day Smoker    Packs/day: 0.50    Types: Cigarettes  . Smokeless tobacco: Never Used  Vaping Use  . Vaping Use: Never used  Substance and Sexual Activity  . Alcohol use: No  . Drug use: No  . Sexual activity: Never    Partners: Female  Other Topics Concern  . Not on file  Social History Narrative  . Not on file   Social Determinants of Health   Financial Resource Strain:   . Difficulty of Paying Living Expenses:   Food Insecurity:   . Worried About Charity fundraiser in the Last Year:   . Arboriculturist in the Last Year:   Transportation Needs:   . Film/video editor (Medical):   Marland Kitchen Lack of Transportation (Non-Medical):   Physical Activity:   . Days of Exercise per Week:   . Minutes of Exercise per Session:   Stress:   . Feeling of Stress :   Social Connections:   . Frequency of Communication with Friends and Family:   . Frequency of Social Gatherings with Friends and Family:   . Attends Religious Services:   . Active Member of Clubs or Organizations:   . Attends Archivist Meetings:   Marland Kitchen Marital Status:    Outpatient Encounter Medications as of 04/03/2020  Medication Sig  . albuterol (VENTOLIN HFA) 108 (90  Base) MCG/ACT inhaler Inhale 2 puffs into the lungs every 6 (six) hours as needed for wheezing or shortness of breath.  . calcitRIOL (ROCALTROL) 0.5 MCG capsule TAKE 1 CAPSULE (0.5 MCG TOTAL) BY MOUTH DAILY.  . calcium carbonate (TUMS EX) 750 MG chewable tablet Chew 1 tablet (750 mg total) by mouth 3 (three) times daily with meals.  . diphenhydrAMINE (BENADRYL) 25 mg capsule Take 25 mg by mouth daily.  Marland Kitchen doxepin (SINEQUAN) 75 MG capsule Take 1 capsule (75 mg total) by mouth at bedtime.  Marland Kitchen ipratropium-albuterol (DUONEB) 0.5-2.5 (3) MG/3ML SOLN Take 3 mLs by nebulization 2 (two) times daily.  Marland Kitchen levothyroxine (SYNTHROID) 175 MCG tablet Take 1 tablet (175 mcg total) by mouth daily before breakfast.  . LORazepam (ATIVAN) 2 MG tablet Take 2 mg by mouth 2 (two) times daily as needed.  Marland Kitchen losartan (COZAAR) 100 MG tablet Take 1 tablet (100 mg total) by mouth daily.  . potassium chloride SA (KLOR-CON) 20 MEQ tablet Take 1 tablet (20 mEq total) by mouth daily.  . Tiotropium Bromide Monohydrate (SPIRIVA RESPIMAT) 2.5 MCG/ACT AERS Inhale 1 spray into the lungs daily.  Marland Kitchen UNKNOWN TO PATIENT Allergy injection every 2 weeks  . [  DISCONTINUED] chlorthalidone (HYGROTON) 25 MG tablet Take 1 tablet (25 mg total) by mouth daily.  . [DISCONTINUED] escitalopram (LEXAPRO) 10 MG tablet Take 1 tablet (10 mg total) by mouth daily.  . [DISCONTINUED] levothyroxine (SYNTHROID) 150 MCG tablet TAKE 1 TABLET (150 MCG TOTAL) BY MOUTH DAILY BEFORE BREAKFAST.  . [DISCONTINUED] omeprazole (PRILOSEC) 40 MG capsule Take 1 capsule (40 mg total) by mouth at bedtime.   No facility-administered encounter medications on file as of 04/03/2020.   ALLERGIES: Allergies  Allergen Reactions  . Aspirin Hives    VACCINATION STATUS:  There is no immunization history on file for this patient.  HPI 69 year old male with multiple medical problems as above. He is being seen in Follow-up for history of papillary thyroid cancer, post thyroidectomy  hypothyroidism.  - He is status post total thyroidectomy in 02/19/2014 followed by remnant ablation with I-131 On 03/22/2014, post therapy scan on 04/01/2014 showing 2 foci of indeterminate activity identified in the suspected neck region.  - He underwent Thyrogen stimulated whole-body scan which was negative for metastatic thyroid cancer- on February 04, 2017. -His previsit thyroid/neck ultrasound is negative for any residual or recurrent thyroid lesions. -  He is on currently on levothyroxine 150 mcg p.o. daily before breakfast.  He reports compliance to this medication.  His previsit labs are consistent with under replacement.   -He denies any new complaints today.  No heat/cold intolerance.  He denies tremors, palpitations.  He is gaining weight. - His recent labs still  show mild hypocalcemia at 7.3.  He admits to inconsistency in taking his calcium, taking it only one time instead of 3 times recommended.  He denies dysphagia, shortness of breath, voice change. -  He was supposed to be on calcium carbonate 750 mg p.o. 3 x  daily, and calcitriol 0.5 g daily since the time of his surgery. He reports that surgery was relatively difficult taking  6 hours to complete.   He denies family history of thyroid cancer nor any thyroid dysfunction. He is a chronic active smoker for the last 15 years. He has hypertension on treatment. -He complains of fatigue, intermittent wheezing.  Review of Systems  Constitutional:  + Weight gain ,  + fatigue, no subjective hyperthermia, no subjective hypothermia Eyes: no blurry vision, no xerophthalmia ENT: no sore throat, no nodules palpated in throat, no dysphagia, no odynophagia, no voice change.   Cardiovascular: no Chest Pain, no Shortness of Breath, no palpitations, no leg swelling Respiratory: + cough, no SOB Gastrointestinal: no Nausea/Vomiting/Diarhhea Musculoskeletal: no muscle/joint aches Skin: no rashes Neurological: no tremors, no numbness, no  tingling, no dizziness Psychiatric: no depression, no anxiety  Objective:    BP (!) 167/76   Pulse 95   Ht 5' 10.5" (1.791 m)   Wt 234 lb 6.4 oz (106.3 kg)   BMI 33.16 kg/m   Wt Readings from Last 3 Encounters:  04/03/20 234 lb 6.4 oz (106.3 kg)  09/03/19 223 lb (101.2 kg)  06/08/19 225 lb (102.1 kg)    Physical Exam  Constitutional: + Obese for height,  not in acute distress, normal state of mind Eyes: PERRLA, EOMI, no exophthalmos ENT: moist mucous membranes, + long horizontal lower neck surgical scar from prior total thyroidectomy/neck dissection , no cervical lymphadenopathy  Musculoskeletal: no gross deformities, strength intact in all four extremities Skin: moist, warm, no rashes Neurological: no tremor with outstretched hands, Deep tendon reflexes normal in all four extremities.   Recent Results (from the past 2160 hour(s))  TSH     Status: Abnormal   Collection Time: 03/27/20  9:33 AM  Result Value Ref Range   TSH 5.14 (H) 0.40 - 4.50 mIU/L  T4, Free     Status: None   Collection Time: 03/27/20  9:33 AM  Result Value Ref Range   Free T4 1.3 0.8 - 1.8 ng/dL  CBC with Differential/Platelet     Status: Abnormal   Collection Time: 03/27/20  9:53 AM  Result Value Ref Range   WBC 14.3 (H) 3.4 - 10.8 x10E3/uL   RBC 5.62 4.14 - 5.80 x10E6/uL   Hemoglobin 15.7 13.0 - 17.7 g/dL   Hematocrit 47.5 37.5 - 51.0 %   MCV 85 79 - 97 fL   MCH 27.9 26.6 - 33.0 pg   MCHC 33.1 31 - 35 g/dL   RDW 14.7 11.6 - 15.4 %   Platelets 343 150 - 450 x10E3/uL   Neutrophils 61 Not Estab. %   Lymphs 27 Not Estab. %   Monocytes 7 Not Estab. %   Eos 3 Not Estab. %   Basos 1 Not Estab. %   Neutrophils Absolute 8.8 (H) 1 - 7 x10E3/uL   Lymphocytes Absolute 3.9 (H) 0 - 3 x10E3/uL   Monocytes Absolute 1.0 (H) 0 - 0 x10E3/uL   EOS (ABSOLUTE) 0.4 0.0 - 0.4 x10E3/uL   Basophils Absolute 0.1 0 - 0 x10E3/uL   Immature Granulocytes 1 Not Estab. %   Immature Grans (Abs) 0.1 0.0 - 0.1 x10E3/uL      Assessment & Plan:   1. Malignant neoplasm of thyroid gland ([place]) 2. Postsurgical hypothyroidism  -  He underwent total thyroidectomy on 02/19/2014 followed by thyroid remnant ablation with I-131 on 03/22/2014, was therapy whole-body scan on 04/01/2014 showed 2 foci of indeterminate activity identified in the neck region. No subsequent imaging studies for him. - Thyrogen stimulated whole-body scan - no evidence of metastatic thyroid cancer- done on 02/04/2017 - His previsit thyroid/neck ultrasound is negative for residual or recurrent thyroid lesions.  -He will be considered for Thyrogen stimulated whole-body scan before his next visit in 6 months.  We will also have thyroglobulin levels along with thyroglobulin antibodies.   -  His repeat thyroid function test are consistent with inadequate replacement.  I discussed and increase his levothyroxine to 175 mcg p.o. daily before breakfast.     - We discussed about the correct intake of his thyroid hormone, on empty stomach at fasting, with water, separated by at least 30 minutes from breakfast and other medications,  and separated by more than 4 hours from calcium, iron, multivitamins, acid reflux medications (PPIs). -Patient is made aware of the fact that thyroid hormone replacement is needed for life, dose to be adjusted by periodic monitoring of thyroid function tests.   3. Hypocalcemia - Likely a result of partial surgical hypoparathyroidism.  He is serum calcium level is suboptimal at 7.3, due to his inconsistency taking his calcium.  He is advised to resume calcium supplements with calcium carbonate 750 mg p.o. 3 times daily with meals along with his calcitriol 0.5 mcg p.o. every morning with breakfast.    He is extensively counseled for smoking cessation. - I advised patient to maintain close follow up with Andres Shad, MD for primary care needs.     - Time spent on this patient care encounter:  20 minutes of which 50%  was spent in  counseling and the rest reviewing  his current and  previous labs /  studies and medications  doses and developing a plan for long term care. [name]  participated in the discussions, expressed understanding, and voiced agreement with the above plans.  All questions were answered to his satisfaction. he is encouraged to contact clinic should he have any questions or concerns prior to his return visit.  Follow up plan: Return in about 6 months (around 10/03/2020) for F/U with Pre-visit Labs, F/U with Whole Body Scan w/Thyrogen.  Glade Lloyd, MD Phone: [phone]  Fax: [phone]  -  This note was partially dictated with voice recognition software. Similar sounding words can be transcribed inadequately or may not  be corrected upon review.  04/03/2020, 2:10 PM

## 2020-04-04 ENCOUNTER — Ambulatory Visit (INDEPENDENT_AMBULATORY_CARE_PROVIDER_SITE_OTHER): Payer: PPO | Admitting: Allergy & Immunology

## 2020-04-04 ENCOUNTER — Encounter: Payer: Self-pay | Admitting: Allergy & Immunology

## 2020-04-04 VITALS — BP 140/78 | HR 85 | Resp 19

## 2020-04-04 DIAGNOSIS — J3089 Other allergic rhinitis: Secondary | ICD-10-CM | POA: Diagnosis not present

## 2020-04-04 DIAGNOSIS — J302 Other seasonal allergic rhinitis: Secondary | ICD-10-CM

## 2020-04-04 DIAGNOSIS — J449 Chronic obstructive pulmonary disease, unspecified: Secondary | ICD-10-CM

## 2020-04-04 NOTE — Progress Notes (Signed)
FOLLOW UP  Date of Service/Encounter:  04/04/20   Assessment:   Asthma-COPD overlap syndrome - very poorly controlledwith eosinophilic phenotype  Inability to tolerate inhaled steroids - ? facial swelling  Seasonal and perennial allergic rhinitis(grasses, weeds, trees, indoor molds, outdoor molds, dust mites, dog and cockroach)  Inability to tolerate antihistamines  Refusal to use nose sprays  Concern for aspirin allergy - history not entirely consistent with this  Inability to afford medications combined with non compliance   Plan/Recommendations:   1. Asthma-COPD overlap syndrome - Lung testing looked stable. - Information on Fasenra provided.  - Consent form provided.  - We are going to go ahead and submit for approval.  - Daily controller medication(s): DuoNeb treatments ONCE daily with an Acapella device daily + Spiriva 2.77mcg one puff once daily  - Prior to physical activity: albuterol 2 puffs 10-15 minutes before physical activity. - Rescue medications: albuterol 4 puffs every 4-6 hours as needed - Asthma control goals:  * Full participation in all desired activities (may need albuterol before activity) * Albuterol use two time or less a week on average (not counting use with activity) * Cough interfering with sleep two time or less a month * Oral steroids no more than once a year * No hospitalizations  2. Seasonal and perennial allergic rhinitis (grasses, weeds, trees, indoor molds, outdoor molds, dust mites, dog and cockroach) - Continue with allergy shots at the same schedule.  - Continue with Benadryl as needed for postnasal drip.   3. GERD - Continue with omeprazole 40mg  once at night.   4. Return in about 3 months (around 07/05/2020). This can be an in-person, a virtual Webex or a telephone follow up visit.  Subjective:   Jose Burch is a 69 y.o. male presenting today for follow up of  Chief Complaint  Patient presents with  . Asthma     Jose Burch has a history of the following: Patient Active Problem List   Diagnosis Date Noted  . Hypocalcemia 08/15/2017  . Malignant neoplasm of thyroid gland ([place]) 01/13/2017  . Postsurgical hypothyroidism 01/13/2017    History obtained from: chart review and patient.  Jose Burch is a 69 y.o. male presenting for a follow up visit.  He was last seen in March 2021.  At that time, his lung testing looks slightly better but overall stable.  We did add on Mucinex twice daily to help with mucus thinning and clearance.  We recommended doing the nebulizer treatment at least once in the morning.  We added on an Acapella device to use twice daily.  We continued with DuoNeb once daily and Spiriva 2.5 mcg 1 puff once daily.  We did obtain labs to see if he would qualify for biologic.  For his allergic rhinitis, would continue with allergy shots at the same schedule as well as Benadryl as needed.  GERD was controlled with omeprazole 40 mg at night.  His complete blood count was notable for an absolute eosinophil count of 400.  We attempted to call in a couple of times to explain the elevated eosinophil count and discussed biologic initiation, but he was thoroughly confused every time our team reached out to him.   Since last visit, he has mostly done well. He does have some questions about his thyroid labs today, which I reminded him on a couple of occasions that I did not order. I recommended that he talk to Dr. Dorris Fetch instead.  Asthma/Respiratory Symptom History: He is using  the Spiriva once daily. HE is not using the DuoNebs at all. He uses his emergency inhaler around once per day. He does have chronic SOB as well as swelling over his entire body. It should be noted that he does follow with Cardiology. ACT is 11 today, indicating poor asthma control. He has not been in the ED and has not been hospitalized for his symptoms.   Allergic Rhinitis Symptom History: He remains on the allergy shots, which are  going well overall. He is continuing with his Benadryl as needed. He does not tolerate nose sprays.   Jose Burch is on allergen immunotherapy. He receives two injections. Immunotherapy script #1 contains molds and dust mites. He currently receives 0.53mL of the GOLD vial (1/10,000). Immunotherapy script #2 contains trees, weeds, grasses and dog. He currently receives 0.50mL of the GOLD vial (1/10,000). He started shots October of 2020 and not yet reached maintenance.  He does see Dr. Dorris Fetch for postsurgical hypothyroidism.  He underwent a thyroidectomy in May 2015 with ablation in June 2015.  He was on levothyroxine 150 mcg, but this was increased to 175 mcg yesterday.   Otherwise, there have been no changes to his past medical history, surgical history, family history, or social history.    Review of Systems  Constitutional: Negative.  Negative for fever, malaise/fatigue and weight loss.       Positive for swelling.  HENT: Positive for congestion and sore throat. Negative for ear discharge, ear pain and sinus pain.   Eyes: Negative for pain, discharge and redness.  Respiratory: Positive for cough, sputum production and shortness of breath. Negative for wheezing.   Cardiovascular: Negative.  Negative for chest pain and palpitations.  Gastrointestinal: Negative for abdominal pain, constipation, diarrhea, heartburn, nausea and vomiting.  Skin: Negative.  Negative for itching and rash.  Neurological: Negative for dizziness and headaches.  Endo/Heme/Allergies: Negative for environmental allergies. Does not bruise/bleed easily.       Objective:   Blood pressure 140/78, pulse 85, resp. rate 19, SpO2 95 %. There is no height or weight on file to calculate BMI.   Physical Exam: General:  alert, active, in no acute distress. Obese male. Seems euthymic. Head:  normocephalic, no masses, lesions, tenderness or abnormalities Eyes:  conjunctiva clear without injection or discharge, EOMI, PERL Ears:   TM's pearly white bilaterally, external auditory canals are clear, external ears are normally set and rotated Nose:  External nose within normal limits, erythematous appearing turbinates, clear-colored discharge, septum midline Throat:  moist mucous membranes without erythema, exudates or petechiae, no thrush Neck:  Supple without thyromegaly or adenopathy appreciated Lungs:  clear to auscultation, no wheezing, crackles or rhonchi, breathing unlabored, moving air well in all lung fields Heart:  regular rate and rhythm, normal S1/S2, no murmurs or gallops, normal peripheral perfusion Neuro:  Normal mental status, speech normal, alert and oriented x3 Musculoskeletal:  no cyanosis, clubbing or edema Skin:  skin color, texture and turgor are normal; no bruising, rashes or lesions noted. Psych: Normal Affect and mood   Diagnostic studies:    Spirometry: results abnormal (FEV1: 1.18/36%, FVC: 2.04/46%, FEV1/FVC: 58%).    Spirometry consistent with mixed obstructive and restrictive disease.   Allergy Studies: none       Salvatore Marvel, MD  [facility]

## 2020-04-04 NOTE — Patient Instructions (Addendum)
1. Asthma-COPD overlap syndrome - Lung testing looked stable. - Information on Fasenra provided.  - Consent form provided.  - Daily controller medication(s): DuoNeb treatments ONCE daily with an Acapella device daily + Spiriva 2.50mcg one puff once daily  - Prior to physical activity: albuterol 2 puffs 10-15 minutes before physical activity. - Rescue medications: albuterol 4 puffs every 4-6 hours as needed - Asthma control goals:  * Full participation in all desired activities (may need albuterol before activity) * Albuterol use two time or less a week on average (not counting use with activity) * Cough interfering with sleep two time or less a month * Oral steroids no more than once a year * No hospitalizations  2. Seasonal and perennial allergic rhinitis (grasses, weeds, trees, indoor molds, outdoor molds, dust mites, dog and cockroach) - Continue with allergy shots at the same schedule.  - Continue with Benadryl as needed for postnasal drip.   3. GERD - Continue with omeprazole 40mg  once at night.   4. Return in about 3 months (around 07/05/2020). This can be an in-person, a virtual Webex or a telephone follow up visit.   Please inform us of any Emergency Department visits, hospitalizations, or changes in symptoms. Call us before going to the ED for breathing or allergy symptoms since we might be able to fit you in for a sick visit. Feel free to contact us anytime with any questions, problems, or concerns.  It was a pleasure to see you again today!  Websites that have reliable patient information: 1. American Academy of Asthma, Allergy, and Immunology: www.aaaai.org 2. Food Allergy Research and Education (FARE): foodallergy.org 3. Mothers of Asthmatics: http://www.asthmacommunitynetwork.org 4. American College of Allergy, Asthma, and Immunology: www.acaai.org   COVID-19 Vaccine Information can be found at:  ShippingScam.co.uk For questions related to vaccine distribution or appointments, please email vaccine@[email or web address] [email or web address] or call (956)833-1823.     "Like" [place] on Facebook and Instagram for our latest updates!       HAPPY SPRING!  Make sure you are registered to vote! If you have moved or changed any of your contact information, you will need to get this updated before voting!  In some cases, you MAY be able to register to vote online: [email or web address]

## 2020-04-06 DIAGNOSIS — J4522 Mild intermittent asthma with status asthmaticus: Secondary | ICD-10-CM | POA: Diagnosis not present

## 2020-04-08 ENCOUNTER — Telehealth: Payer: Self-pay | Admitting: "Endocrinology

## 2020-04-08 NOTE — Telephone Encounter (Signed)
That is Ok

## 2020-04-08 NOTE — Telephone Encounter (Signed)
Pt wants you to know that he will not be able to do his thyroid scan until he gets wifes paid off.

## 2020-04-15 ENCOUNTER — Other Ambulatory Visit (HOSPITAL_COMMUNITY): Payer: Self-pay | Admitting: Psychiatry

## 2020-04-15 ENCOUNTER — Telehealth (HOSPITAL_COMMUNITY): Payer: Self-pay | Admitting: *Deleted

## 2020-04-15 MED ORDER — DOXEPIN HCL 75 MG PO CAPS: 75.0000 mg | ORAL_CAPSULE | Freq: Every day | ORAL | 0 refills | Status: DC

## 2020-04-15 NOTE — Telephone Encounter (Signed)
Ordered

## 2020-04-15 NOTE — Telephone Encounter (Signed)
Patient called stating he is needing refills for his Doxepin. Informed patient that office already sent refills for his medication to his pharmacy [facility]. Patient called them and called office back stating that they informed him that the 75 mg was not sent. Staff called pharmacy and spoke with Vaughan Basta and informed him that the 75mg  was sent and [facility] informed staff they never did receive it.   Patient is needing his Doxepin 75 mg sent to [facility]

## 2020-04-15 NOTE — Telephone Encounter (Signed)
Patient called stating he is needing refills for his Doxepin. Informed patient that office already sent refills for his medication to his pharmacy [facility]. Patient called them and called office back stating that they informed him that the 75 mg was not sent. Staff called pharmacy and spoke with Vaughan Basta and informed him that the 75mg  was sent and [facility] informed staff they never did receive it.   Patient is needing his Doxepin 75 mg sent to [facility]

## 2020-04-15 NOTE — Telephone Encounter (Signed)
Dr. Hisada's pt 

## 2020-04-15 NOTE — Telephone Encounter (Signed)
The pharmacy said although out computer is saying confirmed they do not have the script. They want provider to please resend the script again for patient.

## 2020-04-16 ENCOUNTER — Other Ambulatory Visit (HOSPITAL_COMMUNITY): Payer: PPO

## 2020-04-16 NOTE — Telephone Encounter (Signed)
Noted  

## 2020-04-17 ENCOUNTER — Other Ambulatory Visit (HOSPITAL_COMMUNITY): Payer: PPO

## 2020-04-18 ENCOUNTER — Other Ambulatory Visit (HOSPITAL_COMMUNITY): Payer: PPO

## 2020-04-21 ENCOUNTER — Other Ambulatory Visit (HOSPITAL_COMMUNITY): Payer: PPO

## 2020-04-23 ENCOUNTER — Ambulatory Visit (INDEPENDENT_AMBULATORY_CARE_PROVIDER_SITE_OTHER): Payer: PPO

## 2020-04-23 ENCOUNTER — Ambulatory Visit: Payer: PPO

## 2020-04-23 DIAGNOSIS — J4550 Severe persistent asthma, uncomplicated: Secondary | ICD-10-CM | POA: Diagnosis not present

## 2020-04-23 DIAGNOSIS — J309 Allergic rhinitis, unspecified: Secondary | ICD-10-CM

## 2020-04-24 MED ORDER — BENRALIZUMAB 30 MG/ML ~~LOC~~ SOSY
30.0000 mg | PREFILLED_SYRINGE | SUBCUTANEOUS | Status: AC
  Administered 2020-04-23 – 2020-06-25 (×3): 30 mg via SUBCUTANEOUS

## 2020-04-29 DIAGNOSIS — F419 Anxiety disorder, unspecified: Secondary | ICD-10-CM | POA: Diagnosis not present

## 2020-04-29 DIAGNOSIS — J309 Allergic rhinitis, unspecified: Secondary | ICD-10-CM | POA: Diagnosis not present

## 2020-04-29 DIAGNOSIS — Z713 Dietary counseling and surveillance: Secondary | ICD-10-CM | POA: Diagnosis not present

## 2020-04-29 DIAGNOSIS — Z6833 Body mass index (BMI) 33.0-33.9, adult: Secondary | ICD-10-CM | POA: Diagnosis not present

## 2020-04-29 DIAGNOSIS — I10 Essential (primary) hypertension: Secondary | ICD-10-CM | POA: Diagnosis not present

## 2020-04-29 DIAGNOSIS — J45909 Unspecified asthma, uncomplicated: Secondary | ICD-10-CM | POA: Diagnosis not present

## 2020-04-30 ENCOUNTER — Other Ambulatory Visit: Payer: Self-pay | Admitting: "Endocrinology

## 2020-05-06 DIAGNOSIS — J4522 Mild intermittent asthma with status asthmaticus: Secondary | ICD-10-CM | POA: Diagnosis not present

## 2020-05-07 ENCOUNTER — Ambulatory Visit (INDEPENDENT_AMBULATORY_CARE_PROVIDER_SITE_OTHER): Payer: PPO

## 2020-05-07 ENCOUNTER — Ambulatory Visit: Payer: Self-pay

## 2020-05-07 DIAGNOSIS — J309 Allergic rhinitis, unspecified: Secondary | ICD-10-CM | POA: Diagnosis not present

## 2020-05-15 NOTE — Progress Notes (Signed)
Virtual Visit via Telephone Note  I connected with Jose Burch on 05/21/20 at 10:00 AM EDT by telephone and verified that I am speaking with the correct person using two identifiers.   I discussed the limitations, risks, security and privacy concerns of performing an evaluation and management service by telephone and the availability of in person appointments. I also discussed with the patient that there may be a patient responsible charge related to this service. The patient expressed understanding and agreed to proceed.    I discussed the assessment and treatment plan with the patient. The patient was provided an opportunity to ask questions and all were answered. The patient agreed with the plan and demonstrated an understanding of the instructions.   The patient was advised to call back or seek an in-person evaluation if the symptoms worsen or if the condition fails to improve as anticipated.  Location: patient- home, provider- office   I provided 12 minutes of non-face-to-face time during this encounter.   Jose Clay, MD    [facility] MD/PA/NP OP Progress Note  05/21/2020 10:25 AM Jose Burch  MRN:  [id number]  Chief Complaint:  Chief Complaint    Depression; Follow-up     HPI:  This is a follow-up appointment for depression and insomnia.  He states that it is depressing to see worsening in pandemic.  He will admit watching TV, as it makes him feel down.  He enjoys reading books about Architect.  His parents used to do those.  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  He reports his frustration of not being able to do things compared to before due to his age and physical condition.  He is getting monthly injection for allergy.  He sleeps 3 to 4 hours.  He feels depressed at times.  He has good appetite.  He has fair energy and motivation.  He denies SI.  He feels anxious and tense at times, although he tries not to dwell on things.  He denies panic attacks.  He  does not have any concerns about his medication.    Visit Diagnosis:    ICD-10-CM   1. Insomnia, unspecified type  G47.00   2. MDD (major depressive disorder), recurrent episode, mild ([place])  F33.0     Past Psychiatric History: Please see initial evaluation for full details. I have reviewed the history. No updates at this time.     Past Medical History:  Past Medical History:  Diagnosis Date  . Asthma   . Cancer ([place])    Thyroid  . COPD (chronic obstructive pulmonary disease) ([place])   . Hypertension   . Insomnia   . Thyroid disease   . Tobacco abuse     Past Surgical History:  Procedure Laterality Date  . skin cancer removed    . THYROIDECTOMY  2015    Family Psychiatric History: Please see initial evaluation for full details. I have reviewed the history. No updates at this time.     Family History:  Family History  Problem Relation Age of Onset  . Cancer Mother   . Asthma Mother   . Heart attack Sister   . Allergic rhinitis Neg Hx   . Eczema Neg Hx     Social History:  Social History   Socioeconomic History  . Marital status: Married    Spouse name: Not on file  . Number of children: Not on file  . Years of education: Not on file  . Highest education  level: Not on file  Occupational History  . Not on file  Tobacco Use  . Smoking status: Current Every Day Smoker    Packs/day: 0.50    Types: Cigarettes  . Smokeless tobacco: Never Used  Vaping Use  . Vaping Use: Never used  Substance and Sexual Activity  . Alcohol use: No  . Drug use: No  . Sexual activity: Never    Partners: Female  Other Topics Concern  . Not on file  Social History Narrative  . Not on file   Social Determinants of Health   Financial Resource Strain:   . Difficulty of Paying Living Expenses:   Food Insecurity:   . Worried About Charity fundraiser in the Last Year:   . Arboriculturist in the Last Year:   Transportation Needs:   . Film/video editor (Medical):   Marland Kitchen  Lack of Transportation (Non-Medical):   Physical Activity:   . Days of Exercise per Week:   . Minutes of Exercise per Session:   Stress:   . Feeling of Stress :   Social Connections:   . Frequency of Communication with Friends and Family:   . Frequency of Social Gatherings with Friends and Family:   . Attends Religious Services:   . Active Member of Clubs or Organizations:   . Attends Archivist Meetings:   Marland Kitchen Marital Status:     Allergies:  Allergies  Allergen Reactions  . Aspirin Hives    Metabolic Disorder Labs: No results found for: HGBA1C, MPG No results found for: PROLACTIN No results found for: CHOL, TRIG, HDL, CHOLHDL, VLDL, LDLCALC Lab Results  Component Value Date   TSH 5.14 (H) 03/27/2020   TSH 0.158 (L) 05/05/2019    Therapeutic Level Labs: No results found for: LITHIUM No results found for: VALPROATE No components found for:  CBMZ  Current Medications: Current Outpatient Medications  Medication Sig Dispense Refill  . albuterol (VENTOLIN HFA) 108 (90 Base) MCG/ACT inhaler Inhale 2 puffs into the lungs every 6 (six) hours as needed for wheezing or shortness of breath. 18 g 1  . amLODipine (NORVASC) 10 MG tablet Take 10 mg by mouth daily.    . calcitRIOL (ROCALTROL) 0.5 MCG capsule TAKE 1 CAPSULE (0.5 MCG TOTAL) BY MOUTH DAILY. 90 capsule 0  . calcium carbonate (TUMS EX) 750 MG chewable tablet Chew 1 tablet (750 mg total) by mouth 3 (three) times daily with meals. 180 tablet 3  . diphenhydrAMINE (BENADRYL) 25 mg capsule Take 25 mg by mouth daily.    Derrill Memo ON 07/14/2020] doxepin (SINEQUAN) 75 MG capsule Take 1 capsule (75 mg total) by mouth at bedtime. 90 capsule 0  . ipratropium-albuterol (DUONEB) 0.5-2.5 (3) MG/3ML SOLN Take 3 mLs by nebulization 2 (two) times daily. 180 mL 5  . levothyroxine (SYNTHROID) 175 MCG tablet Take 1 tablet (175 mcg total) by mouth daily before breakfast. 90 tablet 1  . levothyroxine (SYNTHROID) 175 MCG tablet Take 1  tablet (175 mcg total) by mouth daily before breakfast. 90 tablet 1  . LORazepam (ATIVAN) 2 MG tablet Take 2 mg by mouth 2 (two) times daily as needed.    Marland Kitchen losartan (COZAAR) 100 MG tablet Take 1 tablet (100 mg total) by mouth daily. 90 tablet 3  . potassium chloride SA (KLOR-CON) 20 MEQ tablet Take 1 tablet (20 mEq total) by mouth daily. 90 tablet 3  . Tiotropium Bromide Monohydrate (SPIRIVA RESPIMAT) 2.5 MCG/ACT AERS Inhale 1 spray into the  lungs daily. 12 g 0  . UNKNOWN TO PATIENT Allergy injection every 2 weeks     Current Facility-Administered Medications  Medication Dose Route Frequency Provider Last Rate Last Admin  . Benralizumab SOSY 30 mg  30 mg Subcutaneous Q28 days Valentina Shaggy, MD   30 mg at 04/23/20 2500     Musculoskeletal: Strength & Muscle Tone: N/A Gait & Station: N/A Patient leans: N/A  Psychiatric Specialty Exam: Review of Systems  Psychiatric/Behavioral: Positive for dysphoric mood and sleep disturbance. Negative for agitation, behavioral problems, confusion, decreased concentration, hallucinations, self-injury and suicidal ideas. The patient is nervous/anxious. The patient is not hyperactive.   All other systems reviewed and are negative.   There were no vitals taken for this visit.There is no height or weight on file to calculate BMI.  General Appearance: NA  Eye Contact:  NA  Speech:  Clear and Coherent  Volume:  Normal  Mood:  good  Affect:  NA  Thought Process:  Coherent  Orientation:  Full (Time, Place, and Person)  Thought Content: Logical   Suicidal Thoughts:  No  Homicidal Thoughts:  No  Memory:  Immediate;   Good  Judgement:  Good  Insight:  Fair  Psychomotor Activity:  Normal  Concentration:  Concentration: Good and Attention Span: Good  Recall:  Good  Fund of Knowledge: Good  Language: Good  Akathisia:  No  Handed:  Right  AIMS (if indicated): not done  Assets:  Communication Skills Desire for Improvement  ADL's:  Intact   Cognition: WNL  Sleep:  Poor   Screenings: PHQ2-9     Office Visit from 02/13/2018 in [facility] Office Visit from 08/15/2017 in [facility] Office Visit from 02/10/2017 in [facility] Office Visit from 01/13/2017 in [facility]  PHQ-2 Total Score 0 0 0 0       Assessment and Plan:  BOLIVAR KORANDA is a 69 y.o. year old male with a history of  depression, insomnia,malignant neoplasm of thyroid gland s/pthyroidectomyin2015. ablation , postsurgical hypothyroidism,hypocalcemia,COPD, who presents for follow up appointment for below.   1. Insomnia, unspecified type 2. MDD (major depressive disorder), recurrent episode, mild ([place]) Although he continues to report occasional depressive symptoms and anxiety, it has been self-limited, and he handle things relatively well.  Psychosocial stressors includes demoralization due to his medical condition, retirement in 2016, and pandemic.  Noted that although he used to be on Lexapro, he discontinued this medication due to perceived side effect of nightmares (although he attributed it to mirtazapine when he had that symptoms).  Will continue doxepin only at this time to target both depression and insomnia.  Discussed potential risks, just includes but not limited to palpitation and drowsiness.   # Insomnia R/o sleep apnea He does have snoring, daytime fatigue and middle insomnia.  Although he will greatly benefit from sleep evaluation, he declined it at this time as he has many other appointments to pursue. Will continue to discuss as needed.   Plan I have reviewed and updated plans as below 1. Continuedoxepin75mg  at night  2.Next appointment:11/24 at 9:10 for 20 mins, video [email or web address] if text does not work - onlorazepam 2 mg BID by primary care doctor - he is not interested in therapy at this time  Past trials of  medication:sinequan(doxepin), fluoxetine,lexapro (perceived side effect of nightmares), mirtazapine (nightmares),bupropion, Ambien, Trazodone,  The patient demonstrates the following risk factors for suicide: Chronic risk factors for suicide include:psychiatric disorder ofdepressionand medical illness  of COPD,thyroidcancer. Acute risk factorsfor suicide include: unemployment and loss (financial, interpersonal, professional). Protective factorsfor this patient include: positive social support, coping skills and hope for the future. Considering these factors, the overall suicide risk at this point appears to below. Patientisappropriate for outpatient follow up.  Jose Clay, MD 05/21/2020, 10:25 AM

## 2020-05-21 ENCOUNTER — Encounter (HOSPITAL_COMMUNITY): Payer: Self-pay | Admitting: Psychiatry

## 2020-05-21 ENCOUNTER — Telehealth (INDEPENDENT_AMBULATORY_CARE_PROVIDER_SITE_OTHER): Payer: PPO | Admitting: Psychiatry

## 2020-05-21 ENCOUNTER — Other Ambulatory Visit: Payer: Self-pay

## 2020-05-21 ENCOUNTER — Ambulatory Visit: Payer: Self-pay

## 2020-05-21 DIAGNOSIS — G4700 Insomnia, unspecified: Secondary | ICD-10-CM

## 2020-05-21 DIAGNOSIS — F330 Major depressive disorder, recurrent, mild: Secondary | ICD-10-CM | POA: Diagnosis not present

## 2020-05-21 MED ORDER — DOXEPIN HCL 75 MG PO CAPS: 75.0000 mg | ORAL_CAPSULE | Freq: Every day | ORAL | 0 refills | Status: DC

## 2020-05-21 NOTE — Patient Instructions (Signed)
1. Continuedoxepin75mg  at night  2.Next appointment:11/24 at 9:10

## 2020-05-23 ENCOUNTER — Ambulatory Visit (INDEPENDENT_AMBULATORY_CARE_PROVIDER_SITE_OTHER): Payer: PPO

## 2020-05-23 ENCOUNTER — Ambulatory Visit: Payer: Self-pay

## 2020-05-23 DIAGNOSIS — J4550 Severe persistent asthma, uncomplicated: Secondary | ICD-10-CM | POA: Diagnosis not present

## 2020-05-23 DIAGNOSIS — J309 Allergic rhinitis, unspecified: Secondary | ICD-10-CM

## 2020-06-06 DIAGNOSIS — J4522 Mild intermittent asthma with status asthmaticus: Secondary | ICD-10-CM | POA: Diagnosis not present

## 2020-06-20 ENCOUNTER — Ambulatory Visit: Payer: Self-pay

## 2020-06-25 ENCOUNTER — Ambulatory Visit (INDEPENDENT_AMBULATORY_CARE_PROVIDER_SITE_OTHER): Payer: PPO

## 2020-06-25 ENCOUNTER — Other Ambulatory Visit: Payer: Self-pay

## 2020-06-25 DIAGNOSIS — J4550 Severe persistent asthma, uncomplicated: Secondary | ICD-10-CM | POA: Diagnosis not present

## 2020-06-25 DIAGNOSIS — J309 Allergic rhinitis, unspecified: Secondary | ICD-10-CM

## 2020-06-30 DIAGNOSIS — J3081 Allergic rhinitis due to animal (cat) (dog) hair and dander: Secondary | ICD-10-CM | POA: Diagnosis not present

## 2020-06-30 NOTE — Progress Notes (Signed)
VIALS EXP 06-30-21

## 2020-07-01 DIAGNOSIS — J3089 Other allergic rhinitis: Secondary | ICD-10-CM | POA: Diagnosis not present

## 2020-07-02 ENCOUNTER — Ambulatory Visit (INDEPENDENT_AMBULATORY_CARE_PROVIDER_SITE_OTHER): Payer: PPO

## 2020-07-02 ENCOUNTER — Ambulatory Visit: Payer: PPO | Admitting: Allergy & Immunology

## 2020-07-02 ENCOUNTER — Other Ambulatory Visit: Payer: Self-pay

## 2020-07-02 DIAGNOSIS — J309 Allergic rhinitis, unspecified: Secondary | ICD-10-CM

## 2020-07-03 ENCOUNTER — Telehealth: Payer: Self-pay

## 2020-07-03 MED ORDER — PREDNISONE 20 MG PO TABS: 20.0000 mg | ORAL_TABLET | Freq: Two times a day (BID) | ORAL | 0 refills | Status: AC

## 2020-07-03 NOTE — Telephone Encounter (Signed)
Patient called stating he got his allergy shot yesterday and today he's experiencing swelling/tingling in his legs, feet, hands, and little bit of facial swelling. I asked if he was having any shortness of breath and he stated a little bit but he has asthma. I advised patient to go ahead take 2 puffs of his albuterol and 2 benadryl.  I did speak with Dr. Ernst Bowler and he advise patient to take 2 more benadryl tonight and prednisone 40mg  daily for 3 days and to give [place] a call in the morning with an update. I called and informed patient and he expressed understanding.   Prednisone sent.

## 2020-07-07 DIAGNOSIS — J4522 Mild intermittent asthma with status asthmaticus: Secondary | ICD-10-CM | POA: Diagnosis not present

## 2020-07-09 ENCOUNTER — Ambulatory Visit: Payer: PPO | Admitting: Allergy & Immunology

## 2020-07-27 ENCOUNTER — Other Ambulatory Visit: Payer: Self-pay | Admitting: "Endocrinology

## 2020-07-30 ENCOUNTER — Other Ambulatory Visit: Payer: Self-pay | Admitting: "Endocrinology

## 2020-07-30 DIAGNOSIS — J309 Allergic rhinitis, unspecified: Secondary | ICD-10-CM | POA: Diagnosis not present

## 2020-07-30 DIAGNOSIS — F419 Anxiety disorder, unspecified: Secondary | ICD-10-CM | POA: Diagnosis not present

## 2020-07-30 DIAGNOSIS — Z713 Dietary counseling and surveillance: Secondary | ICD-10-CM | POA: Diagnosis not present

## 2020-07-30 DIAGNOSIS — I10 Essential (primary) hypertension: Secondary | ICD-10-CM | POA: Diagnosis not present

## 2020-07-30 DIAGNOSIS — Z6832 Body mass index (BMI) 32.0-32.9, adult: Secondary | ICD-10-CM | POA: Diagnosis not present

## 2020-08-06 DIAGNOSIS — J4522 Mild intermittent asthma with status asthmaticus: Secondary | ICD-10-CM | POA: Diagnosis not present

## 2020-08-06 DIAGNOSIS — R509 Fever, unspecified: Secondary | ICD-10-CM | POA: Diagnosis not present

## 2020-08-06 DIAGNOSIS — R059 Cough, unspecified: Secondary | ICD-10-CM | POA: Diagnosis not present

## 2020-08-06 DIAGNOSIS — R519 Headache, unspecified: Secondary | ICD-10-CM | POA: Diagnosis not present

## 2020-08-20 ENCOUNTER — Ambulatory Visit: Payer: PPO | Admitting: Allergy & Immunology

## 2020-08-20 ENCOUNTER — Ambulatory Visit: Payer: Self-pay

## 2020-08-27 NOTE — Progress Notes (Deleted)
[facility] MD/PA/NP OP Progress Note  08/27/2020 1:31 PM Jose Burch  MRN:  [id number]  Chief Complaint:  HPI: *** Visit Diagnosis: No diagnosis found.  Past Psychiatric History: Please see initial evaluation for full details. I have reviewed the history. No updates at this time.     Past Medical History:  Past Medical History:  Diagnosis Date  . Asthma   . Cancer ([place])    Thyroid  . COPD (chronic obstructive pulmonary disease) ([place])   . Hypertension   . Insomnia   . Thyroid disease   . Tobacco abuse     Past Surgical History:  Procedure Laterality Date  . skin cancer removed    . THYROIDECTOMY  2015    Family Psychiatric History: Please see initial evaluation for full details. I have reviewed the history. No updates at this time.     Family History:  Family History  Problem Relation Age of Onset  . Cancer Mother   . Asthma Mother   . Heart attack Sister   . Allergic rhinitis Neg Hx   . Eczema Neg Hx     Social History:  Social History   Socioeconomic History  . Marital status: Married    Spouse name: Not on file  . Number of children: Not on file  . Years of education: Not on file  . Highest education level: Not on file  Occupational History  . Not on file  Tobacco Use  . Smoking status: Current Every Day Smoker    Packs/day: 0.50    Types: Cigarettes  . Smokeless tobacco: Never Used  Vaping Use  . Vaping Use: Never used  Substance and Sexual Activity  . Alcohol use: No  . Drug use: No  . Sexual activity: Never    Partners: Female  Other Topics Concern  . Not on file  Social History Narrative  . Not on file   Social Determinants of Health   Financial Resource Strain:   . Difficulty of Paying Living Expenses: Not on file  Food Insecurity:   . Worried About Charity fundraiser in the Last Year: Not on file  . Ran Out of Food in the Last Year: Not on file  Transportation Needs:   . Lack of Transportation (Medical): Not on file  . Lack of  Transportation (Non-Medical): Not on file  Physical Activity:   . Days of Exercise per Week: Not on file  . Minutes of Exercise per Session: Not on file  Stress:   . Feeling of Stress : Not on file  Social Connections:   . Frequency of Communication with Friends and Family: Not on file  . Frequency of Social Gatherings with Friends and Family: Not on file  . Attends Religious Services: Not on file  . Active Member of Clubs or Organizations: Not on file  . Attends Archivist Meetings: Not on file  . Marital Status: Not on file    Allergies:  Allergies  Allergen Reactions  . Aspirin Hives    Metabolic Disorder Labs: No results found for: HGBA1C, MPG No results found for: PROLACTIN No results found for: CHOL, TRIG, HDL, CHOLHDL, VLDL, LDLCALC Lab Results  Component Value Date   TSH 5.14 (H) 03/27/2020   TSH 0.158 (L) 05/05/2019    Therapeutic Level Labs: No results found for: LITHIUM No results found for: VALPROATE No components found for:  CBMZ  Current Medications: Current Outpatient Medications  Medication Sig Dispense Refill  . albuterol (VENTOLIN HFA)  108 (90 Base) MCG/ACT inhaler Inhale 2 puffs into the lungs every 6 (six) hours as needed for wheezing or shortness of breath. 18 g 1  . amLODipine (NORVASC) 10 MG tablet Take 10 mg by mouth daily.    . calcitRIOL (ROCALTROL) 0.5 MCG capsule TAKE 1 CAPSULE (0.5 MCG TOTAL) BY MOUTH DAILY. 90 capsule 0  . calcium carbonate (TUMS EX) 750 MG chewable tablet Chew 1 tablet (750 mg total) by mouth 3 (three) times daily with meals. 180 tablet 3  . diphenhydrAMINE (BENADRYL) 25 mg capsule Take 25 mg by mouth daily.    Marland Kitchen doxepin (SINEQUAN) 75 MG capsule Take 1 capsule (75 mg total) by mouth at bedtime. 90 capsule 0  . ipratropium-albuterol (DUONEB) 0.5-2.5 (3) MG/3ML SOLN Take 3 mLs by nebulization 2 (two) times daily. 180 mL 5  . levothyroxine (SYNTHROID) 175 MCG tablet Take 1 tablet (175 mcg total) by mouth daily before  breakfast. 90 tablet 1  . levothyroxine (SYNTHROID) 175 MCG tablet Take 1 tablet (175 mcg total) by mouth daily before breakfast. 90 tablet 1  . LORazepam (ATIVAN) 2 MG tablet Take 2 mg by mouth 2 (two) times daily as needed.    Marland Kitchen losartan (COZAAR) 100 MG tablet Take 1 tablet (100 mg total) by mouth daily. 90 tablet 3  . potassium chloride SA (KLOR-CON) 20 MEQ tablet Take 1 tablet (20 mEq total) by mouth daily. 90 tablet 3  . predniSONE (DELTASONE) 20 MG tablet Take 1 tablet (20 mg total) by mouth 2 (two) times daily with a meal. 6 tablet 0  . Tiotropium Bromide Monohydrate (SPIRIVA RESPIMAT) 2.5 MCG/ACT AERS Inhale 1 spray into the lungs daily. 12 g 0  . UNKNOWN TO PATIENT Allergy injection every 2 weeks     Current Facility-Administered Medications  Medication Dose Route Frequency Provider Last Rate Last Admin  . Benralizumab SOSY 30 mg  30 mg Subcutaneous Q28 days Valentina Shaggy, MD   30 mg at 06/25/20 1041     Musculoskeletal: Strength & Muscle Tone: N/A Gait & Station: N/A Patient leans: N/A  Psychiatric Specialty Exam: Review of Systems  There were no vitals taken for this visit.There is no height or weight on file to calculate BMI.  General Appearance: {Appearance:22683}  Eye Contact:  {BHH EYE CONTACT:22684}  Speech:  Clear and Coherent  Volume:  Normal  Mood:  {BHH MOOD:22306}  Affect:  {Affect (PAA):22687}  Thought Process:  Coherent  Orientation:  Full (Time, Place, and Person)  Thought Content: Logical   Suicidal Thoughts:  {ST/HT (PAA):22692}  Homicidal Thoughts:  {ST/HT (PAA):22692}  Memory:  Immediate;   Good  Judgement:  {Judgement (PAA):22694}  Insight:  {Insight (PAA):22695}  Psychomotor Activity:  Normal  Concentration:  Concentration: Good and Attention Span: Good  Recall:  Good  Fund of Knowledge: Good  Language: Good  Akathisia:  No  Handed:  Right  AIMS (if indicated): not done  Assets:  Communication Skills Desire for Improvement  ADL's:   Intact  Cognition: WNL  Sleep:  {BHH GOOD/FAIR/POOR:22877}   Screenings: PHQ2-9     Office Visit from 02/13/2018 in [facility] Office Visit from 08/15/2017 in [facility] Office Visit from 02/10/2017 in [facility] Office Visit from 01/13/2017 in [facility]  PHQ-2 Total Score 0 0 0 0       Assessment and Plan:  Jose Burch is a 69 y.o. year old male with a history of depression, insomnia,malignant neoplasm of thyroid gland  s/pthyroidectomyin2015. ablation , postsurgical hypothyroidism,hypocalcemia,COPD, who presents for follow up appointment for below.    1. Insomnia, unspecified type 2. MDD (major depressive disorder), recurrent episode, mild ([place]) Although he continues to report occasional depressive symptoms and anxiety, it has been self-limited, and he handle things relatively well.  Psychosocial stressors includes demoralization due to his medical condition, retirement in 2016, and pandemic.  Noted that although he used to be on Lexapro, he discontinued this medication due to perceived side effect of nightmares (although he attributed it to mirtazapine when he had that symptoms).  Will continue doxepin only at this time to target both depression and insomnia.  Discussed potential risks, just includes but not limited to palpitation and drowsiness.   # Insomnia R/o sleep apnea He does have snoring,daytime fatigue and middle insomnia.Although he will greatly benefit from sleep evaluation,he declined it at this timeas he has many other appointments to pursue. Will continue to discuss as needed.  Plan  1.Continuedoxepin75mg  at night 2.Next appointment:11/24 at 9:19for 20 mins, video [email or web address] if text does not work - onlorazepam 2 mg BID by primary care doctor - heis not interested in therapy at this time  Past trials of medication:sinequan(doxepin),  fluoxetine,lexapro (perceived side effect of nightmares), mirtazapine (nightmares),bupropion, Ambien, Trazodone,  The patient demonstrates the following risk factors for suicide: Chronic risk factors for suicide include:psychiatric disorder ofdepressionand medical illness of COPD,thyroidcancer. Acute risk factorsfor suicide include: unemployment and loss (financial, interpersonal, professional). Protective factorsfor this patient include: positive social support, coping skills and hope for the future. Considering these factors, the overall suicide risk at this point appears to below. Patientisappropriate for outpatient follow up.  [name], MD 08/27/2020, 1:31 PM

## 2020-09-02 ENCOUNTER — Telehealth: Payer: Self-pay | Admitting: *Deleted

## 2020-09-02 NOTE — Telephone Encounter (Signed)
Called patient to inquire if he had received letter from [facility] regarding re-enroll for [facility] and he advised no longer on taking same because it made him sick. I went back and looked at notes and couldn't really find that Berna Bue was issue but maybe allergy injections caused reaction and he decided to d/c both.  He has received his 3 loading of Berna Bue and would be due Nov for his 8 week dose if he wants to continue but I believe he is confused. FYI

## 2020-09-03 ENCOUNTER — Telehealth (HOSPITAL_COMMUNITY): Payer: PPO | Admitting: Psychiatry

## 2020-09-03 ENCOUNTER — Telehealth: Payer: PPO | Admitting: Psychiatry

## 2020-09-06 DIAGNOSIS — J4522 Mild intermittent asthma with status asthmaticus: Secondary | ICD-10-CM | POA: Diagnosis not present

## 2020-09-08 ENCOUNTER — Encounter: Payer: Self-pay | Admitting: Psychiatry

## 2020-09-08 ENCOUNTER — Telehealth (INDEPENDENT_AMBULATORY_CARE_PROVIDER_SITE_OTHER): Payer: PPO | Admitting: Psychiatry

## 2020-09-08 ENCOUNTER — Other Ambulatory Visit: Payer: Self-pay

## 2020-09-08 DIAGNOSIS — G4700 Insomnia, unspecified: Secondary | ICD-10-CM | POA: Diagnosis not present

## 2020-09-08 DIAGNOSIS — F330 Major depressive disorder, recurrent, mild: Secondary | ICD-10-CM

## 2020-09-08 MED ORDER — DOXEPIN HCL 50 MG PO CAPS: 50.0000 mg | ORAL_CAPSULE | Freq: Every day | ORAL | 0 refills | Status: DC

## 2020-09-08 MED ORDER — SERTRALINE HCL 25 MG PO TABS
25.0000 mg | ORAL_TABLET | Freq: Every day | ORAL | 1 refills | Status: DC
Start: 2020-09-08 — End: 2021-06-08

## 2020-09-08 NOTE — Telephone Encounter (Signed)
Let's reach out to him to figure this out. Nursing crew - can someone call to clarify? The last telephone note I have is from a slight reaction to an allergy shot.   I would rather him stay on the [place] since he cannot tolerate any of the controller medications for his breathing, or at least very few of them.  Salvatore Marvel, MD [facility]

## 2020-09-08 NOTE — Patient Instructions (Signed)
1.Decreasedoxepin50mg  at night 2. Start sertraline 25 mg daily  3. Next appointment:1/3 at 9:30

## 2020-09-08 NOTE — Progress Notes (Addendum)
Virtual Visit via Telephone Note  I connected with Jose Burch on 09/08/20 at  9:30 AM EST by telephone and verified that I am speaking with the correct person using two identifiers.  Location: Patient: home Provider: office Persons participated in the visit- patient, provider   I discussed the limitations, risks, security and privacy concerns of performing an evaluation and management service by telephone and the availability of in person appointments. I also discussed with the patient that there may be a patient responsible charge related to this service. The patient expressed understanding and agreed to proceed.   I discussed the assessment and treatment plan with the patient. The patient was provided an opportunity to ask questions and all were answered. The patient agreed with the plan and demonstrated an understanding of the instructions.   The patient was advised to call back or seek an in-person evaluation if the symptoms worsen or if the condition fails to improve as anticipated.  I provided 14 minutes of non-face-to-face time during this encounter.   [name], MD    [facility] MD/PA/NP OP Progress Note  09/08/2020 9:40 AM Jose Burch  MRN:  [id number]  Chief Complaint:  Chief Complaint    Depression; Follow-up     HPI:  This is a follow-up appointment for insomnia and depression.  He states that he is not doing well.  His wife has been sick, and he needed to bring her to emergency room.  She reportedly had surgery in bladder, which he attributed to infected colon. He feels that his children are dependent on him to take care of her.  He feels overwhelmed by this situation, although he does what he has to do.  Although he sleeps better with doxepin, he feels "druggy" in the morning.  He has fatigue.  He has fair concentration.  He has fair appetite.  He denies SI.  He feels anxious and tense.  He has occasional panic attacks.   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 Insomnia, unspecified type  G47.00   2. MDD (major depressive disorder), recurrent episode, mild ([place])  F33.0     Past Psychiatric History: Please see initial evaluation for full details. I have reviewed the history. No updates at this time.     Past Medical History:  Past Medical History:  Diagnosis Date  . Asthma   . Cancer ([place])    Thyroid  . COPD (chronic obstructive pulmonary disease) ([place])   . Hypertension   . Insomnia   . Thyroid disease   . Tobacco abuse     Past Surgical History:  Procedure Laterality Date  . skin cancer removed    . THYROIDECTOMY  2015    Family Psychiatric History: Please see initial evaluation for full details. I have reviewed the history. No updates at this time.     Family History:  Family History  Problem Relation Age of Onset  . Cancer Mother   . Asthma Mother   . Heart attack Sister   . Allergic rhinitis Neg Hx   . Eczema Neg  Hx     Social History:  Social History   Socioeconomic History  . Marital status: Married    Spouse name: Not on file  . Number of children: Not on file  . Years of education: Not on file  . Highest education level: Not on file  Occupational History  . Not on file  Tobacco Use  . Smoking status: Current Every Day Smoker    Packs/day: 0.50    Types: Cigarettes  . Smokeless tobacco: Never Used  Vaping Use  . Vaping Use: Never used  Substance and Sexual Activity  . Alcohol use: No  . Drug use: No  . Sexual activity: Never    Partners: Female  Other Topics Concern  . Not on file  Social History Narrative  . Not on  file   Social Determinants of Health   Financial Resource Strain:   . Difficulty of Paying Living Expenses: Not on file  Food Insecurity:   . Worried About Charity fundraiser in the Last Year: Not on file  . Ran Out of Food in the Last Year: Not on file  Transportation Needs:   . Lack of Transportation (Medical): Not on file  . Lack of Transportation (Non-Medical): Not on file  Physical Activity:   . Days of Exercise per Week: Not on file  . Minutes of Exercise per Session: Not on file  Stress:   . Feeling of Stress : Not on file  Social Connections:   . Frequency of Communication with Friends and Family: Not on file  . Frequency of Social Gatherings with Friends and Family: Not on file  . Attends Religious Services: Not on file  . Active Member of Clubs or Organizations: Not on file  . Attends Archivist Meetings: Not on file  . Marital Status: Not on file    Allergies:  Allergies  Allergen Reactions  . Aspirin Hives    Metabolic Disorder Labs: No results found for: HGBA1C, MPG No results found for: PROLACTIN No results found for: CHOL, TRIG, HDL, CHOLHDL, VLDL, LDLCALC Lab Results  Component Value Date   TSH 5.14 (H) 03/27/2020   TSH 0.158 (L) 05/05/2019    Therapeutic Level Labs: No results found for: LITHIUM No results found for: VALPROATE No components found for:  CBMZ  Current Medications: Current Outpatient Medications  Medication Sig Dispense Refill  . albuterol (VENTOLIN HFA) 108 (90 Base) MCG/ACT inhaler Inhale 2 puffs into the lungs every 6 (six) hours as needed for wheezing or shortness of breath. 18 g 1  . amLODipine (NORVASC) 10 MG tablet Take 10 mg by mouth daily.    . calcitRIOL (ROCALTROL) 0.5 MCG capsule TAKE 1 CAPSULE (0.5 MCG TOTAL) BY MOUTH DAILY. 90 capsule 0  . calcium carbonate (TUMS EX) 750 MG chewable tablet Chew 1 tablet (750 mg total) by mouth 3 (three) times daily with meals. 180 tablet 3  . diphenhydrAMINE (BENADRYL) 25 mg  capsule Take 25 mg by mouth daily.    Marland Kitchen doxepin (SINEQUAN) 75 MG capsule Take 1 capsule (75 mg total) by mouth at bedtime. 90 capsule 0  . ipratropium-albuterol (DUONEB) 0.5-2.5 (3) MG/3ML SOLN Take 3 mLs by nebulization 2 (two) times daily. 180 mL 5  . levothyroxine (SYNTHROID) 175 MCG tablet Take 1 tablet (175 mcg total) by mouth daily before breakfast. 90 tablet 1  . levothyroxine (SYNTHROID) 175 MCG tablet Take 1 tablet (175 mcg total) by mouth daily before breakfast. 90 tablet 1  . LORazepam (  ATIVAN) 2 MG tablet Take 2 mg by mouth 2 (two) times daily as needed.    Marland Kitchen losartan (COZAAR) 100 MG tablet Take 1 tablet (100 mg total) by mouth daily. 90 tablet 3  . potassium chloride SA (KLOR-CON) 20 MEQ tablet Take 1 tablet (20 mEq total) by mouth daily. 90 tablet 3  . predniSONE (DELTASONE) 20 MG tablet Take 1 tablet (20 mg total) by mouth 2 (two) times daily with a meal. 6 tablet 0  . Tiotropium Bromide Monohydrate (SPIRIVA RESPIMAT) 2.5 MCG/ACT AERS Inhale 1 spray into the lungs daily. 12 g 0  . UNKNOWN TO PATIENT Allergy injection every 2 weeks     Current Facility-Administered Medications  Medication Dose Route Frequency Provider Last Rate Last Admin  . Benralizumab SOSY 30 mg  30 mg Subcutaneous Q28 days Valentina Shaggy, MD   30 mg at 06/25/20 1041     Musculoskeletal: Strength & Muscle Tone: N/A Gait & Station: N/A Patient leans: N/A  Psychiatric Specialty Exam: Review of Systems  Psychiatric/Behavioral: Positive for dysphoric mood. Negative for agitation, behavioral problems, confusion, decreased concentration, hallucinations, self-injury, sleep disturbance and suicidal ideas. The patient is nervous/anxious. The patient is not hyperactive.   All other systems reviewed and are negative.   There were no vitals taken for this visit.There is no height or weight on file to calculate BMI.  General Appearance: NA  Eye Contact:  NA  Speech:  Clear and Coherent  Volume:  Normal   Mood:  Depressed  Affect:  NA  Thought Process:  Coherent  Orientation:  Full (Time, Place, and Person)  Thought Content: Logical   Suicidal Thoughts:  No  Homicidal Thoughts:  No  Memory:  Immediate;   Good  Judgement:  Good  Insight:  Fair  Psychomotor Activity:  Normal  Concentration:  Concentration: Good and Attention Span: Good  Recall:  Good  Fund of Knowledge: Good  Language: Good  Akathisia:  No  Handed:  Right  AIMS (if indicated): not done  Assets:  Communication Skills Desire for Improvement  ADL's:  Intact  Cognition: WNL  Sleep:  Good   Screenings: PHQ2-9     Office Visit from 02/13/2018 in [facility] Office Visit from 08/15/2017 in [facility] Office Visit from 02/10/2017 in [facility] Office Visit from 01/13/2017 in [facility]  PHQ-2 Total Score 0 0 0 0       Assessment and Plan:  TADAO EMIG is a 69 y.o. year old male with a history of depression, insomnia, malignant neoplasm of thyroid gland s/pthyroidectomyin2015. ablation , postsurgical hypothyroidism,hypocalcemia,COPD, who presents for follow up appointment for below.   1. Insomnia, unspecified type # r/o sleep apnea Although he reports improvement in insomnia, he has daytime drowsiness, which he attributes to doxepin.  We will lower the dose of doxepin to minimize its side effect.  He is aware of its potential risk of palpitation and drowsiness. Noted that he does have snoring, daytime fatigue and middle insomnia. Although he will greatly benefit from sleep evaluation,he declined it at this timeas he has many other appointments to pursue.  2. MDD (major depressive disorder), recurrent episode, mild ([place]) He reports worsening in depressive symptoms in the context of his wife's medical condition.  Other psychosocial stressors includes demoralization due to his medical condition, retirement, and pandemic.   Will start sertraline to target depression and anxiety.  Discussed potential risk of serotonin syndrome with concomitant use of doxepin.  Noted  that he is on lorazepam, prescribed by his PCP.  He is advised to limit its use when he has drowsiness.   Plan I have reviewed and updated plans as below 1.Decreasedoxepin50mg  at night 2. Start sertraline 25 mg daily  3. Next appointment:1/3 at 9:30 for 20 mins, video [email or web address] if text does not work - onlorazepam 2 mg BID by primary care doctor - heis not interested in therapy at this time  Past trials of medication:sinequan(doxepin), fluoxetine,lexapro (perceived side effect of nightmares), mirtazapine (nightmares),bupropion, Ambien, Trazodone,  The patient demonstrates the following risk factors for suicide: Chronic risk factors for suicide include:psychiatric disorder ofdepressionand medical illness of COPD,thyroidcancer. Acute risk factorsfor suicide include: unemployment and loss (financial, interpersonal, professional). Protective factorsfor this patient include: positive social support, coping skills and hope for the future. Considering these factors, the overall suicide risk at this point appears to below. Patientisappropriate for outpatient follow up.  [name], MD 09/08/2020, 9:40 AM

## 2020-09-09 NOTE — Telephone Encounter (Signed)
I called patient to schedule and he refused an appointment.   Dr Ernst Bowler what do you want to do from here?

## 2020-09-09 NOTE — Telephone Encounter (Signed)
Can you schedule him for an in person visit to try to get to the bottom of this.  He is clearly confused.  Salvatore Marvel, MD All[facility] and [place] [facility] [place]

## 2020-09-09 NOTE — Telephone Encounter (Signed)
Called and spoke with the patient and he stated that the [facility] caused him to have tingling in his feet, face, and hands. I do recommend that we have him come in for an office visit because he seemed very confused on what I was asking him and did not seem to fully understanding what I was asking. After several attempts to explain what I was trying to ask that is what he said about the [place].

## 2020-09-11 NOTE — Telephone Encounter (Signed)
I talked to his wife and she tells me that he is breathing fairly well. He has not been having difficulty breathing at all.   She passed to phone to him. He told me that the [place] did not seem to help. He did not feel that it was helping his breathing at all.   He might consider going to see a Pulmonologist.He is going to call us when he makes a decision.   Salvatore Marvel, MD [facility] [place] [facility] [place]

## 2020-09-11 NOTE — Telephone Encounter (Signed)
I attempted to call him myself and he hung up the phone.   Salvatore Marvel, MD Allergy and [place] [facility] [place]

## 2020-09-29 ENCOUNTER — Telehealth: Payer: Self-pay

## 2020-09-29 NOTE — Telephone Encounter (Signed)
Do we have his whole body scan for his appt on 12/27?

## 2020-09-29 NOTE — Telephone Encounter (Signed)
no

## 2020-10-02 NOTE — Progress Notes (Deleted)
[facility] MD/PA/NP OP Progress Note  10/02/2020 9:14 AM Jose Burch  MRN:  [id number]  Chief Complaint:  HPI: *** Visit Diagnosis: No diagnosis found.  Past Psychiatric History: Please see initial evaluation for full details. I have reviewed the history. No updates at this time.     Past Medical History:  Past Medical History:  Diagnosis Date  . Asthma   . Cancer ([place])    Thyroid  . COPD (chronic obstructive pulmonary disease) ([place])   . Hypertension   . Insomnia   . Thyroid disease   . Tobacco abuse     Past Surgical History:  Procedure Laterality Date  . skin cancer removed    . THYROIDECTOMY  2015    Family Psychiatric History: Please see initial evaluation for full details. I have reviewed the history. No updates at this time.     Family History:  Family History  Problem Relation Age of Onset  . Cancer Mother   . Asthma Mother   . Heart attack Sister   . Allergic rhinitis Neg Hx   . Eczema Neg Hx     Social History:  Social History   Socioeconomic History  . Marital status: Married    Spouse name: Not on file  . Number of children: Not on file  . Years of education: Not on file  . Highest education level: Not on file  Occupational History  . Not on file  Tobacco Use  . Smoking status: Current Every Day Smoker    Packs/day: 0.50    Types: Cigarettes  . Smokeless tobacco: Never Used  Vaping Use  . Vaping Use: Never used  Substance and Sexual Activity  . Alcohol use: No  . Drug use: No  . Sexual activity: Never    Partners: Female  Other Topics Concern  . Not on file  Social History Narrative  . Not on file   Social Determinants of Health   Financial Resource Strain: Not on file  Food Insecurity: Not on file  Transportation Needs: Not on file  Physical Activity: Not on file  Stress: Not on file  Social Connections: Not on file    Allergies:  Allergies  Allergen Reactions  . Aspirin Hives    Metabolic Disorder Labs: No results found  for: HGBA1C, MPG No results found for: PROLACTIN No results found for: CHOL, TRIG, HDL, CHOLHDL, VLDL, LDLCALC Lab Results  Component Value Date   TSH 5.14 (H) 03/27/2020   TSH 0.158 (L) 05/05/2019    Therapeutic Level Labs: No results found for: LITHIUM No results found for: VALPROATE No components found for:  CBMZ  Current Medications: Current Outpatient Medications  Medication Sig Dispense Refill  . albuterol (VENTOLIN HFA) 108 (90 Base) MCG/ACT inhaler Inhale 2 puffs into the lungs every 6 (six) hours as needed for wheezing or shortness of breath. 18 g 1  . amLODipine (NORVASC) 10 MG tablet Take 10 mg by mouth daily.    . calcitRIOL (ROCALTROL) 0.5 MCG capsule TAKE 1 CAPSULE (0.5 MCG TOTAL) BY MOUTH DAILY. 90 capsule 0  . calcium carbonate (TUMS EX) 750 MG chewable tablet Chew 1 tablet (750 mg total) by mouth 3 (three) times daily with meals. 180 tablet 3  . diphenhydrAMINE (BENADRYL) 25 mg capsule Take 25 mg by mouth daily.    Marland Kitchen doxepin (SINEQUAN) 50 MG capsule Take 1 capsule (50 mg total) by mouth at bedtime. 90 capsule 0  . ipratropium-albuterol (DUONEB) 0.5-2.5 (3) MG/3ML SOLN Take 3 mLs by  nebulization 2 (two) times daily. 180 mL 5  . levothyroxine (SYNTHROID) 175 MCG tablet Take 1 tablet (175 mcg total) by mouth daily before breakfast. 90 tablet 1  . levothyroxine (SYNTHROID) 175 MCG tablet Take 1 tablet (175 mcg total) by mouth daily before breakfast. 90 tablet 1  . LORazepam (ATIVAN) 2 MG tablet Take 2 mg by mouth 2 (two) times daily as needed.    Marland Kitchen losartan (COZAAR) 100 MG tablet Take 1 tablet (100 mg total) by mouth daily. 90 tablet 3  . potassium chloride SA (KLOR-CON) 20 MEQ tablet Take 1 tablet (20 mEq total) by mouth daily. 90 tablet 3  . predniSONE (DELTASONE) 20 MG tablet Take 1 tablet (20 mg total) by mouth 2 (two) times daily with a meal. 6 tablet 0  . sertraline (ZOLOFT) 25 MG tablet Take 1 tablet (25 mg total) by mouth daily. 30 tablet 1  . Tiotropium Bromide  Monohydrate (SPIRIVA RESPIMAT) 2.5 MCG/ACT AERS Inhale 1 spray into the lungs daily. 12 g 0  . UNKNOWN TO PATIENT Allergy injection every 2 weeks     Current Facility-Administered Medications  Medication Dose Route Frequency Provider Last Rate Last Admin  . Benralizumab SOSY 30 mg  30 mg Subcutaneous Q28 days Valentina Shaggy, MD   30 mg at 06/25/20 1041     Musculoskeletal: Strength & Muscle Tone: N/A Gait & Station: N/A Patient leans: N/A  Psychiatric Specialty Exam: Review of Systems  There were no vitals taken for this visit.There is no height or weight on file to calculate BMI.  General Appearance: {Appearance:22683}  Eye Contact:  {BHH EYE CONTACT:22684}  Speech:  Clear and Coherent  Volume:  Normal  Mood:  {BHH MOOD:22306}  Affect:  {Affect (PAA):22687}  Thought Process:  Coherent  Orientation:  Full (Time, Place, and Person)  Thought Content: Logical   Suicidal Thoughts:  {ST/HT (PAA):22692}  Homicidal Thoughts:  {ST/HT (PAA):22692}  Memory:  Immediate;   Good  Judgement:  {Judgement (PAA):22694}  Insight:  {Insight (PAA):22695}  Psychomotor Activity:  Normal  Concentration:  Concentration: Good and Attention Span: Good  Recall:  Good  Fund of Knowledge: Good  Language: Good  Akathisia:  No  Handed:  Right  AIMS (if indicated): not done  Assets:  Communication Skills Desire for Improvement  ADL's:  Intact  Cognition: WNL  Sleep:  {BHH GOOD/FAIR/POOR:22877}   Screenings: PHQ2-9   Flowsheet Row Office Visit from 02/13/2018 in [facility] Office Visit from 08/15/2017 in [facility] Office Visit from 02/10/2017 in [facility] Office Visit from 01/13/2017 in [facility]  PHQ-2 Total Score 0 0 0 0       Assessment and Plan:  Jose Burch is a 69 y.o. year old male with a history of depression, insomnia, malignant neoplasm of thyroid gland s/pthyroidectomyin2015.  ablation , postsurgical hypothyroidism,hypocalcemia,COPD, who presents for follow up appointment for below.   1. Insomnia, unspecified type # r/o sleep apnea Although he reports improvement in insomnia, he has daytime drowsiness, which he attributes to doxepin.  We will lower the dose of doxepin to minimize its side effect.  He is aware of its potential risk of palpitation and drowsiness. Noted that he does have snoring, daytime fatigue and middle insomnia. Although he will greatly benefit from sleep evaluation,he declined it at this timeas he has many other appointments to pursue.  2. MDD (major depressive disorder), recurrent episode, mild ([employer]) He reports worsening in depressive symptoms in the context of  his wife's medical condition.  Other psychosocial stressors includes demoralization due to his medical condition, retirement, and pandemic.  Will start sertraline to target depression and anxiety.  Discussed potential risk of serotonin syndrome with concomitant use of doxepin.  Noted that he is on lorazepam, prescribed by his PCP.  He is advised to limit its use when he has drowsiness.   Plan  1.Decreasedoxepin50mg  at night 2. Start sertraline 25 mg daily  3. Next appointment:1/3 at 9:30 for 20 mins, [email or web address] if text does not work - onlorazepam 2 mg BID by primary care doctor - heis not interested in therapy at this time  Past trials of medication:sinequan(doxepin), fluoxetine,lexapro (perceived side effect of nightmares),mirtazapine (nightmares),bupropion, Ambien, Trazodone,  The patient demonstrates the following risk factors for suicide: Chronic risk factors for suicide include:psychiatric disorder ofdepressionand medical illness of COPD,thyroidcancer. Acute risk factorsfor suicide include: unemployment and loss (financial, interpersonal, professional). Protective factorsfor this patient include: positive social support, coping skills and  hope for the future. Considering these factors, the overall suicide risk at this point appears to below. Patientisappropriate for outpatient follow up.  [name], MD 10/02/2020, 9:14 AM

## 2020-10-06 ENCOUNTER — Ambulatory Visit: Payer: PPO | Admitting: "Endocrinology

## 2020-10-06 DIAGNOSIS — J4522 Mild intermittent asthma with status asthmaticus: Secondary | ICD-10-CM | POA: Diagnosis not present

## 2020-10-13 ENCOUNTER — Telehealth: Payer: PPO | Admitting: Psychiatry

## 2020-10-16 ENCOUNTER — Telehealth: Payer: PPO | Admitting: Psychiatry

## 2020-11-11 ENCOUNTER — Other Ambulatory Visit: Payer: Self-pay | Admitting: *Deleted

## 2020-11-11 NOTE — Telephone Encounter (Signed)
error 

## 2021-06-04 NOTE — Progress Notes (Signed)
[place] MD/PA/NP OP Progress Note  06/08/2021 4:33 PM Jose Burch  MRN:  [id number]  Chief Complaint:  Chief Complaint   Follow-up; Depression    HPI:  This is a follow-up appointment for depression and then insomnia.  He has not been seen since Nov 2021.  He states that he ran out of doxepin since last month (although the order was last sent in nov, he filled this medication in June for 30 days).  He notices that he is not doing well since then.  He is unable to sleep, and feels depressed.  He states that he is not productive compared to before.  He has been working on 3 acres of grass.  It does bother him if he were not to work on it.  He does not have any help in this, and reports loss of his neighbor friends over the past year.  He tries not to watch TV as it is so depressing.  He states that he has been working since child, and he had no childhood.  He agrees that this is a loss as he is not working anymore.  When he is asked if there is anything he would like to work on, he states that he does not want to let his wife down, and would like to make her happy.  He has depressive symptoms as in PHQ-9.  He feels anxious and tense.  He denies irritability.  He has fair appetite.  He denies SI.  He denies panic attacks.  He denies alcohol use or drug use.   His wife presents to the interview.  She states that he was doing better when he was on medication.  She also thinks that he would do better if he were to do something during the day rather than sitting.  Although she is unable to take a walk with him due to her pain, she agrees to engage in some activities together.  They are thinking of having the dog.   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 Insomnia, unspecified type  G47.00     2. MDD  (major depressive disorder), recurrent episode, mild ([facility])  F33.0       Past Psychiatric History: Please see initial evaluation for full details. I have reviewed the history. No updates at this time.     Past Medical History:  Past Medical History:  Diagnosis Date   Asthma    Cancer ([place])    Thyroid   COPD (chronic obstructive pulmonary disease) ([place])    Hypertension    Insomnia    Thyroid disease    Tobacco abuse     Past Surgical History:  Procedure Laterality Date   skin cancer removed     THYROIDECTOMY  2015    Family Psychiatric History: Please see initial evaluation for full details. I have reviewed the history. No updates at this time.     Family History:  Family History  Problem Relation Age of Onset   Cancer Mother    Asthma Mother    Heart attack Sister    Allergic rhinitis Neg Hx    Eczema Neg Hx     Social History:  Social History   Socioeconomic History   Marital status: Married    Spouse name: Not on file  Number of children: Not on file   Years of education: Not on file   Highest education level: Not on file  Occupational History   Not on file  Tobacco Use   Smoking status: Every Day    Packs/day: 0.50    Types: Cigarettes   Smokeless tobacco: Never  Vaping Use   Vaping Use: Never used  Substance and Sexual Activity   Alcohol use: No   Drug use: No   Sexual activity: Never    Partners: Female  Other Topics Concern   Not on file  Social History Narrative   Not on file   Social Determinants of Health   Financial Resource Strain: Not on file  Food Insecurity: Not on file  Transportation Needs: Not on file  Physical Activity: Not on file  Stress: Not on file  Social Connections: Not on file    Allergies:  Allergies  Allergen Reactions   Aspirin Hives    Metabolic Disorder Labs: No results found for: HGBA1C, MPG No results found for: PROLACTIN No results found for: CHOL, TRIG, HDL, CHOLHDL, VLDL, LDLCALC Lab Results   Component Value Date   TSH 5.14 (H) 03/27/2020   TSH 0.158 (L) 05/05/2019    Therapeutic Level Labs: No results found for: LITHIUM No results found for: VALPROATE No components found for:  CBMZ  Current Medications: Current Outpatient Medications  Medication Sig Dispense Refill   doxepin (SINEQUAN) 50 MG capsule Take 1 capsule (50 mg total) by mouth at bedtime. 30 capsule 1   albuterol (VENTOLIN HFA) 108 (90 Base) MCG/ACT inhaler Inhale 2 puffs into the lungs every 6 (six) hours as needed for wheezing or shortness of breath. 18 g 1   amLODipine (NORVASC) 10 MG tablet Take 10 mg by mouth daily.     calcitRIOL (ROCALTROL) 0.5 MCG capsule TAKE 1 CAPSULE (0.5 MCG TOTAL) BY MOUTH DAILY. 90 capsule 0   calcium carbonate (TUMS EX) 750 MG chewable tablet Chew 1 tablet (750 mg total) by mouth 3 (three) times daily with meals. 180 tablet 3   diphenhydrAMINE (BENADRYL) 25 mg capsule Take 25 mg by mouth daily.     doxepin (SINEQUAN) 50 MG capsule Take 1 capsule (50 mg total) by mouth at bedtime. 90 capsule 0   hydrochlorothiazide (HYDRODIURIL) 12.5 MG tablet Take 12.5 mg by mouth daily.     ipratropium-albuterol (DUONEB) 0.5-2.5 (3) MG/3ML SOLN Take 3 mLs by nebulization 2 (two) times daily. 180 mL 5   levothyroxine (SYNTHROID) 150 MCG tablet Take 150 mcg by mouth daily.     levothyroxine (SYNTHROID) 175 MCG tablet Take 1 tablet (175 mcg total) by mouth daily before breakfast. 90 tablet 1   LORazepam (ATIVAN) 2 MG tablet Take 2 mg by mouth 2 (two) times daily as needed.     losartan (COZAAR) 100 MG tablet Take 1 tablet (100 mg total) by mouth daily. 90 tablet 3   potassium chloride SA (KLOR-CON) 20 MEQ tablet Take 1 tablet (20 mEq total) by mouth daily. 90 tablet 3   predniSONE (DELTASONE) 20 MG tablet Take 1 tablet (20 mg total) by mouth 2 (two) times daily with a meal. 6 tablet 0   Tiotropium Bromide Monohydrate (SPIRIVA RESPIMAT) 2.5 MCG/ACT AERS Inhale 1 spray into the lungs daily. 12 g 0    UNKNOWN TO PATIENT Allergy injection every 2 weeks     Current Facility-Administered Medications  Medication Dose Route Frequency Provider Last Rate Last Admin   Benralizumab SOSY 30 mg  30  mg Subcutaneous Q28 days Valentina Shaggy, MD   30 mg at 06/25/20 1041     Musculoskeletal: Strength & Muscle Tone: within normal limits Gait & Station: normal Patient leans: N/A  Psychiatric Specialty Exam: Review of Systems  Psychiatric/Behavioral:  Positive for decreased concentration, dysphoric mood and sleep disturbance. Negative for agitation, behavioral problems, confusion, hallucinations, self-injury and suicidal ideas. The patient is nervous/anxious. The patient is not hyperactive.   All other systems reviewed and are negative.  Blood pressure (!) 144/76, pulse 94, weight 230 lb 12.8 oz (104.7 kg).Body mass index is 32.65 kg/m.  General Appearance: Fairly Groomed  Eye Contact:  Good  Speech:  Clear and Coherent  Volume:  Normal  Mood:  Depressed  Affect:  Appropriate, Congruent, and sad at times  Thought Process:  Coherent  Orientation:  Full (Time, Place, and Person)  Thought Content: Logical   Suicidal Thoughts:  No  Homicidal Thoughts:  No  Memory:  Immediate;   Good  Judgement:  Good  Insight:  Good  Psychomotor Activity:  Normal  Concentration:  Concentration: Good and Attention Span: Good  Recall:  Good  Fund of Knowledge: Good  Language: Good  Akathisia:  No  Handed:  Right  AIMS (if indicated): not done  Assets:  Communication Skills Desire for Improvement  ADL's:  Intact  Cognition: WNL  Sleep:  Poor   Screenings: PHQ2-9    Flowsheet Row Office Visit from 06/08/2021 in [facility] Visit from 02/13/2018 in [facility] Office Visit from 08/15/2017 in [facility] Office Visit from 02/10/2017 in [facility] Office Visit from 01/13/2017 in [facility]  PHQ-2 Total Score 3 0 0 0 0  PHQ-9 Total Score 9 -- -- -- --      [facility] from 05/08/2019 in [facility] No Risk        Assessment and Plan:  Jose Burch is a 70 y.o. year old male with a history of depression, insomnia, malignant neoplasm of thyroid gland s/p thyroidectomy in 2015. ablation , postsurgical hypothyroidism, hypocalcemia, COPD, who presents for follow up appointment for below.   1. Insomnia, unspecified type 2. MDD (major depressive disorder), recurrent episode, mild ([facility]) R/o sleep apnea He reports worsening in insomnia and depressive symptoms since he ran out off doxepin about a month ago.  Psychosocial stressors includes demoralization due to his medical condition, retirement, loss of his friends over the past year. Will restart doxepin to target insomnia and depression.  Coached behavioral activation. Noted that although he does have snoring, daytime fatigue and middle insomnia, he is not interested in pursuing sleep evaluation.  Will continue to discuss.      Plan Restart doxepin 50 mg at night  Next appointment- 10/24 at 2:30, video (will call home phone if video does not work) [email or web address] - on lorazepam 2 mg BID by primary care doctor - he is not interested in therapy at this time   Past trials of medication: sinequan (doxepin), fluoxetine, lexapro (perceived side effect of nightmares), mirtazapine (nightmares), bupropion, Ambien, Trazodone,    The patient demonstrates the following risk factors for suicide: Chronic risk factors for suicide include: psychiatric disorder of depression and medical illness of COPD, thyroid cancer. Acute risk factors for suicide include: unemployment and loss (financial, interpersonal, professional). Protective factors for this patient include: positive social support, coping skills and hope for the future. Considering these factors, the overall suicide  risk  at this point appears to be low. Patient is appropriate for outpatient follow up.         [name], MD 06/08/2021, 4:33 PM

## 2021-06-08 ENCOUNTER — Ambulatory Visit (INDEPENDENT_AMBULATORY_CARE_PROVIDER_SITE_OTHER): Payer: Medicare HMO | Admitting: Psychiatry

## 2021-06-08 ENCOUNTER — Encounter: Payer: Self-pay | Admitting: Psychiatry

## 2021-06-08 ENCOUNTER — Other Ambulatory Visit: Payer: Self-pay

## 2021-06-08 VITALS — BP 144/76 | HR 94 | Wt 230.8 lb

## 2021-06-08 DIAGNOSIS — F330 Major depressive disorder, recurrent, mild: Secondary | ICD-10-CM

## 2021-06-08 DIAGNOSIS — G4700 Insomnia, unspecified: Secondary | ICD-10-CM

## 2021-06-08 MED ORDER — DOXEPIN HCL 50 MG PO CAPS: 50.0000 mg | ORAL_CAPSULE | Freq: Every day | ORAL | 1 refills | Status: DC

## 2021-07-30 NOTE — Progress Notes (Signed)
Virtual Visit via Video Note  I connected with Jose Burch on 08/03/21 at  2:30 PM EDT by a video enabled telemedicine application and verified that I am speaking with the correct person using two identifiers.  Location: Patient: home Provider: office Persons participated in the visit- patient, provider    I discussed the limitations of evaluation and management by telemedicine and the availability of in person appointments. The patient expressed understanding and agreed to proceed.    I discussed the assessment and treatment plan with the patient. The patient was provided an opportunity to ask questions and all were answered. The patient agreed with the plan and demonstrated an understanding of the instructions.   The patient was advised to call back or seek an in-person evaluation if the symptoms worsen or if the condition fails to improve as anticipated.  I provided 16 minutes of non-face-to-face time during this encounter.   [name], MD    [facility] MD/PA/NP OP Progress Note  08/03/2021 3:02 PM Jose Burch  MRN:  [id number]  Chief Complaint:  Chief Complaint   Follow-up; Insomnia; Depression    HPI:  This is a follow-up appointment for depression and insomnia.  He states he is not doing good due to worsening in pain secondary to psoriasis.  He has been seen by the provider, and has received steroid injection.  He tends to feel more panicky when his wife is sick as he does not know what to do.  He is trying to help her.  His sleep has been a little better since restarting doxepin.  Noted that he states that he was told by the pharmacy that he filled the medication on October 2, although he has not filled since the late September.  He will run out of his medication in a few days.  He feels down.  He has fair appetite.  He has occasional difficulty in concentration.  He denies SI.  He feels anxious.  He is willing to try sertraline at this time.    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 Insomnia, unspecified type  G47.00     2. MDD (major depressive disorder), recurrent episode, mild ([place])  F33.0       Past Psychiatric History: Please see initial evaluation for full details. I have reviewed the history. No updates at this time.     Past Medical History:  Past Medical History:  Diagnosis Date   Asthma    Cancer ([place])    Thyroid   COPD (chronic obstructive pulmonary disease) ([place])    Hypertension    Insomnia    Thyroid disease    Tobacco abuse     Past Surgical History:  Procedure Laterality Date   skin cancer removed     THYROIDECTOMY  2015    Family Psychiatric History: Please see initial evaluation for full details. I have reviewed the history. No updates at this time.     Family History:  Family History  Problem Relation Age of Onset   Cancer Mother    Asthma Mother    Heart  attack Sister    Allergic rhinitis Neg Hx    Eczema Neg Hx     Social History:  Social History   Socioeconomic History   Marital status: Married    Spouse name: Not on file   Number of children: Not on file   Years of education: Not on file   Highest education level: Not on file  Occupational History   Not on file  Tobacco Use   Smoking status: Every Day    Packs/day: 0.50    Types: Cigarettes   Smokeless tobacco: Never  Vaping Use   Vaping Use: Never used  Substance and Sexual Activity   Alcohol use: No   Drug use: No   Sexual activity: Never    Partners: Female  Other Topics Concern   Not on file  Social History Narrative   Not on file   Social Determinants of Health   Financial  Resource Strain: Not on file  Food Insecurity: Not on file  Transportation Needs: Not on file  Physical Activity: Not on file  Stress: Not on file  Social Connections: Not on file    Allergies:  Allergies  Allergen Reactions   Aspirin Hives    Metabolic Disorder Labs: No results found for: HGBA1C, MPG No results found for: PROLACTIN No results found for: CHOL, TRIG, HDL, CHOLHDL, VLDL, LDLCALC Lab Results  Component Value Date   TSH 5.14 (H) 03/27/2020   TSH 0.158 (L) 05/05/2019    Therapeutic Level Labs: No results found for: LITHIUM No results found for: VALPROATE No components found for:  CBMZ  Current Medications: Current Outpatient Medications  Medication Sig Dispense Refill   sertraline (ZOLOFT) 25 MG tablet Take 1 tablet (25 mg total) by mouth at bedtime. 30 tablet 0   albuterol (VENTOLIN HFA) 108 (90 Base) MCG/ACT inhaler Inhale 2 puffs into the lungs every 6 (six) hours as needed for wheezing or shortness of breath. 18 g 1   amLODipine (NORVASC) 10 MG tablet Take 10 mg by mouth daily.     calcitRIOL (ROCALTROL) 0.5 MCG capsule TAKE 1 CAPSULE (0.5 MCG TOTAL) BY MOUTH DAILY. 90 capsule 0   calcium carbonate (TUMS EX) 750 MG chewable tablet Chew 1 tablet (750 mg total) by mouth 3 (three) times daily with meals. 180 tablet 3   diphenhydrAMINE (BENADRYL) 25 mg capsule Take 25 mg by mouth daily.     doxepin (SINEQUAN) 50 MG capsule Take 1 capsule (50 mg total) by mouth at bedtime. 90 capsule 0   doxepin (SINEQUAN) 50 MG capsule Take 1 capsule (50 mg total) by mouth at bedtime. 30 capsule 0   hydrochlorothiazide (HYDRODIURIL) 12.5 MG tablet Take 12.5 mg by mouth daily.     ipratropium-albuterol (DUONEB) 0.5-2.5 (3) MG/3ML SOLN Take 3 mLs by nebulization 2 (two) times daily. 180 mL 5   levothyroxine (SYNTHROID) 150 MCG tablet Take 150 mcg by mouth daily.     levothyroxine (SYNTHROID) 175 MCG tablet Take 1 tablet (175 mcg total) by mouth daily before breakfast. 90 tablet 1    LORazepam (ATIVAN) 2 MG tablet Take 2 mg by mouth 2 (two) times daily as needed.     losartan (COZAAR) 100 MG tablet Take 1 tablet (100 mg total) by mouth daily. 90 tablet 3   potassium chloride SA (KLOR-CON) 20 MEQ tablet Take 1 tablet (20 mEq total) by mouth daily. 90 tablet 3   predniSONE (DELTASONE) 20 MG tablet Take 1 tablet (20 mg total) by mouth 2 (two)  times daily with a meal. 6 tablet 0   Tiotropium Bromide Monohydrate (SPIRIVA RESPIMAT) 2.5 MCG/ACT AERS Inhale 1 spray into the lungs daily. 12 g 0   UNKNOWN TO PATIENT Allergy injection every 2 weeks     Current Facility-Administered Medications  Medication Dose Route Frequency Provider Last Rate Last Admin   Benralizumab SOSY 30 mg  30 mg Subcutaneous Q28 days Valentina Shaggy, MD   30 mg at 06/25/20 1041     Musculoskeletal: Strength & Muscle Tone:  N/A Gait & Station:  N/A Patient leans: N/A  Psychiatric Specialty Exam: Review of Systems  Psychiatric/Behavioral:  Positive for decreased concentration, dysphoric mood and sleep disturbance. Negative for agitation, behavioral problems, confusion, hallucinations, self-injury and suicidal ideas. The patient is nervous/anxious. The patient is not hyperactive.   All other systems reviewed and are negative.  There were no vitals taken for this visit.There is no height or weight on file to calculate BMI.  General Appearance: Fairly Groomed  Eye Contact:  Good  Speech:  Clear and Coherent  Volume:  Normal  Mood:   not good  Affect:  Appropriate, Congruent, and down  Thought Process:  Coherent  Orientation:  Full (Time, Place, and Person)  Thought Content: Logical   Suicidal Thoughts:  No  Homicidal Thoughts:  No  Memory:  Immediate;   Good  Judgement:  Good  Insight:  Fair  Psychomotor Activity:  Normal  Concentration:  Concentration: Good and Attention Span: Good  Recall:  Good  Fund of Knowledge: Good  Language: Good  Akathisia:  No  Handed:  Right  AIMS (if  indicated): not done  Assets:  Communication Skills Desire for Improvement  ADL's:  Intact  Cognition: WNL  Sleep:  Poor   Screenings: PHQ2-9    Flowsheet Row Office Visit from 06/08/2021 in [facility] Visit from 02/13/2018 in [facility] Office Visit from 08/15/2017 in [facility] Office Visit from 02/10/2017 in [facility] Office Visit from 01/13/2017 in [facility]  PHQ-2 Total Score 3 0 0 0 0  PHQ-9 Total Score 9 -- -- -- --      Flowsheet Row ED from 05/08/2019 in [facility] No Risk        Assessment and Plan:  Jose Burch is a 70 y.o. year old male with a history of depression, insomnia, malignant neoplasm of thyroid gland s/p thyroidectomy in 2015. ablation , postsurgical hypothyroidism, hypocalcemia, COPD, who presents for follow up appointment for below.   1. Insomnia, unspecified type 2. MDD (major depressive disorder), recurrent episode, mild ([place]) He reports slight worsening in his mood symptoms in the context of worsening in psoriasis.  Other psychosocial stressors includes taking care of his wife with medical illness,  demoralization due to his medical condition, retirement, loss of his friends over the past year.  Will start sertraline to target depression and anxiety.  Discussed potential GI side effect and drowsiness.  Will monitor for any sertraline syndrome.  Will continue doxepin at the current dose to target insomnia/depression.  Will not uptitrate the dose given his complaint of dry mouth.  Noted that although he reports snoring, daytime fatigue and insomnia, he is not interested in pursuing sleep evaluation.  Plan Start sertraline 25 mg at night  Continue doxepin 50 mg at night (monitor dry mouth) Next appointment- 11/21 at 8:30, video (will call home phone if video does not work)  - on  lorazepam 2 mg BID by primary care doctor - he is not interested in therapy at this time   Past trials of medication: sinequan (doxepin), fluoxetine, lexapro (perceived side effect of nightmares), mirtazapine (nightmares), bupropion, Ambien, Trazodone,    The patient demonstrates the following risk factors for suicide: Chronic risk factors for suicide include: psychiatric disorder of depression and medical illness of COPD, thyroid cancer. Acute risk factors for suicide include: unemployment and loss (financial, interpersonal, professional). Protective factors for this patient include: positive social support, coping skills and hope for the future. Considering these factors, the overall suicide risk at this point appears to be low. Patient is appropriate for outpatient follow up.          [name], MD 08/03/2021, 3:02 PM

## 2021-08-03 ENCOUNTER — Encounter: Payer: Self-pay | Admitting: Psychiatry

## 2021-08-03 ENCOUNTER — Telehealth (INDEPENDENT_AMBULATORY_CARE_PROVIDER_SITE_OTHER): Payer: Medicare HMO | Admitting: Psychiatry

## 2021-08-03 ENCOUNTER — Other Ambulatory Visit: Payer: Self-pay

## 2021-08-03 DIAGNOSIS — G4700 Insomnia, unspecified: Secondary | ICD-10-CM | POA: Diagnosis not present

## 2021-08-03 DIAGNOSIS — F330 Major depressive disorder, recurrent, mild: Secondary | ICD-10-CM | POA: Diagnosis not present

## 2021-08-03 MED ORDER — SERTRALINE HCL 25 MG PO TABS: 25.0000 mg | ORAL_TABLET | Freq: Every day | ORAL | 0 refills | Status: DC

## 2021-08-03 MED ORDER — DOXEPIN HCL 50 MG PO CAPS: 50.0000 mg | ORAL_CAPSULE | Freq: Every day | ORAL | 0 refills | Status: DC

## 2021-08-03 NOTE — Patient Instructions (Signed)
Start sertraline 25 mg at night  Continue doxepin 50 mg at night  Next appointment- 11/21 at 8:30

## 2021-08-04 ENCOUNTER — Telehealth: Payer: Self-pay

## 2021-08-04 NOTE — Telephone Encounter (Signed)
Ordered yesterday.

## 2021-08-04 NOTE — Telephone Encounter (Signed)
received fax requesting a refill on the doxepin 50mg 

## 2021-08-05 ENCOUNTER — Telehealth: Payer: Self-pay

## 2021-08-05 NOTE — Telephone Encounter (Signed)
Decline as we may change the dose.

## 2021-08-05 NOTE — Telephone Encounter (Signed)
received fax requesting a new rx for a 90 day supply of the doxepin

## 2021-08-27 ENCOUNTER — Other Ambulatory Visit: Payer: Self-pay | Admitting: Psychiatry

## 2021-08-27 NOTE — Progress Notes (Signed)
Virtual Visit via Video Note  I connected with Jose Burch on 08/31/21 at  8:30 AM EST by a video enabled telemedicine application and verified that I am speaking with the correct person using two identifiers.  Location: Patient: home Provider: office Persons participated in the visit- patient, provider    I discussed the limitations of evaluation and management by telemedicine and the availability of in person appointments. The patient expressed understanding and agreed to proceed.    I discussed the assessment and treatment plan with the patient. The patient was provided an opportunity to ask questions and all were answered. The patient agreed with the plan and demonstrated an understanding of the instructions.   The patient was advised to call back or seek an in-person evaluation if the symptoms worsen or if the condition fails to improve as anticipated.  I provided 17 minutes of non-face-to-face time during this encounter.   [name], MD    [facility] MD/PA/NP OP Progress Note  08/31/2021 8:58 AM Jose Burch  MRN:  [id number]  Chief Complaint:  Chief Complaint   Depression; Follow-up    HPI:  This is a follow-up appointment for depression.  He states that he continues to have pain from psoriasis.  He also takes care of his wife, who is on antibiotics for UTI, and she is also suffering from pain.  He tends to feel down as he needs to accept how he is doing compared to the past.  He tries to go outside as he usually feels better.  He enjoys pruning trees.  He also enjoyed the birthday party for her granddaughter in college the other day.  She believes he is not worried as much since starting sertraline.  Although he continues to have insomnia, he attributes this to nocturia.  He feels okay in energy.  He denies change in appetite.  He has occasional anhedonia, although he will force himself to do things.  He has fair concentration.  He denies SI.  He denies irritability.  He  feels anxious at times.  He denies panic attacks.  Although he continues to have dry mouth, it is manageable.  He is willing to try higher dose of sertraline at this time.   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 MDD (major depressive disorder), recurrent episode, mild ([place])  F33.0     2. Insomnia, unspecified type  G47.00       Past Psychiatric History: Please see initial evaluation for full details. I have reviewed the history. No updates at this time.     Past Medical History:  Past Medical History:  Diagnosis Date   Asthma    Cancer ([place])    Thyroid   COPD (chronic obstructive pulmonary disease) ([place])    Hypertension    Insomnia    Thyroid disease    Tobacco abuse     Past Surgical History:  Procedure Laterality Date   skin cancer removed     THYROIDECTOMY  2015    Family Psychiatric History: Please see initial evaluation for full details. I  have reviewed the history. No updates at this time.     Family History:  Family History  Problem Relation Age of Onset   Cancer Mother    Asthma Mother    Heart attack Sister    Allergic rhinitis Neg Hx    Eczema Neg Hx     Social History:  Social History   Socioeconomic History   Marital status: Married    Spouse name: Not on file   Number of children: Not on file   Years of education: Not on file   Highest education level: Not on file  Occupational History   Not on file  Tobacco Use   Smoking status: Every Day    Packs/day: 0.50    Types: Cigarettes   Smokeless tobacco: Never  Vaping Use   Vaping Use: Never used  Substance and Sexual Activity   Alcohol  use: No   Drug use: No   Sexual activity: Never    Partners: Female  Other Topics Concern   Not on file  Social History Narrative   Not on file   Social Determinants of Health   Financial Resource Strain: Not on file  Food Insecurity: Not on file  Transportation Needs: Not on file  Physical Activity: Not on file  Stress: Not on file  Social Connections: Not on file    Allergies:  Allergies  Allergen Reactions   Aspirin Hives    Metabolic Disorder Labs: No results found for: HGBA1C, MPG No results found for: PROLACTIN No results found for: CHOL, TRIG, HDL, CHOLHDL, VLDL, LDLCALC Lab Results  Component Value Date   TSH 5.14 (H) 03/27/2020   TSH 0.158 (L) 05/05/2019    Therapeutic Level Labs: No results found for: LITHIUM No results found for: VALPROATE No components found for:  CBMZ  Current Medications: Current Outpatient Medications  Medication Sig Dispense Refill   albuterol (VENTOLIN HFA) 108 (90 Base) MCG/ACT inhaler Inhale 2 puffs into the lungs every 6 (six) hours as needed for wheezing or shortness of breath. 18 g 1   amLODipine (NORVASC) 10 MG tablet Take 10 mg by mouth daily.     calcitRIOL (ROCALTROL) 0.5 MCG capsule TAKE 1 CAPSULE (0.5 MCG TOTAL) BY MOUTH DAILY. 90 capsule 0   calcium carbonate (TUMS EX) 750 MG chewable tablet Chew 1 tablet (750 mg total) by mouth 3 (three) times daily with meals. 180 tablet 3   diphenhydrAMINE (BENADRYL) 25 mg capsule Take 25 mg by mouth daily.     [START ON 09/03/2021] doxepin (SINEQUAN) 50 MG capsule Take 1 capsule (50 mg total) by mouth at bedtime. 90 capsule 0   hydrochlorothiazide (HYDRODIURIL) 12.5 MG tablet Take 12.5 mg by mouth daily.     ipratropium-albuterol (DUONEB) 0.5-2.5 (3) MG/3ML SOLN Take 3 mLs by nebulization 2 (two) times daily. 180 mL 5   levothyroxine (SYNTHROID) 150 MCG tablet Take 150 mcg by mouth daily.     levothyroxine (SYNTHROID) 175 MCG tablet Take 1 tablet (175 mcg total) by mouth daily  before breakfast. 90 tablet 1   LORazepam (ATIVAN) 2 MG tablet Take 2 mg by mouth 2 (two) times daily as needed.     losartan (COZAAR) 100 MG tablet Take 1 tablet (100 mg total) by mouth daily. 90 tablet 3   potassium chloride SA (KLOR-CON) 20 MEQ tablet Take 1 tablet (20 mEq total) by mouth daily. 90 tablet 3   predniSONE (DELTASONE) 20 MG tablet Take 1 tablet (20 mg total) by mouth 2 (two)  times daily with a meal. 6 tablet 0   sertraline (ZOLOFT) 50 MG tablet Take 1 tablet (50 mg total) by mouth at bedtime. 60 tablet 0   Tiotropium Bromide Monohydrate (SPIRIVA RESPIMAT) 2.5 MCG/ACT AERS Inhale 1 spray into the lungs daily. 12 g 0   UNKNOWN TO PATIENT Allergy injection every 2 weeks     Current Facility-Administered Medications  Medication Dose Route Frequency Provider Last Rate Last Admin   Benralizumab SOSY 30 mg  30 mg Subcutaneous Q28 days Valentina Shaggy, MD   30 mg at 06/25/20 1041     Musculoskeletal: Strength & Muscle Tone:  N/A Gait & Station:  N/A Patient leans: N/A  Psychiatric Specialty Exam: Review of Systems  Psychiatric/Behavioral:  Positive for decreased concentration, dysphoric mood and sleep disturbance. Negative for agitation, behavioral problems, confusion, hallucinations, self-injury and suicidal ideas. The patient is nervous/anxious. The patient is not hyperactive.   All other systems reviewed and are negative.  There were no vitals taken for this visit.There is no height or weight on file to calculate BMI.  General Appearance: Fairly Groomed  Eye Contact:  Good  Speech:  Clear and Coherent  Volume:  Normal  Mood:   better  Affect:  Appropriate, Congruent, and slightly down  Thought Process:  Coherent  Orientation:  Full (Time, Place, and Person)  Thought Content: Logical   Suicidal Thoughts:  No  Homicidal Thoughts:  No  Memory:  Immediate;   Good  Judgement:  Good  Insight:  Good  Psychomotor Activity:  Normal  Concentration:  Concentration: Good  and Attention Span: Good  Recall:  Good  Fund of Knowledge: Good  Language: Good  Akathisia:  No  Handed:  Right  AIMS (if indicated): not done  Assets:  Communication Skills Desire for Improvement  ADL's:  Intact  Cognition: WNL  Sleep:  Poor   Screenings: PHQ2-9    Flowsheet Row Office Visit from 06/08/2021 in [facility] Visit from 02/13/2018 in [facility] Office Visit from 08/15/2017 in [facility] Office Visit from 02/10/2017 in [facility] Office Visit from 01/13/2017 in [facility]  PHQ-2 Total Score 3 0 0 0 0  PHQ-9 Total Score 9 -- -- -- --      Flowsheet Row ED from 05/08/2019 in [place] No Risk        Assessment and Plan:  DOREAN DANIELLO is a 70 y.o. year old male with a history of depression, insomnia, malignant neoplasm of thyroid gland s/p thyroidectomy in 2015. ablation , postsurgical hypothyroidism, hypocalcemia, COPD, who presents for follow up appointment for below.    1. MDD (major depressive disorder), recurrent episode, mild ([place]) 2. Insomnia, unspecified type There has been overall improvement in depressive symptoms since starting sertraline.  Psychosocial stressors includes pain secondary to psoriasis, demoralization due to his medical condition, retirement, loss of his friends over the past year, taking care of his wife with medical condition.  Will do further up titration of sertraline to optimize treatment for depression and anxiety.  We will monitor serotonin syndrome.  Will continue doxepin at the current dose to target insomnia/depression. Noted that although he reports snoring, daytime fatigue and insomnia, he is not interested in pursuing sleep evaluation.   Plan Increase sertraline 50 mg daily Continue doxepin 50 mg at night (monitor dry mouth) Next appointment- 1/4 at 10 AM for  30 mins, video (will call home phone if  video does not work)  - on lorazepam 2 mg BID by primary care doctor - he is not interested in therapy at this time   Past trials of medication: sinequan (doxepin), fluoxetine, lexapro (perceived side effect of nightmares), mirtazapine (nightmares), bupropion, Ambien, Trazodone,    The patient demonstrates the following risk factors for suicide: Chronic risk factors for suicide include: psychiatric disorder of depression and medical illness of COPD, thyroid cancer. Acute risk factors for suicide include: unemployment and loss (financial, interpersonal, professional). Protective factors for this patient include: positive social support, coping skills and hope for the future. Considering these factors, the overall suicide risk at this point appears to be low. Patient is appropriate for outpatient follow up.        [name], MD 08/31/2021, 8:58 AM

## 2021-08-31 ENCOUNTER — Telehealth (INDEPENDENT_AMBULATORY_CARE_PROVIDER_SITE_OTHER): Payer: Medicare HMO | Admitting: Psychiatry

## 2021-08-31 ENCOUNTER — Other Ambulatory Visit: Payer: Self-pay

## 2021-08-31 ENCOUNTER — Encounter: Payer: Self-pay | Admitting: Psychiatry

## 2021-08-31 ENCOUNTER — Other Ambulatory Visit: Payer: Self-pay | Admitting: Psychiatry

## 2021-08-31 DIAGNOSIS — G4700 Insomnia, unspecified: Secondary | ICD-10-CM | POA: Diagnosis not present

## 2021-08-31 DIAGNOSIS — F330 Major depressive disorder, recurrent, mild: Secondary | ICD-10-CM

## 2021-08-31 MED ORDER — DOXEPIN HCL 50 MG PO CAPS: 50.0000 mg | ORAL_CAPSULE | Freq: Every day | ORAL | 0 refills | Status: DC

## 2021-08-31 MED ORDER — SERTRALINE HCL 50 MG PO TABS: 50.0000 mg | ORAL_TABLET | Freq: Every day | ORAL | 0 refills | Status: DC

## 2021-08-31 NOTE — Patient Instructions (Addendum)
Increase sertraline 50 mg daily Continue doxepin 50 mg at night  Next appointment- 1/4 at 10 AM, video

## 2021-09-23 ENCOUNTER — Ambulatory Visit (INDEPENDENT_AMBULATORY_CARE_PROVIDER_SITE_OTHER): Payer: Medicare HMO | Admitting: *Deleted

## 2021-09-23 DIAGNOSIS — Z23 Encounter for immunization: Secondary | ICD-10-CM | POA: Diagnosis not present

## 2021-09-23 NOTE — Progress Notes (Signed)
Presents for flu shot

## 2021-10-12 NOTE — Progress Notes (Signed)
Virtual Visit via Telephone Note  I connected with Jose Burch on 10/14/21 at 10:00 AM EST by telephone and verified that I am speaking with the correct person using two identifiers.  Location: Patient: home Provider: office Persons participated in the visit- patient, provider    I discussed the limitations, risks, security and privacy concerns of performing an evaluation and management service by telephone and the availability of in person appointments. I also discussed with the patient that there may be a patient responsible charge related to this service. The patient expressed understanding and agreed to proceed.   I discussed the assessment and treatment plan with the patient. The patient was provided an opportunity to ask questions and all were answered. The patient agreed with the plan and demonstrated an understanding of the instructions.   The patient was advised to call back or seek an in-person evaluation if the symptoms worsen or if the condition fails to improve as anticipated.  I provided 16 minutes of non-face-to-face time during this encounter.   [name], MD    [facility] MD/PA/NP OP Progress Note  10/14/2021 12:08 PM Jose Burch  MRN:  [id number]  Chief Complaint:  Chief Complaint   Follow-up; Depression    HPI:  This is a follow-up appointment for depression.  He states that he is not doing well due to worsening in pain due to psoriasis, which "Run you crazy."  He has been able to go outside when the weather is good, and when he has less pain.  He believes higher dose of surgery has been helpful; he has not had any panic attacks since the last visit.  He is concerned about his son's mother, who is "dying." She is bed ridden, and is on feeding tube.  He is trying to comfort his son due to the current situation.  He sleeps 3 to 4 hours.  He feels relatively rested in the morning.  He feels depressed at times.  He has occasional anhedonia.  He denies change in  appetite.  He denies SI.  He feels comfortable to stay on the current medication regimen at this time.   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 MDD (major depressive disorder), recurrent episode, mild ([place])  F33.0     2. Insomnia, unspecified type  G47.00       Past Psychiatric History: Please see initial evaluation for full details. I have reviewed the history. No updates at this time.     Past Medical History:  Past Medical History:  Diagnosis Date   Asthma    Cancer ([place])    Thyroid   COPD (chronic obstructive pulmonary disease) ([place])    Hypertension    Insomnia    Thyroid disease    Tobacco abuse     Past Surgical History:  Procedure Laterality Date   skin cancer removed     THYROIDECTOMY  2015    Family Psychiatric History: Please see initial evaluation for full details. I have reviewed the history. No updates at this time.     Family History:  Family History  Problem Relation Age of Onset   Cancer Mother    Asthma Mother    Heart attack Sister    Allergic rhinitis Neg Hx    Eczema Neg Hx  Social History:  Social History   Socioeconomic History   Marital status: Married    Spouse name: Not on file   Number of children: Not on file   Years of education: Not on file   Highest education level: Not on file  Occupational History   Not on file  Tobacco Use   Smoking status: Every Day    Packs/day: 0.50    Types: Cigarettes   Smokeless tobacco: Never  Vaping Use   Vaping Use: Never used  Substance and Sexual Activity   Alcohol use: No   Drug use: No   Sexual activity: Never    Partners: Female  Other Topics Concern   Not on file  Social History Narrative   Not on file   Social Determinants of Health   Financial Resource Strain: Not on file   Food Insecurity: Not on file  Transportation Needs: Not on file  Physical Activity: Not on file  Stress: Not on file  Social Connections: Not on file    Allergies:  Allergies  Allergen Reactions   Aspirin Hives    Metabolic Disorder Labs: No results found for: HGBA1C, MPG No results found for: PROLACTIN No results found for: CHOL, TRIG, HDL, CHOLHDL, VLDL, LDLCALC Lab Results  Component Value Date   TSH 5.14 (H) 03/27/2020   TSH 0.158 (L) 05/05/2019    Therapeutic Level Labs: No results found for: LITHIUM No results found for: VALPROATE No components found for:  CBMZ  Current Medications: Current Outpatient Medications  Medication Sig Dispense Refill   albuterol (VENTOLIN HFA) 108 (90 Base) MCG/ACT inhaler Inhale 2 puffs into the lungs every 6 (six) hours as needed for wheezing or shortness of breath. 18 g 1   amLODipine (NORVASC) 10 MG tablet Take 10 mg by mouth daily.     calcitRIOL (ROCALTROL) 0.5 MCG capsule TAKE 1 CAPSULE (0.5 MCG TOTAL) BY MOUTH DAILY. 90 capsule 0   calcium carbonate (TUMS EX) 750 MG chewable tablet Chew 1 tablet (750 mg total) by mouth 3 (three) times daily with meals. 180 tablet 3   diphenhydrAMINE (BENADRYL) 25 mg capsule Take 25 mg by mouth daily.     [START ON 12/02/2021] doxepin (SINEQUAN) 50 MG capsule Take 1 capsule (50 mg total) by mouth at bedtime. 90 capsule 0   hydrochlorothiazide (HYDRODIURIL) 12.5 MG tablet Take 12.5 mg by mouth daily.     ipratropium-albuterol (DUONEB) 0.5-2.5 (3) MG/3ML SOLN Take 3 mLs by nebulization 2 (two) times daily. 180 mL 5   levothyroxine (SYNTHROID) 150 MCG tablet Take 150 mcg by mouth daily.     levothyroxine (SYNTHROID) 175 MCG tablet Take 1 tablet (175 mcg total) by mouth daily before breakfast. 90 tablet 1   LORazepam (ATIVAN) 2 MG tablet Take 2 mg by mouth 2 (two) times daily as needed.     losartan (COZAAR) 100 MG tablet Take 1 tablet (100 mg total) by mouth daily. 90 tablet 3   potassium chloride SA  (KLOR-CON) 20 MEQ tablet Take 1 tablet (20 mEq total) by mouth daily. 90 tablet 3   predniSONE (DELTASONE) 20 MG tablet Take 1 tablet (20 mg total) by mouth 2 (two) times daily with a meal. 6 tablet 0   [START ON 10/31/2021] sertraline (ZOLOFT) 50 MG tablet Take 1 tablet (50 mg total) by mouth at bedtime. 90 tablet 0   Tiotropium Bromide Monohydrate (SPIRIVA RESPIMAT) 2.5 MCG/ACT AERS Inhale 1 spray into the lungs daily. 12 g 0   UNKNOWN TO PATIENT  Allergy injection every 2 weeks     Current Facility-Administered Medications  Medication Dose Route Frequency Provider Last Rate Last Admin   Benralizumab SOSY 30 mg  30 mg Subcutaneous Q28 days Valentina Shaggy, MD   30 mg at 06/25/20 1041     Musculoskeletal: Strength & Muscle Tone:  N?A Gait & Station:  N/A Patient leans: N/A  Psychiatric Specialty Exam: Review of Systems  Psychiatric/Behavioral:  Positive for dysphoric mood and sleep disturbance. Negative for agitation, behavioral problems, confusion, decreased concentration, hallucinations, self-injury and suicidal ideas. The patient is nervous/anxious. The patient is not hyperactive.   All other systems reviewed and are negative.  There were no vitals taken for this visit.There is no height or weight on file to calculate BMI.  General Appearance: NA  Eye Contact:  NA  Speech:  Clear and Coherent  Volume:  Normal  Mood:   not good  Affect:  NA  Thought Process:  Coherent  Orientation:  Full (Time, Place, and Person)  Thought Content: Logical   Suicidal Thoughts:  No  Homicidal Thoughts:  No  Memory:  Immediate;   Good  Judgement:  Good  Insight:  Good  Psychomotor Activity:  Normal  Concentration:  Concentration: Good and Attention Span: Good  Recall:  Good  Fund of Knowledge: Good  Language: Good  Akathisia:  No  Handed:  Right  AIMS (if indicated): not done  Assets:  Communication Skills Desire for Improvement  ADL's:  Intact  Cognition: WNL  Sleep:  Poor    Screenings: PHQ2-9    Flowsheet Row Office Visit from 06/08/2021 in [facility] Visit from 02/13/2018 in [facility] Office Visit from 08/15/2017 in [facility] Office Visit from 02/10/2017 in [facility] Office Visit from 01/13/2017 in [facility]  PHQ-2 Total Score 3 0 0 0 0  PHQ-9 Total Score 9 -- -- -- --      Flowsheet Row ED from 05/08/2019 in [facility] No Risk        Assessment and Plan:  Jose Burch is a 71 y.o. year old male with a history of ,depression, insomnia, malignant neoplasm of thyroid gland s/p thyroidectomy in 2015. ablation , postsurgical hypothyroidism, hypocalcemia, COPD who presents for follow up appointment for below.   1. MDD (major depressive disorder), recurrent episode, mild ([place]) 2. Insomnia, unspecified type He reports improvement in anxiety since up titration of sertraline.  Recent psychosocial stressors includes pain secondary to psoriasis, and his son's mother/ex-wife's medical condition.  Other psychosocial stressors includes demoralization due to his medical condition, retirement, loss of his friends over the past year, taking care of his wife with medical condition.  Will continue current dose of sertraline at this time for depression.  Will continue doxepin for insomnia/depression. Noted that although he reports snoring, daytime fatigue and insomnia, he is not interested in pursuing sleep evaluation.    Plan Continue sertraline 50 mg daily Continue doxepin 50 mg at night (monitor dry mouth) Next appointment- 3/1 at 8:40 for 20 mins, video (will call home phone if video does not work)  - on lorazepam 2 mg BID by primary care doctor - he is not interested in therapy at this time   Past trials of medication: sinequan (doxepin), fluoxetine, lexapro (perceived side effect of  nightmares), mirtazapine (nightmares), bupropion, Ambien, Trazodone,    The patient demonstrates the following risk factors for suicide: Chronic risk factors for  suicide include: psychiatric disorder of depression and medical illness of COPD, thyroid cancer. Acute risk factors for suicide include: unemployment and loss (financial, interpersonal, professional). Protective factors for this patient include: positive social support, coping skills and hope for the future. Considering these factors, the overall suicide risk at this point appears to be low. Patient is appropriate for outpatient follow up.      [name], MD 10/14/2021, 12:08 PM

## 2021-10-14 ENCOUNTER — Other Ambulatory Visit: Payer: Self-pay

## 2021-10-14 ENCOUNTER — Encounter: Payer: Self-pay | Admitting: Psychiatry

## 2021-10-14 ENCOUNTER — Telehealth (INDEPENDENT_AMBULATORY_CARE_PROVIDER_SITE_OTHER): Payer: Medicare HMO | Admitting: Psychiatry

## 2021-10-14 DIAGNOSIS — G4700 Insomnia, unspecified: Secondary | ICD-10-CM

## 2021-10-14 DIAGNOSIS — F330 Major depressive disorder, recurrent, mild: Secondary | ICD-10-CM

## 2021-10-14 MED ORDER — DOXEPIN HCL 50 MG PO CAPS: 50.0000 mg | ORAL_CAPSULE | Freq: Every day | ORAL | 0 refills | Status: DC

## 2021-10-14 MED ORDER — SERTRALINE HCL 50 MG PO TABS: 50.0000 mg | ORAL_TABLET | Freq: Every day | ORAL | 0 refills | Status: DC

## 2021-10-14 NOTE — Patient Instructions (Signed)
Continue sertraline 50 mg daily Continue doxepin 50 mg at night  Next appointment- 3/1 at 8:40

## 2021-12-07 NOTE — Progress Notes (Signed)
Virtual Visit via Video Note  I connected with Jose Burch on 12/09/21 at  8:40 AM EST by a video enabled telemedicine application and verified that I am speaking with the correct person using two identifiers.  Location: Patient: home Provider: office Persons participated in the visit- patient, provider    I discussed the limitations of evaluation and management by telemedicine and the availability of in person appointments. The patient expressed understanding and agreed to proceed.   I discussed the assessment and treatment plan with the patient. The patient was provided an opportunity to ask questions and all were answered. The patient agreed with the plan and demonstrated an understanding of the instructions.   The patient was advised to call back or seek an in-person evaluation if the symptoms worsen or if the condition fails to improve as anticipated.  I provided 15 minutes of non-face-to-face time during this encounter.   [name], MD    [facility] MD/PA/NP OP Progress Note  12/09/2021 9:05 AM Jose Burch  MRN:  [id number]  Chief Complaint:  Chief Complaint  Patient presents with   Follow-up   Depression   HPI:  This is a follow-up appointment for depression and insomnia.  He states that he has pain due to psoriasis.  He also gets treatment for his skin condition.  It comes out after he gets treatment.  Although he has been trying to deal with it, it has been "pain (emotionally.)"  His ex-wife passed away the other day.  He did not get along with her.  Although he did not want to go to the funeral, he went there as he was asked by his grandchild.  He is trying to help his children.  He occasionally has flashback of good memory when he was in [place] and in [place].  He enjoys taking a walk with his dog, and getting out.  He may go to [employer] to talk with people.  He tries to push himself to do something.  He believes he will be better if his skin condition were to be  better.  Although he has insomnia, he attributes to nocturia.  He thinks he will sleep through the night if he does not have nocturia.  He denies change in appetite.  He feels depressed half of the time.  He denies SI.  He feels scared at times.  He is willing to try higher dose of sertraline at this time.    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 Divorced before (his ex-wife died in 01-12-22)  Visit Diagnosis:    ICD-10-CM   1. MDD (major depressive disorder), recurrent episode, mild ([place])  F33.0     2. Insomnia, unspecified type  G47.00       Past Psychiatric History: Please see initial evaluation for full details. I have reviewed the history. No updates at this time.     Past Medical History:  Past Medical History:  Diagnosis Date   Asthma    Cancer ([place])    Thyroid   COPD (chronic obstructive pulmonary disease) ([place])    Hypertension    Insomnia    Thyroid disease    Tobacco abuse     Past Surgical History:  Procedure Laterality Date   skin cancer removed     THYROIDECTOMY  Jan 12, 2014    Family Psychiatric History: Please see initial evaluation for full details. I have reviewed the history.  No updates at this time.     Family History:  Family History  Problem Relation Age of Onset   Cancer Mother    Asthma Mother    Heart attack Sister    Allergic rhinitis Neg Hx    Eczema Neg Hx     Social History:  Social History   Socioeconomic History   Marital status: Married    Spouse name: Not on file   Number of children: Not on file   Years of education: Not on file   Highest education level: Not on file  Occupational History   Not on file  Tobacco Use   Smoking status: Every Day    Packs/day: 0.50    Types: Cigarettes   Smokeless tobacco: Never  Vaping Use   Vaping Use: Never used  Substance and Sexual Activity   Alcohol use: No   Drug use: No   Sexual activity: Never     Partners: Female  Other Topics Concern   Not on file  Social History Narrative   Not on file   Social Determinants of Health   Financial Resource Strain: Not on file  Food Insecurity: Not on file  Transportation Needs: Not on file  Physical Activity: Not on file  Stress: Not on file  Social Connections: Not on file    Allergies:  Allergies  Allergen Reactions   Aspirin Hives    Metabolic Disorder Labs: No results found for: HGBA1C, MPG No results found for: PROLACTIN No results found for: CHOL, TRIG, HDL, CHOLHDL, VLDL, LDLCALC Lab Results  Component Value Date   TSH 5.14 (H) 03/27/2020   TSH 0.158 (L) 05/05/2019    Therapeutic Level Labs: No results found for: LITHIUM No results found for: VALPROATE No components found for:  CBMZ  Current Medications: Current Outpatient Medications  Medication Sig Dispense Refill   albuterol (VENTOLIN HFA) 108 (90 Base) MCG/ACT inhaler Inhale 2 puffs into the lungs every 6 (six) hours as needed for wheezing or shortness of breath. 18 g 1   amLODipine (NORVASC) 10 MG tablet Take 10 mg by mouth daily.     calcitRIOL (ROCALTROL) 0.5 MCG capsule TAKE 1 CAPSULE (0.5 MCG TOTAL) BY MOUTH DAILY. 90 capsule 0   calcium carbonate (TUMS EX) 750 MG chewable tablet Chew 1 tablet (750 mg total) by mouth 3 (three) times daily with meals. 180 tablet 3   diphenhydrAMINE (BENADRYL) 25 mg capsule Take 25 mg by mouth daily.     doxepin (SINEQUAN) 50 MG capsule Take 1 capsule (50 mg total) by mouth at bedtime. 90 capsule 0   hydrochlorothiazide (HYDRODIURIL) 12.5 MG tablet Take 12.5 mg by mouth daily.     ipratropium-albuterol (DUONEB) 0.5-2.5 (3) MG/3ML SOLN Take 3 mLs by nebulization 2 (two) times daily. 180 mL 5   levothyroxine (SYNTHROID) 150 MCG tablet Take 150 mcg by mouth daily.     levothyroxine (SYNTHROID) 175 MCG tablet Take 1 tablet (175 mcg total) by mouth daily before breakfast. 90 tablet 1   LORazepam (ATIVAN) 2 MG tablet Take 2 mg by  mouth 2 (two) times daily as needed.     losartan (COZAAR) 100 MG tablet Take 1 tablet (100 mg total) by mouth daily. 90 tablet 3   potassium chloride SA (KLOR-CON) 20 MEQ tablet Take 1 tablet (20 mEq total) by mouth daily. 90 tablet 3   predniSONE (DELTASONE) 20 MG tablet Take 1 tablet (20 mg total) by mouth 2 (two) times daily with a meal. 6 tablet  0   sertraline (ZOLOFT) 50 MG tablet Take 1 tablet (50 mg total) by mouth at bedtime. 90 tablet 0   Tiotropium Bromide Monohydrate (SPIRIVA RESPIMAT) 2.5 MCG/ACT AERS Inhale 1 spray into the lungs daily. 12 g 0   UNKNOWN TO PATIENT Allergy injection every 2 weeks     Current Facility-Administered Medications  Medication Dose Route Frequency Provider Last Rate Last Admin   Benralizumab SOSY 30 mg  30 mg Subcutaneous Q28 days Valentina Shaggy, MD   30 mg at 06/25/20 1041     Musculoskeletal: Strength & Muscle Tone:  N/A Gait & Station:  N/A Patient leans: N/A  Psychiatric Specialty Exam: Review of Systems  Psychiatric/Behavioral:  Positive for decreased concentration, dysphoric mood and sleep disturbance. Negative for agitation, behavioral problems, confusion, hallucinations, self-injury and suicidal ideas. The patient is nervous/anxious. The patient is not hyperactive.   All other systems reviewed and are negative.  There were no vitals taken for this visit.There is no height or weight on file to calculate BMI.  General Appearance: Fairly Groomed  Eye Contact:  Good  Speech:  Clear and Coherent  Volume:  Normal  Mood:   "frustrated"  Affect:  Appropriate, Congruent, and down  Thought Process:  Coherent  Orientation:  Full (Time, Place, and Person)  Thought Content: Logical   Suicidal Thoughts:  No  Homicidal Thoughts:  No  Memory:  Immediate;   Good  Judgement:  Good  Insight:  Good  Psychomotor Activity:  Normal  Concentration:  Concentration: Good and Attention Span: Good  Recall:  Good  Fund of Knowledge: Good  Language:  Good  Akathisia:  No  Handed:  Right  AIMS (if indicated): not done  Assets:  Communication Skills Desire for Improvement  ADL's:  Intact  Cognition: WNL  Sleep:  Fair   Screenings: PHQ2-9    [place] Office Visit from 06/08/2021 in [place] Visit from 02/13/2018 in [facility] Office Visit from 08/15/2017 in [facility] Office Visit from 02/10/2017 in [facility] Office Visit from 01/13/2017 in [facility]  PHQ-2 Total Score 3 0 0 0 0  PHQ-9 Total Score 9 -- -- -- --      [place] [facility] from 05/08/2019 in [place] No Risk        Assessment and Plan:  Jose Burch is a 71 y.o. year old male with a history of depression, insomnia, malignant neoplasm of thyroid gland s/p thyroidectomy in 2015. ablation , postsurgical hypothyroidism, hypocalcemia, COPD, who presents for follow up appointment for below.    1. MDD (major depressive disorder), recurrent episode, mild ([place]) Although there has been more improvement in depressive symptoms since up titration of sertraline, he continues to report anxiety.  Recent psychosocial stressors includes treatment of his skin condition, pain secondary to psoriasis, and loss of his ex-wife. Other psychosocial stressors includes demoralization due to his medical condition, retirement, loss of his friends over the past year, taking care of his wife with medical condition.  Will do slow up titration of sertraline to optimize treatment for depression.  Discussed potential risk of drowsiness and GI symptoms.   2. Insomnia, unspecified type Improving.  Will continue current dose of doxepin for insomnia. Noted that although he reports snoring, daytime fatigue and insomnia, he is not interested in pursuing sleep evaluation.    Plan Increase sertraline 75 mg daily (he will contact the  office if  he needs refills) Continue doxepin 50 mg at night (monitor dry mouth) Next appointment- 3/28 at 1:40 for 20 mins, video (will call home phone if video does not work)  - on lorazepam 2 mg BID by primary care doctor - he is not interested in therapy at this time   Past trials of medication: sinequan (doxepin), fluoxetine, lexapro (perceived side effect of nightmares), mirtazapine (nightmares), bupropion, Ambien, Trazodone,    The patient demonstrates the following risk factors for suicide: Chronic risk factors for suicide include: psychiatric disorder of depression and medical illness of COPD, thyroid cancer. Acute risk factors for suicide include: unemployment and loss (financial, interpersonal, professional). Protective factors for this patient include: positive social support, coping skills and hope for the future. Considering these factors, the overall suicide risk at this point appears to be low. Patient is appropriate for outpatient follow up.       Collaboration of Care: Collaboration of Care: Other N/A  Patient/Guardian was advised Release of Information must be obtained prior to any record release in order to collaborate their care with an outside provider. Patient/Guardian was advised if they have not already done so to contact the registration department to sign all necessary forms in order for [place] to release information regarding their care.   Consent: Patient/Guardian gives verbal consent for treatment and assignment of benefits for services provided during this visit. Patient/Guardian expressed understanding and agreed to proceed.    [name], MD 12/09/2021, 9:05 AM

## 2021-12-09 ENCOUNTER — Encounter: Payer: Self-pay | Admitting: Psychiatry

## 2021-12-09 ENCOUNTER — Telehealth (INDEPENDENT_AMBULATORY_CARE_PROVIDER_SITE_OTHER): Payer: Medicare HMO | Admitting: Psychiatry

## 2021-12-09 ENCOUNTER — Other Ambulatory Visit: Payer: Self-pay

## 2021-12-09 DIAGNOSIS — G4700 Insomnia, unspecified: Secondary | ICD-10-CM

## 2021-12-09 DIAGNOSIS — F330 Major depressive disorder, recurrent, mild: Secondary | ICD-10-CM

## 2021-12-09 NOTE — Patient Instructions (Signed)
Increase sertraline 75 mg daily  ?Continue doxepin 50 mg at night  ?Next appointment- 3/28 at 1:40, video ?

## 2021-12-23 ENCOUNTER — Other Ambulatory Visit: Payer: Self-pay | Admitting: Psychiatry

## 2022-01-07 NOTE — Progress Notes (Addendum)
Virtual Visit via Video Note ? ?I connected with Jose Burch on 01/11/22 at  1:40 PM EDT by a video enabled telemedicine application and verified that I am speaking with the correct person using two identifiers. ? ?Location: ?Patient: home ?Provider: office ?Persons participated in the visit- patient, provider  ?  ?I discussed the limitations of evaluation and management by telemedicine and the availability of in person appointments. The patient expressed understanding and agreed to proceed. ? ?  ?I discussed the assessment and treatment plan with the patient. The patient was provided an opportunity to ask questions and all were answered. The patient agreed with the plan and demonstrated an understanding of the instructions. ?  ?The patient was advised to call back or seek an in-person evaluation if the symptoms worsen or if the condition fails to improve as anticipated. ? ?I provided 15 minutes of non-face-to-face time during this encounter. ? ? ?[name], MD ? ? ? ?[facility] MD/PA/NP OP Progress Note ? ?01/11/2022 2:16 PM ?Jose Burch  ?MRN:  [id number] ? ?Chief Complaint:  ?Chief Complaint  ?Patient presents with  ? Follow-up  ? Depression  ? ?HPI:  ?This is a follow-up appointment for depression and anxiety.  ?He states that he is having good days and bad days.  He is thinking of seeing another dermatologist for psoriasis; he is suffering from pain and pruritus.  He has been able to go outside more frequently, and he enjoys being outside.  He feels calmer since on higher dose of sertraline without any side effect.  However, he feels down due to recent loss of his daughter's girlfriend, and loss of the mother of his son.  He hates seeing other people crying.  He sleeps 3 to 4 hours.  He has good appetite.  He denies SI.  He denies panic attacks.  He asks if doxepin could be filled.  According to the chart, this medication should last until May.  After having a conversation at length, he states that he was taking  2 tabs of doxepin.  He agrees to take the medication as advised moving forward.  ? 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 Divorced before (his ex-wife died in 01-09-22) ? ?Visit Diagnosis:  ?  ICD-10-CM   ?1. MDD (major depressive disorder), recurrent episode, mild ([place])  F33.0   ?  ?2. Insomnia, unspecified type  G47.00   ?  ? ? ?Past Psychiatric History: Please see initial evaluation for full details. I have reviewed the history. No updates at this time.  ?  ? ?Past Medical History:  ?Past Medical History:  ?Diagnosis Date  ? Asthma   ? Cancer [facility])   ? Thyroid  ? COPD (chronic obstructive pulmonary disease) ([place])   ? Hypertension   ? Insomnia   ? Thyroid disease   ? Tobacco abuse   ?  ?Past Surgical History:  ?Procedure Laterality Date  ? skin cancer removed    ? THYROIDECTOMY  01-09-2014  ? ? ?Family Psychiatric History: Please see initial evaluation for full details. I have reviewed the history. No updates at this time.  ?  ? ?Family History:  ?Family History  ?Problem Relation Age of Onset  ? Cancer Mother   ? Asthma Mother   ? Heart attack Sister   ? Allergic rhinitis Neg Hx   ? Eczema Neg Hx   ? ? ?Social History:  ?Social History  ? ?  Socioeconomic History  ? Marital status: Married  ?  Spouse name: Not on file  ? Number of children: Not on file  ? Years of education: Not on file  ? Highest education level: Not on file  ?Occupational History  ? Not on file  ?Tobacco Use  ? Smoking status: Every Day  ?  Packs/day: 0.50  ?  Types: Cigarettes  ? Smokeless tobacco: Never  ?Vaping Use  ? Vaping Use: Never used  ?Substance and Sexual Activity  ? Alcohol use: No  ? Drug use: No  ? Sexual activity: Never  ?  Partners: Female  ?Other Topics Concern  ? Not on file  ?Social History Narrative  ? Not on file  ? ?Social Determinants of Health  ? ?Financial Resource Strain: Not on file  ?Food Insecurity: Not on file  ?Transportation Needs: Not on  file  ?Physical Activity: Not on file  ?Stress: Not on file  ?Social Connections: Not on file  ? ? ?Allergies:  ?Allergies  ?Allergen Reactions  ? Aspirin Hives  ? ? ?Metabolic Disorder Labs: ?No results found for: HGBA1C, MPG ?No results found for: PROLACTIN ?No results found for: CHOL, TRIG, HDL, CHOLHDL, VLDL, LDLCALC ?Lab Results  ?Component Value Date  ? TSH 5.14 (H) 03/27/2020  ? TSH 0.158 (L) 05/05/2019  ? ? ?Therapeutic Level Labs: ?No results found for: LITHIUM ?No results found for: VALPROATE ?No components found for:  CBMZ ? ?Current Medications: ?Current Outpatient Medications  ?Medication Sig Dispense Refill  ? albuterol (VENTOLIN HFA) 108 (90 Base) MCG/ACT inhaler Inhale 2 puffs into the lungs every 6 (six) hours as needed for wheezing or shortness of breath. 18 g 1  ? amLODipine (NORVASC) 10 MG tablet Take 10 mg by mouth daily.    ? calcitRIOL (ROCALTROL) 0.5 MCG capsule TAKE 1 CAPSULE (0.5 MCG TOTAL) BY MOUTH DAILY. 90 capsule 0  ? calcium carbonate (TUMS EX) 750 MG chewable tablet Chew 1 tablet (750 mg total) by mouth 3 (three) times daily with meals. 180 tablet 3  ? diphenhydrAMINE (BENADRYL) 25 mg capsule Take 25 mg by mouth daily.    ? doxepin (SINEQUAN) 50 MG capsule Take 1 capsule (50 mg total) by mouth at bedtime. 90 capsule 0  ? hydrochlorothiazide (HYDRODIURIL) 12.5 MG tablet Take 12.5 mg by mouth daily.    ? ipratropium-albuterol (DUONEB) 0.5-2.5 (3) MG/3ML SOLN Take 3 mLs by nebulization 2 (two) times daily. 180 mL 5  ? levothyroxine (SYNTHROID) 150 MCG tablet Take 150 mcg by mouth daily.    ? levothyroxine (SYNTHROID) 175 MCG tablet Take 1 tablet (175 mcg total) by mouth daily before breakfast. 90 tablet 1  ? LORazepam (ATIVAN) 2 MG tablet Take 2 mg by mouth 2 (two) times daily as needed.    ? losartan (COZAAR) 100 MG tablet Take 1 tablet (100 mg total) by mouth daily. 90 tablet 3  ? potassium chloride SA (KLOR-CON) 20 MEQ tablet Take 1 tablet (20 mEq total) by mouth daily. 90 tablet 3  ?  predniSONE (DELTASONE) 20 MG tablet Take 1 tablet (20 mg total) by mouth 2 (two) times daily with a meal. 6 tablet 0  ? sertraline (ZOLOFT) 100 MG tablet Take 1 tablet (100 mg total) by mouth at bedtime. 90 tablet 0  ? Tiotropium Bromide Monohydrate (SPIRIVA RESPIMAT) 2.5 MCG/ACT AERS Inhale 1 spray into the lungs daily. 12 g 0  ? UNKNOWN TO PATIENT Allergy injection every 2 weeks    ? ?Current Facility-Administered Medications  ?  Medication Dose Route Frequency Provider Last Rate Last Admin  ? Benralizumab SOSY 30 mg  30 mg Subcutaneous Q28 days Valentina Shaggy, MD   30 mg at 06/25/20 1041  ? ? ? ?Musculoskeletal: ?Strength & Muscle Tone:  N/A ?Gait & Station:  N/A ?Patient leans: N/A ? ?Psychiatric Specialty Exam: ?Review of Systems  ?Psychiatric/Behavioral:  Positive for dysphoric mood and sleep disturbance. Negative for agitation, behavioral problems, confusion, decreased concentration, hallucinations, self-injury and suicidal ideas. The patient is nervous/anxious. The patient is not hyperactive.   ?All other systems reviewed and are negative.  ?There were no vitals taken for this visit.There is no height or weight on file to calculate BMI.  ?General Appearance: Fairly Groomed  ?Eye Contact:  Good  ?Speech:  Clear and Coherent  ?Volume:  Normal  ?Mood:   better  ?Affect:  Appropriate, Congruent, and Full Range  ?Thought Process:  Coherent  ?Orientation:  Full (Time, Place, and Person)  ?Thought Content: Logical   ?Suicidal Thoughts:  No  ?Homicidal Thoughts:  No  ?Memory:  Immediate;   Good  ?Judgement:  Good  ?Insight:  Good  ?Psychomotor Activity:  Normal  ?Concentration:  Concentration: Good and Attention Span: Good  ?Recall:  Good  ?Fund of Knowledge: Good  ?Language: Good  ?Akathisia:  No  ?Handed:  Right  ?AIMS (if indicated): not done  ?Assets:  Communication Skills ?Desire for Improvement  ?ADL's:  Intact  ?Cognition: WNL  ?Sleep:  Poor  ? ?Screenings: ?PHQ2-9   ? ?[facility] Office Visit from  06/08/2021 in [place] Office Visit from 02/13/2018 in [facility] Office Visit from 08/15/2017 in [facility] Office Visit f

## 2022-01-08 ENCOUNTER — Telehealth: Payer: Medicare HMO | Admitting: Psychiatry

## 2022-01-11 ENCOUNTER — Telehealth (INDEPENDENT_AMBULATORY_CARE_PROVIDER_SITE_OTHER): Payer: Medicare HMO | Admitting: Psychiatry

## 2022-01-11 ENCOUNTER — Encounter: Payer: Self-pay | Admitting: Psychiatry

## 2022-01-11 DIAGNOSIS — G4700 Insomnia, unspecified: Secondary | ICD-10-CM

## 2022-01-11 DIAGNOSIS — F330 Major depressive disorder, recurrent, mild: Secondary | ICD-10-CM | POA: Diagnosis not present

## 2022-01-11 MED ORDER — SERTRALINE HCL 100 MG PO TABS: 100.0000 mg | ORAL_TABLET | Freq: Every day | ORAL | 0 refills | Status: DC

## 2022-01-11 MED ORDER — DOXEPIN HCL 50 MG PO CAPS: 50.0000 mg | ORAL_CAPSULE | Freq: Every day | ORAL | 0 refills | Status: DC

## 2022-01-11 NOTE — Patient Instructions (Signed)
Increase sertraline 100 mg daily  ?Continue doxepin 50 mg at night  ?Next appointment-6/27 at 1:20, video ?

## 2022-04-06 ENCOUNTER — Telehealth: Payer: Self-pay

## 2022-04-06 ENCOUNTER — Telehealth (INDEPENDENT_AMBULATORY_CARE_PROVIDER_SITE_OTHER): Payer: Medicare HMO | Admitting: Psychiatry

## 2022-04-06 ENCOUNTER — Encounter: Payer: Self-pay | Admitting: Psychiatry

## 2022-04-06 DIAGNOSIS — F330 Major depressive disorder, recurrent, mild: Secondary | ICD-10-CM

## 2022-04-06 DIAGNOSIS — G4700 Insomnia, unspecified: Secondary | ICD-10-CM

## 2022-04-06 MED ORDER — DOXEPIN HCL 50 MG PO CAPS: 50.0000 mg | ORAL_CAPSULE | Freq: Every day | ORAL | 0 refills | Status: DC

## 2022-04-06 MED ORDER — SERTRALINE HCL 100 MG PO TABS
100.0000 mg | ORAL_TABLET | Freq: Every day | ORAL | 0 refills | Status: DC
Start: 2022-04-12 — End: 2022-04-07

## 2022-04-06 NOTE — Progress Notes (Addendum)
Virtual Visit via Video Note  I connected with Jose Burch on 04/06/22 at  1:40 PM EDT by a video enabled telemedicine application and verified that I am speaking with the correct person using two identifiers.  Location: Patient: home Provider: office Persons participated in the visit- patient, provider    I discussed the limitations of evaluation and management by telemedicine and the availability of in person appointments. The patient expressed understanding and agreed to proceed.    I discussed the assessment and treatment plan with the patient. The patient was provided an opportunity to ask questions and all were answered. The patient agreed with the plan and demonstrated an understanding of the instructions.   The patient was advised to call back or seek an in-person evaluation if the symptoms worsen or if the condition fails to improve as anticipated.  I provided 10 minutes of non-face-to-face time during this encounter.   Jose Clay, MD   [facility] MD/PA/NP OP Progress Note  04/06/2022 2:03 PM Jose Burch  MRN:  [id number]  Chief Complaint:  Chief Complaint  Patient presents with   Follow-up   Depression   HPI:  This is a follow-up appointment for depression and anxiety.  He states that he has been trying to take it 1 day at a time.  He enjoys doing Biomedical scientist.  He has been feeling down at times when he get removal of skin cancer.  Although the procedure went well, he has upcoming surgery a few more times.  He sleeps 4 to 5 hours, and feels content with this.  He has good appetite.  He denies SI.  He discontinued sertraline about a week ago as he was more anxious, and had a panic attack. He felt very scared.  He is willing to restart from lower dose as it was helpful in the past.    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 Divorced before (his ex-wife died in 01/10/2022)  Visit Diagnosis:     ICD-10-CM   1. MDD (major depressive disorder), recurrent episode, mild (Jose Burch)  F33.0     2. Insomnia, unspecified type  G47.00       Past Psychiatric History: Please see initial evaluation for full details. I have reviewed the history. No updates at this time.     Past Medical History:  Past Medical History:  Diagnosis Date   Asthma    Cancer ([place])    Thyroid   COPD (chronic obstructive pulmonary disease) ([place])    Hypertension    Insomnia    Thyroid disease    Tobacco abuse     Past Surgical History:  Procedure Laterality Date   skin cancer removed     THYROIDECTOMY  10-Jan-2014    Family Psychiatric History: Please see initial evaluation for full details. I have reviewed the history. No updates at this time.     Family History:  Family History  Problem Relation Age of Onset   Cancer Mother    Asthma Mother    Heart attack Sister    Allergic rhinitis Neg Hx    Eczema Neg Hx     Social History:  Social History   Socioeconomic History   Marital status: Married    Spouse name: Not on file   Number of children: Not on file   Years of education: Not on file   Highest education level: Not on file  Occupational History  Not on file  Tobacco Use   Smoking status: Every Day    Packs/day: 0.50    Types: Cigarettes   Smokeless tobacco: Never  Vaping Use   Vaping Use: Never used  Substance and Sexual Activity   Alcohol use: No   Drug use: No   Sexual activity: Never    Partners: Female  Other Topics Concern   Not on file  Social History Narrative   Not on file   Social Determinants of Health   Financial Resource Strain: Not on file  Food Insecurity: Not on file  Transportation Needs: Not on file  Physical Activity: Not on file  Stress: Not on file  Social Connections: Not on file    Allergies:  Allergies  Allergen Reactions   Aspirin Hives    Metabolic Disorder Labs: No results found for: "HGBA1C", "MPG" No results found for: "PROLACTIN" No  results found for: "CHOL", "TRIG", "HDL", "CHOLHDL", "VLDL", "LDLCALC" Lab Results  Component Value Date   TSH 5.14 (H) 03/27/2020   TSH 0.158 (L) 05/05/2019    Therapeutic Level Labs: No results found for: "LITHIUM" No results found for: "VALPROATE" No results found for: "CBMZ"  Current Medications: Current Outpatient Medications  Medication Sig Dispense Refill   albuterol (VENTOLIN HFA) 108 (90 Base) MCG/ACT inhaler Inhale 2 puffs into the lungs every 6 (six) hours as needed for wheezing or shortness of breath. 18 g 1   amLODipine (NORVASC) 10 MG tablet Take 10 mg by mouth daily.     calcitRIOL (ROCALTROL) 0.5 MCG capsule TAKE 1 CAPSULE (0.5 MCG TOTAL) BY MOUTH DAILY. 90 capsule 0   calcium carbonate (TUMS EX) 750 MG chewable tablet Chew 1 tablet (750 mg total) by mouth 3 (three) times daily with meals. 180 tablet 3   diphenhydrAMINE (BENADRYL) 25 mg capsule Take 25 mg by mouth daily.     [START ON 04/11/2022] doxepin (SINEQUAN) 50 MG capsule Take 1 capsule (50 mg total) by mouth at bedtime. 90 capsule 0   hydrochlorothiazide (HYDRODIURIL) 12.5 MG tablet Take 12.5 mg by mouth daily.     ipratropium-albuterol (DUONEB) 0.5-2.5 (3) MG/3ML SOLN Take 3 mLs by nebulization 2 (two) times daily. 180 mL 5   levothyroxine (SYNTHROID) 150 MCG tablet Take 150 mcg by mouth daily.     levothyroxine (SYNTHROID) 175 MCG tablet Take 1 tablet (175 mcg total) by mouth daily before breakfast. 90 tablet 1   LORazepam (ATIVAN) 2 MG tablet Take 2 mg by mouth 2 (two) times daily as needed.     losartan (COZAAR) 100 MG tablet Take 1 tablet (100 mg total) by mouth daily. 90 tablet 3   potassium chloride SA (KLOR-CON) 20 MEQ tablet Take 1 tablet (20 mEq total) by mouth daily. 90 tablet 3   predniSONE (DELTASONE) 20 MG tablet Take 1 tablet (20 mg total) by mouth 2 (two) times daily with a meal. 6 tablet 0   [START ON 04/12/2022] sertraline (ZOLOFT) 100 MG tablet Take 1 tablet (100 mg total) by mouth at bedtime. 90  tablet 0   Tiotropium Bromide Monohydrate (SPIRIVA RESPIMAT) 2.5 MCG/ACT AERS Inhale 1 spray into the lungs daily. 12 g 0   UNKNOWN TO PATIENT Allergy injection every 2 weeks     Current Facility-Administered Medications  Medication Dose Route Frequency Provider Last Rate Last Admin   Benralizumab SOSY 30 mg  30 mg Subcutaneous Q28 days Valentina Shaggy, MD   30 mg at 06/25/20 1041     Musculoskeletal: Strength &  Muscle Tone:  N/A Gait & Station:  N/A Patient leans: N/A  Psychiatric Specialty Exam: Review of Systems  Psychiatric/Behavioral:  Positive for dysphoric mood and sleep disturbance. Negative for agitation, behavioral problems, confusion, decreased concentration, hallucinations, self-injury and suicidal ideas. The patient is nervous/anxious. The patient is not hyperactive.   All other systems reviewed and are negative.   There were no vitals taken for this visit.There is no height or weight on file to calculate BMI.  General Appearance: Fairly Groomed  Eye Contact:  Good  Speech:   normal  Volume:  Normal  Mood:   good  Affect:  Appropriate, Congruent, and calm  Thought Process:  Coherent  Orientation:  Full (Time, Place, and Person)  Thought Content: Logical   Suicidal Thoughts:  No  Homicidal Thoughts:  No  Memory:  Immediate;   Good  Judgement:  Good  Insight:  Good  Psychomotor Activity:  Normal  Concentration:  Concentration: Good and Attention Span: Good  Recall:  Good  Fund of Knowledge: Good  Language: Good  Akathisia:  No  Handed:  Right  AIMS (if indicated): not done  Assets:  Communication Skills Desire for Improvement  ADL's:  Intact  Cognition: WNL  Sleep:  Fair   Screenings: PHQ2-9    [place] Office Visit from 06/08/2021 in [place] Visit from 02/13/2018 in [facility] Office Visit from 08/15/2017 in [facility] Office Visit from 02/10/2017 in  [facility] Office Visit from 01/13/2017 in [facility]  PHQ-2 Total Score 3 0 0 0 0  PHQ-9 Total Score 9 -- -- -- --      [place] ED from 05/08/2019 in [place] No Risk        Assessment and Plan:  Jose Burch is a 71 y.o. year old male with a history of depression, insomnia, malignant neoplasm of thyroid gland s/p thyroidectomy in 2015. ablation , postsurgical hypothyroidism, hypocalcemia, COPD, who presents for follow up appointment for below.   1. MDD (major depressive disorder), recurrent episode, mild ([facility]) There was worsening in anxiety in the context of taking higher dose of sertraline, although there has been overall improvement in depressive symptoms. Recent psychosocial stressors includes treatment of his skin condition, pain secondary to psoriasis, and loss of his ex-wife. Other psychosocial stressors includes demoralization due to his medical condition, retirement, loss of his friends over the past year, taking care of his wife with medical condition.  Will discontinue sertraline at this time given his reported adverse reaction at higher dose, although he was doing well with the combination of an overdose of doxepin and sertraline.  Will continue doxepin at the current dose to target depression, insomnia.   2. Insomnia, unspecified type Unchanged.  Will continue current dose of doxepin for insomnia. Noted that although he reports snoring, daytime fatigue and insomnia, he is not interested in pursuing sleep evaluation.    Plan Discontinue sertraline (worsening in anxiety at 100 mg) Continue doxepin 50 mg at night  Next appointment-  8/23 at 10 AM for 30 mis, video([phone]. will call home phone if video does not work)  - on lorazepam 2 mg BID by primary care doctor - he is not interested in therapy at this time - he was advised to find a pharmacy in [place] as this Probation officer will not  be able to order medication to the pharmacy in [place] after May 11th.  Past trials of medication: sinequan (doxepin), fluoxetine, lexapro (perceived side effect of nightmares), mirtazapine (nightmares), bupropion, Ambien, Trazodone,    The patient demonstrates the following risk factors for suicide: Chronic risk factors for suicide include: psychiatric disorder of depression and medical illness of COPD, thyroid cancer. Acute risk factors for suicide include: unemployment and loss (financial, interpersonal, professional). Protective factors for this patient include: positive social support, coping skills and hope for the future. Considering these factors, the overall suicide risk at this point appears to be low. Patient is appropriate for outpatient follow up.        Collaboration of Care: Collaboration of Care: Other N/A  Patient/Guardian was advised Release of Information must be obtained prior to any record release in order to collaborate their care with an outside provider. Patient/Guardian was advised if they have not already done so to contact the registration department to sign all necessary forms in order for [place] to release information regarding their care.   Consent: Patient/Guardian gives verbal consent for treatment and assignment of benefits for services provided during this visit. Patient/Guardian expressed understanding and agreed to proceed.    Jose Clay, MD 04/06/2022, 2:03 PM

## 2022-04-06 NOTE — Patient Instructions (Addendum)
Discontinue sertraline  Continue doxepin 50 mg at night  Next appointment-  8/23 at 10 AM

## 2022-04-06 NOTE — Telephone Encounter (Addendum)
Addendum: Discussed with the patient. He believes he is doing fine only with taking doxepin, which has been quite beneficial for insomnia. Will hold sertraline at this time. Contacted the pharmacy to cancel the order.

## 2022-04-07 NOTE — Addendum Note (Signed)
Addended by: [name] on: 04/07/2022 10:22 AM   Modules accepted: Orders

## 2022-04-08 ENCOUNTER — Other Ambulatory Visit: Payer: Self-pay | Admitting: Psychiatry

## 2022-06-01 NOTE — Progress Notes (Unsigned)
Virtual Visit via Video Note  I connected with Jose Burch on 06/02/22 at 10:00 AM EDT by a video enabled telemedicine application and verified that I am speaking with the correct person using two identifiers.  Location: Patient: home Provider: office Persons participated in the visit- patient, provider    I discussed the limitations of evaluation and management by telemedicine and the availability of in person appointments. The patient expressed understanding and agreed to proceed.   I discussed the assessment and treatment plan with the patient. The patient was provided an opportunity to ask questions and all were answered. The patient agreed with the plan and demonstrated an understanding of the instructions.   The patient was advised to call back or seek an in-person evaluation if the symptoms worsen or if the condition fails to improve as anticipated.  I provided 15 minutes of non-face-to-face time during this encounter.   Jose Clay, MD    [facility] MD/PA/NP OP Progress Note  06/02/2022 10:31 AM Jose Burch  MRN:  [id number]  Chief Complaint:  Chief Complaint  Patient presents with   Follow-up   Depression   HPI:  This is a follow-up appointment for depression and anxiety.  He states that he is doing fine.  He mows grasses, and enjoys taking care of his dog.  He thinks his mood has been up and down.  He tries not to watch TV as much as he feels stressed.  He will have another procedure for his skin treatment.  He sleeps 4 hours.  His wife tells him that he snores loudly.  However, he does not think he can use a mask.  He may be interested in nasal cannula.  He denies change in appetite.  He adamantly denies any SI as his wife needs him.  He feels anxious at times.  He denies panic attacks.  He takes doxepin regularly.  He wants to try higher dose of doxepin at this time.   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 Divorced before (his ex-wife died in December 28, 2021)   Visit Diagnosis:    ICD-10-CM   1. MDD (major depressive disorder), recurrent episode, mild ([place])  F33.0     2. Insomnia, unspecified type  G47.00       Past Psychiatric History: Please see initial evaluation for full details. I have reviewed the history. No updates at this time.     Past Medical History:  Past Medical History:  Diagnosis Date   Asthma    Cancer ([place])    Thyroid   COPD (chronic obstructive pulmonary disease) ([place])    Hypertension    Insomnia    Thyroid disease    Tobacco abuse     Past Surgical History:  Procedure Laterality Date   skin cancer removed     THYROIDECTOMY  28-Dec-2013    Family Psychiatric History: Please see initial evaluation for full details. I have reviewed the history. No updates at this time.     Family History:  Family History  Problem Relation Age of Onset   Cancer Mother    Asthma Mother    Heart attack Sister    Allergic rhinitis Neg Hx    Eczema Neg Hx     Social History:  Social History   Socioeconomic History   Marital status: Married    Spouse name: Not on file   Number of children: Not on file  Years of education: Not on file   Highest education level: Not on file  Occupational History   Not on file  Tobacco Use   Smoking status: Every Day    Packs/day: 0.50    Types: Cigarettes   Smokeless tobacco: Never  Vaping Use   Vaping Use: Never used  Substance and Sexual Activity   Alcohol use: No   Drug use: No   Sexual activity: Never    Partners: Female  Other Topics Concern   Not on file  Social History Narrative   Not on file   Social Determinants of Health   Financial Resource Strain: Not on file  Food Insecurity: Not on file  Transportation Needs: Not on file  Physical Activity: Not on file  Stress: Not on file  Social Connections: Not on file    Allergies:  Allergies  Allergen Reactions   Aspirin Hives    Metabolic Disorder  Labs: No results found for: "HGBA1C", "MPG" No results found for: "PROLACTIN" No results found for: "CHOL", "TRIG", "HDL", "CHOLHDL", "VLDL", "LDLCALC" Lab Results  Component Value Date   TSH 5.14 (H) 03/27/2020   TSH 0.158 (L) 05/05/2019    Therapeutic Level Labs: No results found for: "LITHIUM" No results found for: "VALPROATE" No results found for: "CBMZ"  Current Medications: Current Outpatient Medications  Medication Sig Dispense Refill   doxepin (SINEQUAN) 75 MG capsule Take 1 capsule (75 mg total) by mouth at bedtime. 90 capsule 0   albuterol (VENTOLIN HFA) 108 (90 Base) MCG/ACT inhaler Inhale 2 puffs into the lungs every 6 (six) hours as needed for wheezing or shortness of breath. 18 g 1   amLODipine (NORVASC) 10 MG tablet Take 10 mg by mouth daily.     calcitRIOL (ROCALTROL) 0.5 MCG capsule TAKE 1 CAPSULE (0.5 MCG TOTAL) BY MOUTH DAILY. 90 capsule 0   calcium carbonate (TUMS EX) 750 MG chewable tablet Chew 1 tablet (750 mg total) by mouth 3 (three) times daily with meals. 180 tablet 3   diphenhydrAMINE (BENADRYL) 25 mg capsule Take 25 mg by mouth daily.     hydrochlorothiazide (HYDRODIURIL) 12.5 MG tablet Take 12.5 mg by mouth daily.     ipratropium-albuterol (DUONEB) 0.5-2.5 (3) MG/3ML SOLN Take 3 mLs by nebulization 2 (two) times daily. 180 mL 5   levothyroxine (SYNTHROID) 150 MCG tablet Take 150 mcg by mouth daily.     levothyroxine (SYNTHROID) 175 MCG tablet Take 1 tablet (175 mcg total) by mouth daily before breakfast. 90 tablet 1   LORazepam (ATIVAN) 2 MG tablet Take 2 mg by mouth 2 (two) times daily as needed.     losartan (COZAAR) 100 MG tablet Take 1 tablet (100 mg total) by mouth daily. 90 tablet 3   potassium chloride SA (KLOR-CON) 20 MEQ tablet Take 1 tablet (20 mEq total) by mouth daily. 90 tablet 3   predniSONE (DELTASONE) 20 MG tablet Take 1 tablet (20 mg total) by mouth 2 (two) times daily with a meal. 6 tablet 0   Tiotropium Bromide Monohydrate (SPIRIVA  RESPIMAT) 2.5 MCG/ACT AERS Inhale 1 spray into the lungs daily. 12 g 0   UNKNOWN TO PATIENT Allergy injection every 2 weeks     Current Facility-Administered Medications  Medication Dose Route Frequency Provider Last Rate Last Admin   Benralizumab SOSY 30 mg  30 mg Subcutaneous Q28 days Valentina Shaggy, MD   30 mg at 06/25/20 1041     Musculoskeletal: Strength & Muscle Tone:  N/A Gait & Station:  N/A Patient leans: N/A  Psychiatric Specialty Exam: Review of Systems  Psychiatric/Behavioral:  Positive for dysphoric mood and sleep disturbance. Negative for agitation, behavioral problems, confusion, decreased concentration, hallucinations, self-injury and suicidal ideas. The patient is nervous/anxious. The patient is not hyperactive.   All other systems reviewed and are negative.   There were no vitals taken for this visit.There is no height or weight on file to calculate BMI.  General Appearance: Fairly Groomed  Eye Contact:  Good  Speech:  Clear and Coherent  Volume:  Normal  Mood:   good days and bad days  Affect:  Appropriate, Congruent, and slightly down  Thought Process:  Coherent  Orientation:  Full (Time, Place, and Person)  Thought Content: Logical   Suicidal Thoughts:  No  Homicidal Thoughts:  No  Memory:  Immediate;   Good  Judgement:  Good  Insight:  Good  Psychomotor Activity:  Normal  Concentration:  Concentration: Good and Attention Span: Good  Recall:  Good  Fund of Knowledge: Good  Language: Good  Akathisia:  No  Handed:  Right  AIMS (if indicated): not done  Assets:  Communication Skills Desire for Improvement  ADL's:  Intact  Cognition: WNL  Sleep:  Poor   Screenings: PHQ2-9    Flowsheet Row Office Visit from 06/08/2021 in [place] Visit from 02/13/2018 in [facility] Office Visit from 08/15/2017 in [facility] Office Visit from 02/10/2017 in [facility] Visit from 01/13/2017 in [facility]  PHQ-2 Total Score 3 0 0 0 0  PHQ-9 Total Score 9 -- -- -- --      Flowsheet Row ED from 05/08/2019 in [place] No Risk        Assessment and Plan:  ESPEN BETHEL is a 71 y.o. year old male with a history of depression, insomnia, malignant neoplasm of thyroid gland s/p thyroidectomy in 2015. ablation , postsurgical hypothyroidism, hypocalcemia, COPD, who presents for follow up appointment for below.   1. MDD (major depressive disorder), recurrent episode, mild ([place]) He continues to report occasional depressive symptoms and anxiety since the last visit.  Psychosocial stressors includes treatment of his skin condition, pain secondary to psoriasis, and loss of his ex-wife. Other psychosocial stressors includes demoralization due to his medical condition, retirement, loss of his friends over the past year, taking care of his wife with medical condition.  Sertraline was discontinued at the last visit due to adverse reaction of worsening in anxiety.  Will uptitrate doxepin to optimize treatment for depression, anxiety and insomnia.  Discussed potential risk of drowsiness.  2. Insomnia, unspecified type Although he has middle insomnia, daytime fatigue and snoring, he is not interested in pursuing sleep evaluation.  However, he agrees to discuss this with his pulmonologist.    Plan Increase doxepin 75 mg at night Next appointment-  10/18 at 2 PM for 30 mis, video([phone]. will call home phone if video does not work)  - on lorazepam 2 mg BID by primary care doctor - he is not interested in therapy at this time   Past trials of medication: sertraline (anxiety at 100 mg),  sinequan (doxepin), fluoxetine, lexapro (perceived side effect of nightmares), mirtazapine (nightmares), bupropion, Ambien, Trazodone,    The patient demonstrates the following risk factors  for suicide: Chronic risk factors for suicide include: psychiatric disorder of depression and medical illness of COPD, thyroid cancer. Acute risk factors for suicide include: unemployment  and loss (financial, interpersonal, professional). Protective factors for this patient include: positive social support, coping skills and hope for the future. Considering these factors, the overall suicide risk at this point appears to be low. Patient is appropriate for outpatient follow up.         Collaboration of Care: Collaboration of Care: Other N/A  Patient/Guardian was advised Release of Information must be obtained prior to any record release in order to collaborate their care with an outside provider. Patient/Guardian was advised if they have not already done so to contact the registration department to sign all necessary forms in order for [place] to release information regarding their care.   Consent: Patient/Guardian gives verbal consent for treatment and assignment of benefits for services provided during this visit. Patient/Guardian expressed understanding and agreed to proceed.    Jose Clay, MD 06/02/2022, 10:31 AM

## 2022-06-02 ENCOUNTER — Telehealth (INDEPENDENT_AMBULATORY_CARE_PROVIDER_SITE_OTHER): Payer: Medicare HMO | Admitting: Psychiatry

## 2022-06-02 ENCOUNTER — Encounter: Payer: Self-pay | Admitting: Psychiatry

## 2022-06-02 DIAGNOSIS — F330 Major depressive disorder, recurrent, mild: Secondary | ICD-10-CM

## 2022-06-02 DIAGNOSIS — G4700 Insomnia, unspecified: Secondary | ICD-10-CM | POA: Diagnosis not present

## 2022-06-02 MED ORDER — DOXEPIN HCL 75 MG PO CAPS
75.0000 mg | ORAL_CAPSULE | Freq: Every day | ORAL | 0 refills | Status: DC
Start: 2022-06-02 — End: 2022-07-28

## 2022-06-02 NOTE — Patient Instructions (Signed)
Increase doxepin 75 mg at night Next appointment-  10/18 at 2 PM

## 2022-07-07 ENCOUNTER — Telehealth: Payer: Medicare HMO | Admitting: Psychiatry

## 2022-07-27 NOTE — Progress Notes (Unsigned)
Virtual Visit via Video Note  I connected with Jose Burch on 07/28/22 at  2:00 PM EDT by a video enabled telemedicine application and verified that I am speaking with the correct person using two identifiers.  Location: Patient: home Provider: office Persons participated in the visit- patient, provider    I discussed the limitations of evaluation and management by telemedicine and the availability of in person appointments. The patient expressed understanding and agreed to proceed.     I discussed the assessment and treatment plan with the patient. The patient was provided an opportunity to ask questions and all were answered. The patient agreed with the plan and demonstrated an understanding of the instructions.   The patient was advised to call back or seek an in-person evaluation if the symptoms worsen or if the condition fails to improve as anticipated.  I provided 10 minutes of non-face-to-face time during this encounter.   [name], MD    [facility] MD/PA/NP OP Progress Note  07/28/2022 2:32 PM GEOFFERY AULTMAN  MRN:  [id number]  Chief Complaint:  Chief Complaint  Patient presents with   Follow-up   Depression   HPI:  This is a follow-up appointment for depression and insomnia.  He states that he has been doing good.  He mows grass.  He celebrated 23 th wedding anniversary with his wife.  He also celebrated her birthday was the entire family.  It was wonderful.  Although he feels anxious and down when he watches news, he has been doing good otherwise.  He sleeps up to 5 hours.  Although he sleeps better with doxepin, he feels drunk in the morning since uptitration.  He wants to lower the dose.  He denies SI.  He denies alcohol use, drug use.  He continues to take lorazepam, prescribed by his PCP.  He denies any fall or dizziness.  He agrees to try to limit its use.   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 Divorced before (his ex-wife died in January 07, 2022  Visit Diagnosis:    ICD-10-CM   1. MDD (major depressive disorder), recurrent, in partial remission ([place])  F33.41     2. Insomnia, unspecified type  G47.00       Past Psychiatric History: Please see initial evaluation for full details. I have reviewed the history. No updates at this time.    Past Medical History:  Past Medical History:  Diagnosis Date   Asthma    Cancer ([place])    Thyroid   COPD (chronic obstructive pulmonary disease) ([place])    Hypertension    Insomnia    Thyroid disease    Tobacco abuse     Past Surgical History:  Procedure Laterality Date   skin cancer removed     THYROIDECTOMY  01-07-2014    Family Psychiatric History: Please see initial evaluation for full details. I have reviewed the history. No updates at this time.     Family History:  Family History  Problem Relation Age of Onset   Cancer Mother    Asthma Mother    Heart attack Sister    Allergic rhinitis Neg Hx    Eczema Neg Hx     Social History:  Social History   Socioeconomic History   Marital status: Married    Spouse name: Not on file   Number of children: Not on file   Years of education: Not on file  Highest education level: Not on file  Occupational History   Not on file  Tobacco Use   Smoking status: Every Day    Packs/day: 0.50    Types: Cigarettes   Smokeless tobacco: Never  Vaping Use   Vaping Use: Never used  Substance and Sexual Activity   Alcohol use: No   Drug use: No   Sexual activity: Never    Partners: Female  Other Topics Concern   Not on file  Social History Narrative   Not on file   Social Determinants of Health   Financial Resource Strain: Not on file  Food Insecurity: Not on file  Transportation Needs: Not on file  Physical Activity: Not on file  Stress: Not on file  Social Connections: Not on file    Allergies:  Allergies  Allergen Reactions   Aspirin Hives    Metabolic  Disorder Labs: No results found for: "HGBA1C", "MPG" No results found for: "PROLACTIN" No results found for: "CHOL", "TRIG", "HDL", "CHOLHDL", "VLDL", "LDLCALC" Lab Results  Component Value Date   TSH 5.14 (H) 03/27/2020   TSH 0.158 (L) 05/05/2019    Therapeutic Level Labs: No results found for: "LITHIUM" No results found for: "VALPROATE" No results found for: "CBMZ"  Current Medications: Current Outpatient Medications  Medication Sig Dispense Refill   doxepin (SINEQUAN) 50 MG capsule Take 1 capsule (50 mg total) by mouth at bedtime. 90 capsule 0   albuterol (VENTOLIN HFA) 108 (90 Base) MCG/ACT inhaler Inhale 2 puffs into the lungs every 6 (six) hours as needed for wheezing or shortness of breath. 18 g 1   amLODipine (NORVASC) 10 MG tablet Take 10 mg by mouth daily.     calcitRIOL (ROCALTROL) 0.5 MCG capsule TAKE 1 CAPSULE (0.5 MCG TOTAL) BY MOUTH DAILY. 90 capsule 0   calcium carbonate (TUMS EX) 750 MG chewable tablet Chew 1 tablet (750 mg total) by mouth 3 (three) times daily with meals. 180 tablet 3   diphenhydrAMINE (BENADRYL) 25 mg capsule Take 25 mg by mouth daily.     hydrochlorothiazide (HYDRODIURIL) 12.5 MG tablet Take 12.5 mg by mouth daily.     ipratropium-albuterol (DUONEB) 0.5-2.5 (3) MG/3ML SOLN Take 3 mLs by nebulization 2 (two) times daily. 180 mL 5   levothyroxine (SYNTHROID) 150 MCG tablet Take 150 mcg by mouth daily.     levothyroxine (SYNTHROID) 175 MCG tablet Take 1 tablet (175 mcg total) by mouth daily before breakfast. 90 tablet 1   LORazepam (ATIVAN) 2 MG tablet Take 2 mg by mouth 2 (two) times daily as needed.     losartan (COZAAR) 100 MG tablet Take 1 tablet (100 mg total) by mouth daily. 90 tablet 3   potassium chloride SA (KLOR-CON) 20 MEQ tablet Take 1 tablet (20 mEq total) by mouth daily. 90 tablet 3   predniSONE (DELTASONE) 20 MG tablet Take 1 tablet (20 mg total) by mouth 2 (two) times daily with a meal. 6 tablet 0   Tiotropium Bromide Monohydrate  (SPIRIVA RESPIMAT) 2.5 MCG/ACT AERS Inhale 1 spray into the lungs daily. 12 g 0   UNKNOWN TO PATIENT Allergy injection every 2 weeks     Current Facility-Administered Medications  Medication Dose Route Frequency Provider Last Rate Last Admin   Benralizumab SOSY 30 mg  30 mg Subcutaneous Q28 days Valentina Shaggy, MD   30 mg at 06/25/20 1041     Musculoskeletal: Strength & Muscle Tone:  N/A Gait & Station:  N/A Patient leans: N/A  Psychiatric Specialty  Exam: Review of Systems  Psychiatric/Behavioral:  Negative for agitation, behavioral problems, confusion, decreased concentration, dysphoric mood, hallucinations, self-injury, sleep disturbance and suicidal ideas. The patient is nervous/anxious. The patient is not hyperactive.   All other systems reviewed and are negative.   There were no vitals taken for this visit.There is no height or weight on file to calculate BMI.  General Appearance: Fairly Groomed  Eye Contact:  Good  Speech:  Clear and Coherent  Volume:  Normal  Mood:   good  Affect:  Appropriate, Congruent, and calm  Thought Process:  Coherent  Orientation:  Full (Time, Place, and Person)  Thought Content: Logical   Suicidal Thoughts:  No  Homicidal Thoughts:  No  Memory:  Immediate;   Good  Judgement:  Good  Insight:  Good  Psychomotor Activity:  Normal  Concentration:  Concentration: Good and Attention Span: Good  Recall:  Good  Fund of Knowledge: Good  Language: Good  Akathisia:  No  Handed:  Right  AIMS (if indicated): not done  Assets:  Communication Skills Desire for Improvement  ADL's:  Intact  Cognition: WNL  Sleep:  Good   Screenings: PHQ2-9    Flowsheet Row Office Visit from 06/08/2021 in [place] Visit from 02/13/2018 in [facility] Office Visit from 08/15/2017 in [facility] Office Visit from 02/10/2017 in [facility] Office Visit from  01/13/2017 in [facility]  PHQ-2 Total Score 3 0 0 0 0  PHQ-9 Total Score 9 -- -- -- --      Flowsheet Row ED from 05/08/2019 in [place] No Risk        Assessment and Plan:  TAMARI BUSIC is a 71 y.o. year old male with a history of depression, insomnia, malignant neoplasm of thyroid gland s/p thyroidectomy in 2015. ablation , postsurgical hypothyroidism, hypocalcemia, COPD, who presents for follow up appointment for below.   1. MDD (major depressive disorder), recurrent episode, in partial remission ([place]) There has been overall improvement in depressive symptoms and anxiety since the last visit. Psychosocial stressors includes treatment of his skin condition, pain secondary to psoriasis, and loss of his ex-wife. Other psychosocial stressors includes demoralization due to his medical condition, retirement, loss of his friends over the past year, taking care of his wife with medical condition.  Although he reports some benefit from doxepin, he has adverse reaction of drowsiness from higher dose.  Will taper down the dose to mitigate this side effect.   2. Insomnia, unspecified type It has been overall improving since starting doxepin. Although he has middle insomnia, daytime fatigue and snoring, he is not interested in pursuing sleep evaluation.    Plan Decrease doxepin 50 mg at night Next appointment-  11/22 at 4 PM for 30 mis, video([phone]. will call home phone if video does not work)  - on lorazepam 2 mg BID by primary care doctor - he is not interested in therapy at this time   Past trials of medication: sertraline (anxiety at 100 mg),  sinequan (doxepin), fluoxetine, lexapro (perceived side effect of nightmares), mirtazapine (nightmares), bupropion, Ambien, Trazodone,    The patient demonstrates the following risk factors for suicide: Chronic risk factors for suicide include: psychiatric disorder of depression  and medical illness of COPD, thyroid cancer. Acute risk factors for suicide include: unemployment and loss (financial, interpersonal, professional). Protective factors for this patient include: positive social support, coping skills and hope for the  future. Considering these factors, the overall suicide risk at this point appears to be low. Patient is appropriate for outpatient follow up.          Collaboration of Care: Collaboration of Care: Other reviewed notes in Epic  Patient/Guardian was advised Release of Information must be obtained prior to any record release in order to collaborate their care with an outside provider. Patient/Guardian was advised if they have not already done so to contact the registration department to sign all necessary forms in order for [place] to release information regarding their care.   Consent: Patient/Guardian gives verbal consent for treatment and assignment of benefits for services provided during this visit. Patient/Guardian expressed understanding and agreed to proceed.    [name], MD 07/28/2022, 2:32 PM

## 2022-07-28 ENCOUNTER — Telehealth (INDEPENDENT_AMBULATORY_CARE_PROVIDER_SITE_OTHER): Payer: Medicare HMO | Admitting: Psychiatry

## 2022-07-28 ENCOUNTER — Encounter: Payer: Self-pay | Admitting: Psychiatry

## 2022-07-28 DIAGNOSIS — G4700 Insomnia, unspecified: Secondary | ICD-10-CM

## 2022-07-28 DIAGNOSIS — F3341 Major depressive disorder, recurrent, in partial remission: Secondary | ICD-10-CM | POA: Diagnosis not present

## 2022-07-28 MED ORDER — DOXEPIN HCL 50 MG PO CAPS
50.0000 mg | ORAL_CAPSULE | Freq: Every day | ORAL | 0 refills | Status: DC
Start: 2022-07-28 — End: 2022-10-22

## 2022-08-31 NOTE — Progress Notes (Unsigned)
Virtual Visit via Video Note  I connected with Jose Burch on 09/01/22 at  4:00 PM EST by a video enabled telemedicine application and verified that I am speaking with the correct person using two identifiers.  Location: Patient: home Provider: office Persons participated in the visit- patient, provider    I discussed the limitations of evaluation and management by telemedicine and the availability of in person appointments. The patient expressed understanding and agreed to proceed.    I discussed the assessment and treatment plan with the patient. The patient was provided an opportunity to ask questions and all were answered. The patient agreed with the plan and demonstrated an understanding of the instructions.   The patient was advised to call back or seek an in-person evaluation if the symptoms worsen or if the condition fails to improve as anticipated.  I provided 15 minutes of non-face-to-face time during this encounter.   Jose Clay, MD    [facility] MD/PA/NP OP Progress Note  09/01/2022 4:51 PM Jose Burch  MRN:  [id number]  Chief Complaint:  Chief Complaint  Patient presents with   Follow-up   HPI:  This is a follow-up appointment for depression, insomnia.  He states that he feels ready for tomorrow.  His family including his grandchildren, great grandchildren will come to his place.  His wife will be cooking.  His mood is good until he watches TV.  He feels sad that there is no love in the world, killing with each other.  He reports good connection with his family.  He enjoys doing woods work.  He sleeps 4 to 5 hours.  He has good appetite, and is hoping to lose weight.  Although he feels down at times, he has been able to come out of it.  He denies SI.  He feels less anxious.  He reports that he notices the skin is peeling off for the past few weeks.  It happens only on his arms.  He denies any mouth ulcers, or any other rash in his body.  He verbalized understanding to  contact his PCP if any worsening.    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 Divorced before (his ex-wife died in 2022/01/07  238 lbs  Wt Readings from Last 3 Encounters:  06/08/21 230 lb 12.8 oz (104.7 kg)  04/03/20 234 lb 6.4 oz (106.3 kg)  09/03/19 223 lb (101.2 kg)     Visit Diagnosis:    ICD-10-CM   1. MDD (major depressive disorder), recurrent, in partial remission ([place])  F33.41     2. Insomnia, unspecified type  G47.00       Past Psychiatric History: Please see initial evaluation for full details. I have reviewed the history. No updates at this time.     Past Medical History:  Past Medical History:  Diagnosis Date   Asthma    Cancer ([place])    Thyroid   COPD (chronic obstructive pulmonary disease) ([place])    Hypertension    Insomnia    Thyroid disease    Tobacco abuse     Past Surgical History:  Procedure Laterality Date   skin cancer removed     THYROIDECTOMY  Jan 07, 2014    Family Psychiatric History: Please see initial evaluation for full details. I have reviewed the history. No updates at this time.     Family History:  Family History  Problem Relation Age  of Onset   Cancer Mother    Asthma Mother    Heart attack Sister    Allergic rhinitis Neg Hx    Eczema Neg Hx     Social History:  Social History   Socioeconomic History   Marital status: Married    Spouse name: Not on file   Number of children: Not on file   Years of education: Not on file   Highest education level: Not on file  Occupational History   Not on file  Tobacco Use   Smoking status: Every Day    Packs/day: 0.50    Types: Cigarettes   Smokeless tobacco: Never  Vaping Use   Vaping Use: Never used  Substance and Sexual Activity   Alcohol use: No   Drug use: No   Sexual activity: Never    Partners: Female  Other Topics Concern   Not on file  Social History Narrative   Not on file    Social Determinants of Health   Financial Resource Strain: Not on file  Food Insecurity: Not on file  Transportation Needs: Not on file  Physical Activity: Not on file  Stress: Not on file  Social Connections: Not on file    Allergies:  Allergies  Allergen Reactions   Aspirin Hives    Metabolic Disorder Labs: No results found for: "HGBA1C", "MPG" No results found for: "PROLACTIN" No results found for: "CHOL", "TRIG", "HDL", "CHOLHDL", "VLDL", "LDLCALC" Lab Results  Component Value Date   TSH 5.14 (H) 03/27/2020   TSH 0.158 (L) 05/05/2019    Therapeutic Level Labs: No results found for: "LITHIUM" No results found for: "VALPROATE" No results found for: "CBMZ"  Current Medications: Current Outpatient Medications  Medication Sig Dispense Refill   albuterol (VENTOLIN HFA) 108 (90 Base) MCG/ACT inhaler Inhale 2 puffs into the lungs every 6 (six) hours as needed for wheezing or shortness of breath. 18 g 1   amLODipine (NORVASC) 10 MG tablet Take 10 mg by mouth daily.     calcitRIOL (ROCALTROL) 0.5 MCG capsule TAKE 1 CAPSULE (0.5 MCG TOTAL) BY MOUTH DAILY. 90 capsule 0   calcium carbonate (TUMS EX) 750 MG chewable tablet Chew 1 tablet (750 mg total) by mouth 3 (three) times daily with meals. 180 tablet 3   diphenhydrAMINE (BENADRYL) 25 mg capsule Take 25 mg by mouth daily.     doxepin (SINEQUAN) 50 MG capsule Take 1 capsule (50 mg total) by mouth at bedtime. 90 capsule 0   hydrochlorothiazide (HYDRODIURIL) 12.5 MG tablet Take 12.5 mg by mouth daily.     ipratropium-albuterol (DUONEB) 0.5-2.5 (3) MG/3ML SOLN Take 3 mLs by nebulization 2 (two) times daily. 180 mL 5   levothyroxine (SYNTHROID) 150 MCG tablet Take 150 mcg by mouth daily.     levothyroxine (SYNTHROID) 175 MCG tablet Take 1 tablet (175 mcg total) by mouth daily before breakfast. 90 tablet 1   LORazepam (ATIVAN) 2 MG tablet Take 2 mg by mouth 2 (two) times daily as needed.     losartan (COZAAR) 100 MG tablet Take 1  tablet (100 mg total) by mouth daily. 90 tablet 3   potassium chloride SA (KLOR-CON) 20 MEQ tablet Take 1 tablet (20 mEq total) by mouth daily. 90 tablet 3   predniSONE (DELTASONE) 20 MG tablet Take 1 tablet (20 mg total) by mouth 2 (two) times daily with a meal. 6 tablet 0   Tiotropium Bromide Monohydrate (SPIRIVA RESPIMAT) 2.5 MCG/ACT AERS Inhale 1 spray into the lungs daily. 12  g 0   UNKNOWN TO PATIENT Allergy injection every 2 weeks     Current Facility-Administered Medications  Medication Dose Route Frequency Provider Last Rate Last Admin   Benralizumab SOSY 30 mg  30 mg Subcutaneous Q28 days Valentina Shaggy, MD   30 mg at 06/25/20 1041     Musculoskeletal: Strength & Muscle Tone:  N/A Gait & Station:  N/A Patient leans: N/A  Psychiatric Specialty Exam: Review of Systems  Psychiatric/Behavioral:  Positive for dysphoric mood and sleep disturbance. Negative for agitation, behavioral problems, confusion, decreased concentration, hallucinations, self-injury and suicidal ideas. The patient is nervous/anxious. The patient is not hyperactive.   All other systems reviewed and are negative.   There were no vitals taken for this visit.There is no height or weight on file to calculate BMI.  General Appearance: Fairly Groomed  Eye Contact:  Good  Speech:  Clear and Coherent  Volume:  Normal  Mood:   fine  Affect:  Appropriate, Congruent, and calm  Thought Process:  Coherent  Orientation:  Full (Time, Place, and Person)  Thought Content: Logical   Suicidal Thoughts:  No  Homicidal Thoughts:  No  Memory:  Immediate;   Good  Judgement:  Good  Insight:  Good  Psychomotor Activity:  Normal  Concentration:  Concentration: Good and Attention Span: Good  Recall:  Good  Fund of Knowledge: Good  Language: Good  Akathisia:  No  Handed:  Right  AIMS (if indicated): not done  Assets:  Communication Skills Desire for Improvement  ADL's:  Intact  Cognition: WNL  Sleep:  Fair    Screenings: PHQ2-9    [place] Office Visit from 06/08/2021 in [place] Visit from 02/13/2018 in [facility] Office Visit from 08/15/2017 in [facility] Office Visit from 02/10/2017 in [facility] Office Visit from 01/13/2017 in [facility]  PHQ-2 Total Score 3 0 0 0 0  PHQ-9 Total Score 9 -- -- -- --      Flowsheet Row ED from 05/08/2019 in [place] No Risk        Assessment and Plan:  ANTOWAN SAMFORD is a 71 y.o. year old male with a history of  depression, insomnia, malignant neoplasm of thyroid gland s/p thyroidectomy in 2015. ablation , postsurgical hypothyroidism, hypocalcemia, COPD, who presents for follow up appointment for below.   1. MDD (major depressive disorder), recurrent, in partial remission ([place]) There has been steady improvement in depressive symptoms, anxiety since the last visit.  Psychosocial stressors includes medical condition of pain secondary to psoriasis, his skin condition, taking care of his wife with medical condition.  Other psychosocial stressors includes grief of loss of his ex-wife, his friends over the past year, and retirement.  He continues to have close relationship with his family.  Will continue current dose of doxepin to target depression, insomnia.  Discussed potential risk of drowsiness.  Noted that he has been on lorazepam, prescribed by his PCP; discussed potential risk of drowsiness, fall.    2. Insomnia, unspecified type It has been overall improving since being on doxepin. Although he has middle insomnia, daytime fatigue and snoring, he is not interested in pursuing sleep evaluation.  Will continue current dose of doxepin to target insomnia.   Plan Continue doxepin 50 mg at night Next appointment-  1/12 at 10 AM for 30 mis, video([phone]. will call home phone if  video does not work)  -  on lorazepam 2 mg BID by primary care doctor - he is not interested in therapy at this time   Past trials of medication: sertraline (anxiety at 100 mg),  sinequan (doxepin), fluoxetine, lexapro (perceived side effect of nightmares), mirtazapine (nightmares), bupropion, Ambien, Trazodone,    The patient demonstrates the following risk factors for suicide: Chronic risk factors for suicide include: psychiatric disorder of depression and medical illness of COPD, thyroid cancer. Acute risk factors for suicide include: unemployment and loss (financial, interpersonal, professional). Protective factors for this patient include: positive social support, coping skills and hope for the future. Considering these factors, the overall suicide risk at this point appears to be low. Patient is appropriate for outpatient follow up.         Collaboration of Care: Collaboration of Care: Other reviewed notes in Epic  Patient/Guardian was advised Release of Information must be obtained prior to any record release in order to collaborate their care with an outside provider. Patient/Guardian was advised if they have not already done so to contact the registration department to sign all necessary forms in order for [place] to release information regarding their care.   Consent: Patient/Guardian gives verbal consent for treatment and assignment of benefits for services provided during this visit. Patient/Guardian expressed understanding and agreed to proceed.    Jose Clay, MD 09/01/2022, 4:51 PM

## 2022-09-01 ENCOUNTER — Encounter: Payer: Self-pay | Admitting: Psychiatry

## 2022-09-01 ENCOUNTER — Telehealth (INDEPENDENT_AMBULATORY_CARE_PROVIDER_SITE_OTHER): Payer: Medicare HMO | Admitting: Psychiatry

## 2022-09-01 DIAGNOSIS — G4700 Insomnia, unspecified: Secondary | ICD-10-CM

## 2022-09-01 DIAGNOSIS — F3341 Major depressive disorder, recurrent, in partial remission: Secondary | ICD-10-CM | POA: Diagnosis not present

## 2022-10-21 NOTE — Progress Notes (Signed)
Virtual Visit via Video Note  I connected with Jose Burch on 10/22/22 at 10:00 AM EST by a video enabled telemedicine application and verified that I am speaking with the correct person using two identifiers.  Location: Patient: home Provider: office Persons participated in the visit- patient, provider    I discussed the limitations of evaluation and management by telemedicine and the availability of in person appointments. The patient expressed understanding and agreed to proceed.    I discussed the assessment and treatment plan with the patient. The patient was provided an opportunity to ask questions and all were answered. The patient agreed with the plan and demonstrated an understanding of the instructions.   The patient was advised to call back or seek an in-person evaluation if the symptoms worsen or if the condition fails to improve as anticipated.  I provided 15 minutes of non-face-to-face time during this encounter.   [name], MD    [facility] MD/PA/NP OP Progress Note  10/22/2022 10:26 AM Jose Burch  MRN:  [id number]  Chief Complaint:  Chief Complaint  Patient presents with   Follow-up   HPI:  This is a follow-up appointment for depression and insomnia.  He states that his wife had a fall.  He was very scared and helpless.  He called 911 as he could not get her up.  He brought blanket as she was outside.  He has been cutting trees.  He is trying to take it 1 day at the time.  He sleeps up to six hours, and feels rested.  He denies change in appetite.  He denies SI.  He thinks doxepin has been working well. He denies dizziness.  He was able to lower lorazepam to once a day.  He feels comfortable to stay on the current medication.   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 Divorced before (his ex-wife died in 2022-01-02     Visit Diagnosis:    ICD-10-CM   1. MDD (major  depressive disorder), recurrent, in partial remission ([place])  F33.41     2. Insomnia, unspecified type  G47.00       Past Psychiatric History: Please see initial evaluation for full details. I have reviewed the history. No updates at this time.     Past Medical History:  Past Medical History:  Diagnosis Date   Asthma    Cancer ([place])    Thyroid   COPD (chronic obstructive pulmonary disease) ([place])    Hypertension    Insomnia    Thyroid disease    Tobacco abuse     Past Surgical History:  Procedure Laterality Date   skin cancer removed     THYROIDECTOMY  01-02-2014    Family Psychiatric History: Please see initial evaluation for full details. I have reviewed the history. No updates at this time.     Family History:  Family History  Problem Relation Age of Onset   Cancer Mother    Asthma Mother    Heart attack Sister    Allergic rhinitis Neg Hx    Eczema Neg Hx     Social History:  Social History   Socioeconomic History   Marital status: Married    Spouse name: Not on file   Number of children: Not on file   Years of education: Not on file   Highest education level: Not on file  Occupational History  Not on file  Tobacco Use   Smoking status: Every Day    Packs/day: 0.50    Types: Cigarettes   Smokeless tobacco: Never  Vaping Use   Vaping Use: Never used  Substance and Sexual Activity   Alcohol use: No   Drug use: No   Sexual activity: Never    Partners: Female  Other Topics Concern   Not on file  Social History Narrative   Not on file   Social Determinants of Health   Financial Resource Strain: Not on file  Food Insecurity: Not on file  Transportation Needs: Not on file  Physical Activity: Not on file  Stress: Not on file  Social Connections: Not on file    Allergies:  Allergies  Allergen Reactions   Aspirin Hives    Metabolic Disorder Labs: No results found for: "HGBA1C", "MPG" No results found for: "PROLACTIN" No results found for:  "CHOL", "TRIG", "HDL", "CHOLHDL", "VLDL", "LDLCALC" Lab Results  Component Value Date   TSH 5.14 (H) 03/27/2020   TSH 0.158 (L) 05/05/2019    Therapeutic Level Labs: No results found for: "LITHIUM" No results found for: "VALPROATE" No results found for: "CBMZ"  Current Medications: Current Outpatient Medications  Medication Sig Dispense Refill   albuterol (VENTOLIN HFA) 108 (90 Base) MCG/ACT inhaler Inhale 2 puffs into the lungs every 6 (six) hours as needed for wheezing or shortness of breath. 18 g 1   amLODipine (NORVASC) 10 MG tablet Take 10 mg by mouth daily.     calcitRIOL (ROCALTROL) 0.5 MCG capsule TAKE 1 CAPSULE (0.5 MCG TOTAL) BY MOUTH DAILY. 90 capsule 0   calcium carbonate (TUMS EX) 750 MG chewable tablet Chew 1 tablet (750 mg total) by mouth 3 (three) times daily with meals. 180 tablet 3   diphenhydrAMINE (BENADRYL) 25 mg capsule Take 25 mg by mouth daily.     [START ON 10/26/2022] doxepin (SINEQUAN) 50 MG capsule Take 1 capsule (50 mg total) by mouth at bedtime. 90 capsule 0   hydrochlorothiazide (HYDRODIURIL) 12.5 MG tablet Take 12.5 mg by mouth daily.     ipratropium-albuterol (DUONEB) 0.5-2.5 (3) MG/3ML SOLN Take 3 mLs by nebulization 2 (two) times daily. 180 mL 5   levothyroxine (SYNTHROID) 150 MCG tablet Take 150 mcg by mouth daily.     levothyroxine (SYNTHROID) 175 MCG tablet Take 1 tablet (175 mcg total) by mouth daily before breakfast. 90 tablet 1   LORazepam (ATIVAN) 2 MG tablet Take 2 mg by mouth 2 (two) times daily as needed.     losartan (COZAAR) 100 MG tablet Take 1 tablet (100 mg total) by mouth daily. 90 tablet 3   potassium chloride SA (KLOR-CON) 20 MEQ tablet Take 1 tablet (20 mEq total) by mouth daily. 90 tablet 3   predniSONE (DELTASONE) 20 MG tablet Take 1 tablet (20 mg total) by mouth 2 (two) times daily with a meal. 6 tablet 0   Tiotropium Bromide Monohydrate (SPIRIVA RESPIMAT) 2.5 MCG/ACT AERS Inhale 1 spray into the lungs daily. 12 g 0   UNKNOWN TO  PATIENT Allergy injection every 2 weeks     Current Facility-Administered Medications  Medication Dose Route Frequency Provider Last Rate Last Admin   Benralizumab SOSY 30 mg  30 mg Subcutaneous Q28 days Valentina Shaggy, MD   30 mg at 06/25/20 1041     Musculoskeletal: Strength & Muscle Tone:  N/A Gait & Station:  N/A Patient leans: N/A  Psychiatric Specialty Exam: Review of Systems  Psychiatric/Behavioral:  Negative  for agitation, behavioral problems, confusion, decreased concentration, dysphoric mood, hallucinations, self-injury, sleep disturbance and suicidal ideas. The patient is nervous/anxious. The patient is not hyperactive.   All other systems reviewed and are negative.   There were no vitals taken for this visit.There is no height or weight on file to calculate BMI.  General Appearance: Fairly Groomed  Eye Contact:  Good  Speech:  Clear and Coherent  Volume:  Normal  Mood:   good  Affect:  Appropriate, Congruent, and calm  Thought Process:  Coherent  Orientation:  Full (Time, Place, and Person)  Thought Content: Logical   Suicidal Thoughts:  No  Homicidal Thoughts:  No  Memory:  Immediate;   Good  Judgement:  Good  Insight:  Good  Psychomotor Activity:  Normal  Concentration:  Concentration: Good and Attention Span: Good  Recall:  Good  Fund of Knowledge: Good  Language: Good  Akathisia:  No  Handed:  Right  AIMS (if indicated): not done  Assets:  Communication Skills Desire for Improvement  ADL's:  Intact  Cognition: WNL  Sleep:  Fair   Screenings: PHQ2-9    [place] Office Visit from 06/08/2021 in [place] Visit from 02/13/2018 in [facility] Office Visit from 08/15/2017 in [facility] Office Visit from 02/10/2017 in [facility] Office Visit from 01/13/2017 in [facility]  PHQ-2 Total Score 3 0 0 0 0  PHQ-9 Total Score  9 -- -- -- --      Flowsheet Row ED from 05/08/2019 in [place] No Risk        Assessment and Plan:  JAMEE PACHOLSKI is a 72 y.o. year old male with a history of depression, insomnia, malignant neoplasm of thyroid gland s/p thyroidectomy in 2015. ablation , postsurgical hypothyroidism, hypocalcemia, COPD, who presents for follow up appointment for below.   1. MDD (major depressive disorder), recurrent, in partial remission ([place]) There has been a steady improvement in depressive symptoms, anxiety, although he is demoralized due to medical condition, including pain secondary to psoriasis and his skin condition.  Recent psychosocial stressors includes his wife having fall, grief of loss of his ex-wife, his friends over the past year, and retirement.  He continues to have close relationship with his family.  Will continue current dose of doxepin to target depression and insomnia.  Noted that he has been able to taper down lorazepam, which is prescribed by his PCP.   2. Insomnia, unspecified type Improving since being on doxepin. Although he has middle insomnia, daytime fatigue and snoring, he is not interested in pursuing sleep evaluation.  Will continue current dose of doxepin to target insomnia.     Plan Continue doxepin 50 mg at night Next appointment-  3/22 at 11 AM for 30 mis, [phone]. will call home phone if video does not work)  - on lorazepam 2 mg daily as need for anxiety by primary care doctor - he is not interested in therapy at this time   Past trials of medication: sertraline (anxiety at 100 mg),  sinequan (doxepin), fluoxetine, lexapro (perceived side effect of nightmares), mirtazapine (nightmares), bupropion, Ambien, Trazodone,    The patient demonstrates the following risk factors for suicide: Chronic risk factors for suicide include: psychiatric disorder of depression and medical illness of COPD, thyroid cancer. Acute risk  factors for suicide include: unemployment and loss (financial, interpersonal, professional). Protective factors for this patient include: positive social support,  coping skills and hope for the future. Considering these factors, the overall suicide risk at this point appears to be low. Patient is appropriate for outpatient follow up.          Collaboration of Care: Collaboration of Care: Other reviewed notes in Epic  Patient/Guardian was advised Release of Information must be obtained prior to any record release in order to collaborate their care with an outside provider. Patient/Guardian was advised if they have not already done so to contact the registration department to sign all necessary forms in order for [place] to release information regarding their care.   Consent: Patient/Guardian gives verbal consent for treatment and assignment of benefits for services provided during this visit. Patient/Guardian expressed understanding and agreed to proceed.    [name], MD 10/22/2022, 10:26 AM

## 2022-10-22 ENCOUNTER — Telehealth (INDEPENDENT_AMBULATORY_CARE_PROVIDER_SITE_OTHER): Payer: Medicare HMO | Admitting: Psychiatry

## 2022-10-22 ENCOUNTER — Encounter: Payer: Self-pay | Admitting: Psychiatry

## 2022-10-22 DIAGNOSIS — F3341 Major depressive disorder, recurrent, in partial remission: Secondary | ICD-10-CM | POA: Diagnosis not present

## 2022-10-22 DIAGNOSIS — G4700 Insomnia, unspecified: Secondary | ICD-10-CM

## 2022-10-22 MED ORDER — DOXEPIN HCL 50 MG PO CAPS: 50.0000 mg | ORAL_CAPSULE | Freq: Every day | ORAL | 0 refills | Status: DC

## 2022-10-22 NOTE — Patient Instructions (Signed)
Continue doxepin 50 mg at night Next appointment-  3/22 at 11 AM

## 2022-12-28 NOTE — Progress Notes (Signed)
Virtual Visit via Video Note  I connected with Jose Burch on 12/31/22 at 11:00 AM EDT by a video enabled telemedicine application and verified that I am speaking with the correct person using two identifiers.  Location: Patient: home Provider: office Persons participated in the visit- patient, provider    I discussed the limitations of evaluation and management by telemedicine and the availability of in person appointments. The patient expressed understanding and agreed to proceed.     I discussed the assessment and treatment plan with the patient. The patient was provided an opportunity to ask questions and all were answered. The patient agreed with the plan and demonstrated an understanding of the instructions.   The patient was advised to call back or seek an in-person evaluation if the symptoms worsen or if the condition fails to improve as anticipated.  I provided 15 minutes of non-face-to-face time during this encounter.   [name], MD     [facility] MD/PA/NP OP Progress Note  12/31/2022 12:17 PM Jose Burch  MRN:  [id number]  Chief Complaint:  Chief Complaint  Patient presents with   Follow-up   HPI:  This is a follow-up appointment for depression and insomnia.  He states that he is getting treatment for skin cancer.  It is treatable.  He also states that it is hereditary.  He feels tired of it.  He has been helping his wife for his appointments.  He enjoys watching son coming up.  He also helps his neighbors.  He continues to take lorazepam due to anxiety.  Although he feels tired, he will make himself do things.  He sleeps up to 4 hours.  He feels down from time to time.  He denies SI.  He denies alcohol use or drug use.  He would like whenever medication change which will be helpful for him.    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 Divorced before (his ex-wife died in 2022/01/28  Visit Diagnosis:    ICD-10-CM   1. MDD (major depressive disorder), recurrent, in partial remission ([place])  F33.41     2. Anxiety disorder, unspecified type  F41.9     3. Insomnia, unspecified type  G47.00       Past Psychiatric History: Please see initial evaluation for full details. I have reviewed the history. No updates at this time.     Past Medical History:  Past Medical History:  Diagnosis Date   Asthma    Cancer ([place])    Thyroid   COPD (chronic obstructive pulmonary disease) ([place])    Hypertension    Insomnia    Thyroid disease    Tobacco abuse     Past Surgical History:  Procedure Laterality Date   skin cancer removed     THYROIDECTOMY  01-28-14    Family Psychiatric History: Please see initial evaluation for full details. I have reviewed the history. No updates at this time.     Family History:  Family History  Problem Relation Age of Onset   Cancer Mother    Asthma Mother    Heart attack Sister    Allergic rhinitis Neg Hx    Eczema Neg Hx     Social  History:  Social History   Socioeconomic History   Marital status: Married    Spouse name: Not on file   Number of children: Not on file   Years of education: Not on file   Highest education level: Not on file  Occupational History   Not on file  Tobacco Use   Smoking status: Every Day    Packs/day: .5    Types: Cigarettes   Smokeless tobacco: Never  Vaping Use   Vaping Use: Never used  Substance and Sexual Activity   Alcohol use: No   Drug use: No   Sexual activity: Never    Partners: Female  Other Topics Concern   Not on file  Social History Narrative   Not on file   Social Determinants of Health   Financial Resource Strain: Not on file  Food Insecurity: Not on file  Transportation Needs: Not on file  Physical Activity: Not on file   Stress: Not on file  Social Connections: Not on file    Allergies:  Allergies  Allergen Reactions   Aspirin Hives    Metabolic Disorder Labs: No results found for: "HGBA1C", "MPG" No results found for: "PROLACTIN" No results found for: "CHOL", "TRIG", "HDL", "CHOLHDL", "VLDL", "LDLCALC" Lab Results  Component Value Date   TSH 5.14 (H) 03/27/2020   TSH 0.158 (L) 05/05/2019    Therapeutic Level Labs: No results found for: "LITHIUM" No results found for: "VALPROATE" No results found for: "CBMZ"  Current Medications: Current Outpatient Medications  Medication Sig Dispense Refill   doxepin (SINEQUAN) 10 MG capsule Take 1 capsule (10 mg total) by mouth at bedtime. Total of 60 mg at night. Take along with 50 mg cap 30 capsule 0   albuterol (VENTOLIN HFA) 108 (90 Base) MCG/ACT inhaler Inhale 2 puffs into the lungs every 6 (six) hours as needed for wheezing or shortness of breath. 18 g 1   amLODipine (NORVASC) 10 MG tablet Take 10 mg by mouth daily.     calcitRIOL (ROCALTROL) 0.5 MCG capsule TAKE 1 CAPSULE (0.5 MCG TOTAL) BY MOUTH DAILY. 90 capsule 0   calcium carbonate (TUMS EX) 750 MG chewable tablet Chew 1 tablet (750 mg total) by mouth 3 (three) times daily with meals. 180 tablet 3   diphenhydrAMINE (BENADRYL) 25 mg capsule Take 25 mg by mouth daily.     doxepin (SINEQUAN) 50 MG capsule Take 1 capsule (50 mg total) by mouth at bedtime. 90 capsule 0   hydrochlorothiazide (HYDRODIURIL) 12.5 MG tablet Take 12.5 mg by mouth daily.     ipratropium-albuterol (DUONEB) 0.5-2.5 (3) MG/3ML SOLN Take 3 mLs by nebulization 2 (two) times daily. 180 mL 5   levothyroxine (SYNTHROID) 150 MCG tablet Take 150 mcg by mouth daily.     levothyroxine (SYNTHROID) 175 MCG tablet Take 1 tablet (175 mcg total) by mouth daily before breakfast. 90 tablet 1   LORazepam (ATIVAN) 2 MG tablet Take 2 mg by mouth 2 (two) times daily as needed.     losartan (COZAAR) 100 MG tablet Take 1 tablet (100 mg total) by  mouth daily. 90 tablet 3   potassium chloride SA (KLOR-CON) 20 MEQ tablet Take 1 tablet (20 mEq total) by mouth daily. 90 tablet 3   predniSONE (DELTASONE) 20 MG tablet Take 1 tablet (20 mg total) by mouth 2 (two) times daily with a meal. 6 tablet 0   Tiotropium Bromide Monohydrate (SPIRIVA RESPIMAT) 2.5 MCG/ACT AERS Inhale 1 spray into the lungs daily. 12 g 0  UNKNOWN TO PATIENT Allergy injection every 2 weeks     Current Facility-Administered Medications  Medication Dose Route Frequency Provider Last Rate Last Admin   Benralizumab SOSY 30 mg  30 mg Subcutaneous Q28 days Valentina Shaggy, MD   30 mg at 06/25/20 1041     Musculoskeletal: Strength & Muscle Tone:  N/A Gait & Station:  N/A Patient leans: N/A  Psychiatric Specialty Exam: Review of Systems  Psychiatric/Behavioral:  Positive for dysphoric mood and sleep disturbance. Negative for agitation, behavioral problems, confusion, decreased concentration, hallucinations, self-injury and suicidal ideas. The patient is nervous/anxious. The patient is not hyperactive.   All other systems reviewed and are negative.   There were no vitals taken for this visit.There is no height or weight on file to calculate BMI.  General Appearance: Fairly Groomed  Eye Contact:  Good  Speech:  Clear and Coherent  Volume:  Normal  Mood:   stressful  Affect:  Appropriate, Congruent, and calm  Thought Process:  Coherent  Orientation:  Full (Time, Place, and Person)  Thought Content: Logical   Suicidal Thoughts:  No  Homicidal Thoughts:  No  Memory:  Immediate;   Good  Judgement:  Good  Insight:  Good  Psychomotor Activity:  Normal  Concentration:  Concentration: Good and Attention Span: Good  Recall:  Good  Fund of Knowledge: Good  Language: Good  Akathisia:  No  Handed:  Right  AIMS (if indicated): not done  Assets:  Communication Skills Desire for Improvement  ADL's:  Intact  Cognition: WNL  Sleep:  Fair   Screenings: Brewing technologist Office Visit from 06/08/2021 in [place] Office Visit from 02/13/2018 in [facility] Office Visit from 08/15/2017 in [facility] Office Visit from 02/10/2017 in [facility] Office Visit from 01/13/2017 in [facility]  PHQ-2 Total Score 3 0 0 0 0  PHQ-9 Total Score 9 -- -- -- --      [place] ED from 05/08/2019 in [facility] Emergency Department at [place] No Risk        Assessment and Plan:  Jose Burch is a 72 y.o. year old male with a history of depression, insomnia, malignant neoplasm of thyroid gland s/p thyroidectomy in 2015. ablation , postsurgical hypothyroidism, hypocalcemia, COPD, who presents for follow up appointment for below.   1. MDD (major depressive disorder), recurrent, mild ([place]) # Anxiety state Acute stressors include: skin treatment  Other stressors include: psoriasis, taking care of his wife with medical condition, loss of his ex wife, retirement    History:   He reports slight worsening in depressive symptoms and anxiety in the context of stressors as above.  He is willing to try slight uptitration of doxepin to optimize treatment for depression and anxiety given his adverse reaction of xerostomia at much higher dose. Will monitor drowsiness, fall. The hope is to taper off lorazepam, which has been prescribed by his PCP as well.   2. Insomnia, unspecified type - Although he has middle insomnia, daytime fatigue and snoring, he is not interested in pursuing sleep evaluation. Fair.  He has good benefit from doxepin.  Will Titrate the dose as described above.    Plan Increase doxepin 60 mg at night - xerostomia from 75 mg at night Next appointment-  4/19 at 9 30 for 30 mis, video([phone]. will call home  phone if video does not  work)  - on lorazepam 2 mg once to twice a day as need for anxiety by primary care doctor - he is not interested in therapy at this time   Past trials of medication: sertraline (anxiety at 100 mg),  sinequan (doxepin), fluoxetine, lexapro (perceived side effect of nightmares), mirtazapine (nightmares), bupropion, Ambien, Trazodone,    The patient demonstrates the following risk factors for suicide: Chronic risk factors for suicide include: psychiatric disorder of depression and medical illness of COPD, thyroid cancer. Acute risk factors for suicide include: unemployment and loss (financial, interpersonal, professional). Protective factors for this patient include: positive social support, coping skills and hope for the future. Considering these factors, the overall suicide risk at this point appears to be low. Patient is appropriate for outpatient follow up.        Collaboration of Care: Collaboration of Care: Other reviewed notes in Epic  Patient/Guardian was advised Release of Information must be obtained prior to any record release in order to collaborate their care with an outside provider. Patient/Guardian was advised if they have not already done so to contact the registration department to sign all necessary forms in order for [place] to release information regarding their care.   Consent: Patient/Guardian gives verbal consent for treatment and assignment of benefits for services provided during this visit. Patient/Guardian expressed understanding and agreed to proceed.    [name], MD 12/31/2022, 12:17 PM

## 2022-12-31 ENCOUNTER — Telehealth (INDEPENDENT_AMBULATORY_CARE_PROVIDER_SITE_OTHER): Payer: Medicare HMO | Admitting: Psychiatry

## 2022-12-31 ENCOUNTER — Encounter: Payer: Self-pay | Admitting: Psychiatry

## 2022-12-31 DIAGNOSIS — F419 Anxiety disorder, unspecified: Secondary | ICD-10-CM | POA: Diagnosis not present

## 2022-12-31 DIAGNOSIS — G4700 Insomnia, unspecified: Secondary | ICD-10-CM | POA: Diagnosis not present

## 2022-12-31 DIAGNOSIS — F3341 Major depressive disorder, recurrent, in partial remission: Secondary | ICD-10-CM

## 2022-12-31 MED ORDER — DOXEPIN HCL 10 MG PO CAPS
10.0000 mg | ORAL_CAPSULE | Freq: Every day | ORAL | 0 refills | Status: DC
Start: 2022-12-31 — End: 2023-01-28

## 2023-01-25 NOTE — Progress Notes (Signed)
Virtual Visit via Video Note  I connected with Jose Burch on 01/28/23 at  9:30 AM EDT by a video enabled telemedicine application and verified that I am speaking with the correct person using two identifiers.  Location: Patient: home Provider: office Persons participated in the visit- patient, provider    I discussed the limitations of evaluation and management by telemedicine and the availability of in person appointments. The patient expressed understanding and agreed to proceed.   I discussed the assessment and treatment plan with the patient. The patient was provided an opportunity to ask questions and all were answered. The patient agreed with the plan and demonstrated an understanding of the instructions.   The patient was advised to call back or seek an in-person evaluation if the symptoms worsen or if the condition fails to improve as anticipated.  I provided 20 minutes of non-face-to-face time during this encounter.   Neysa Hotter, MD     [facility] MD/PA/NP OP Progress Note  01/28/2023 10:02 AM Jose Burch  MRN:  [id number]  Chief Complaint:  Chief Complaint  Patient presents with   Follow-up   HPI:  This is a follow-up appointment for depression, insomnia.  He states that he struggles with allergy symptoms.  He burned the bush.  At the time of the visit, he is smoking a cigarette.  When he is informed about smoking cessation, he states that it is already his head.  He will quit when he has made up his mind.  He thinks he has been sleeping better since higher dose of doxepin.  Although he feels a little drowsy in the morning, he is fine after drinking a coffee.  He drinks 2 cups of coffee in the morning.  He denies fall.  He feels depressed when he thinks about skin cancer.  He states that he fights in history.  He was in [place] combat.  He has "very bad dream" of body parts in trees.  It occurs at least once a week. his brother-in-law was killed in a combat.  He tries not  to think about these.  He denies SI.   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 Divorced before (his ex-wife died in 2022-02-13  Visit Diagnosis:    ICD-10-CM   1. MDD (major depressive disorder), recurrent, in partial remission  F33.41     2. Anxiety disorder, unspecified type  F41.9     3. Nicotine dependence, uncomplicated, unspecified nicotine product type  F17.200     4. PTSD (post-traumatic stress disorder)  F43.10     5. Insomnia, unspecified type  G47.00       Past Psychiatric History: Please see initial evaluation for full details. I have reviewed the history. No updates at this time.     Past Medical History:  Past Medical History:  Diagnosis Date   Asthma    Cancer (HCC)    Thyroid   COPD (chronic obstructive  pulmonary disease) (HCC)    Hypertension    Insomnia    Thyroid disease    Tobacco abuse     Past Surgical History:  Procedure Laterality Date   skin cancer removed     THYROIDECTOMY  2015    Family Psychiatric History: Please see initial evaluation for full details. I have reviewed the history. No updates at this time.     Family History:  Family History  Problem Relation Age of Onset   Cancer Mother    Asthma Mother    Heart attack Sister    Allergic rhinitis Neg Hx    Eczema Neg Hx     Social History:  Social History   Socioeconomic History   Marital status: Married    Spouse name: Not on file   Number of children: Not on file   Years of education: Not on file    Highest education level: Not on file  Occupational History   Not on file  Tobacco Use   Smoking status: Every Day    Packs/day: .5    Types: Cigarettes   Smokeless tobacco: Never  Vaping Use   Vaping Use: Never used  Substance and Sexual Activity   Alcohol use: No   Drug use: No   Sexual activity: Never    Partners: Female  Other Topics Concern   Not on file  Social History Narrative   Not on file   Social Determinants of Health   Financial Resource Strain: Not on file  Food Insecurity: Not on file  Transportation Needs: Not on file  Physical Activity: Not on file  Stress: Not on file  Social Connections: Not on file    Allergies:  Allergies  Allergen Reactions   Aspirin Hives    Metabolic Disorder Labs: No results found for: "HGBA1C", "MPG" No results found for: "PROLACTIN" No results found for: "CHOL", "TRIG", "HDL", "CHOLHDL", "VLDL", "LDLCALC" Lab Results  Component Value Date   TSH 5.14 (H) 03/27/2020   TSH 0.158 (L) 05/05/2019    Therapeutic Level Labs: No results found for: "LITHIUM" No results found for: "VALPROATE" No results found for: "CBMZ"  Current Medications: Current Outpatient Medications  Medication Sig Dispense Refill   prazosin (MINIPRESS) 1 MG capsule Take 1 capsule (1 mg total) by mouth at bedtime for 3 days. 3 capsule 0   [START ON 01/31/2023] prazosin (MINIPRESS) 2 MG capsule Take 1 capsule (2 mg total) by mouth at bedtime. Start after completing 1 mg at night for 3 days 30 capsule 0   albuterol (VENTOLIN HFA) 108 (90 Base) MCG/ACT inhaler Inhale 2 puffs into the lungs every 6 (six) hours as needed for wheezing or shortness of breath. 18 g 1   amLODipine (NORVASC) 10 MG tablet Take 10 mg by mouth daily.     calcitRIOL (ROCALTROL) 0.5 MCG capsule TAKE 1 CAPSULE (0.5 MCG TOTAL) BY MOUTH DAILY. 90 capsule 0   calcium carbonate (TUMS EX) 750 MG chewable tablet Chew 1 tablet (750 mg total) by mouth 3 (three) times daily with meals. 180  tablet 3   diphenhydrAMINE (BENADRYL) 25 mg capsule Take 25 mg by mouth daily.     doxepin (SINEQUAN) 10 MG capsule Take 1 capsule (10 mg total) by mouth at bedtime. Total of 60 mg at night. Take along with 50 mg cap 90 capsule 0   doxepin (SINEQUAN) 50 MG capsule Take 1 capsule (50 mg total) by mouth at bedtime. 90 capsule 0   hydrochlorothiazide (HYDRODIURIL) 12.5 MG  tablet Take 12.5 mg by mouth daily.     ipratropium-albuterol (DUONEB) 0.5-2.5 (3) MG/3ML SOLN Take 3 mLs by nebulization 2 (two) times daily. 180 mL 5   levothyroxine (SYNTHROID) 150 MCG tablet Take 150 mcg by mouth daily.     levothyroxine (SYNTHROID) 175 MCG tablet Take 1 tablet (175 mcg total) by mouth daily before breakfast. 90 tablet 1   LORazepam (ATIVAN) 2 MG tablet Take 2 mg by mouth 2 (two) times daily as needed.     losartan (COZAAR) 100 MG tablet Take 1 tablet (100 mg total) by mouth daily. 90 tablet 3   potassium chloride SA (KLOR-CON) 20 MEQ tablet Take 1 tablet (20 mEq total) by mouth daily. 90 tablet 3   predniSONE (DELTASONE) 20 MG tablet Take 1 tablet (20 mg total) by mouth 2 (two) times daily with a meal. 6 tablet 0   Tiotropium Bromide Monohydrate (SPIRIVA RESPIMAT) 2.5 MCG/ACT AERS Inhale 1 spray into the lungs daily. 12 g 0   UNKNOWN TO PATIENT Allergy injection every 2 weeks     Current Facility-Administered Medications  Medication Dose Route Frequency Provider Last Rate Last Admin   Benralizumab SOSY 30 mg  30 mg Subcutaneous Q28 days Alfonse Spruce, MD   30 mg at 06/25/20 1041     Musculoskeletal: Strength & Muscle Tone:  N/A Gait & Station:  N/A Patient leans: N/A  Psychiatric Specialty Exam: Review of Systems  Psychiatric/Behavioral:  Positive for dysphoric mood. Negative for agitation, behavioral problems, confusion, decreased concentration, hallucinations, self-injury, sleep disturbance and suicidal ideas. The patient is not nervous/anxious and is not hyperactive.   All other systems  reviewed and are negative.   There were no vitals taken for this visit.There is no height or weight on file to calculate BMI.  General Appearance: Fairly Groomed  Eye Contact:  Good  Speech:  Clear and Coherent  Volume:  Normal  Mood:   good  Affect:  Appropriate, Congruent, and calm  Thought Process:  Coherent  Orientation:  Full (Time, Place, and Person)  Thought Content: Logical   Suicidal Thoughts:  No  Homicidal Thoughts:  No  Memory:  Immediate;   Good  Judgement:  Good  Insight:  Good  Psychomotor Activity:  Normal  Concentration:  Concentration: Good and Attention Span: Good  Recall:  Good  Fund of Knowledge: Good  Language: Good  Akathisia:  No  Handed:  Right  AIMS (if indicated): not done  Assets:  Communication Skills Desire for Improvement  ADL's:  Intact  Cognition: WNL  Sleep:  Fair   Screenings: Equities trader Office Visit from 06/08/2021 in [facility] Office Visit from 02/13/2018 in [facility] Office Visit from 08/15/2017 in [facility] Office Visit from 02/10/2017 in [facility] Office Visit from 01/13/2017 in [facility]  PHQ-2 Total Score 3 0 0 0 0  PHQ-9 Total Score 9 -- -- -- --      Flowsheet Row ED from 05/08/2019 in [facility] at [facility]  C-SSRS RISK CATEGORY No Risk        Assessment and Plan:  Jose Burch is a 72 y.o. year old male with a history of depression, insomnia, malignant neoplasm of thyroid gland s/p thyroidectomy in 2015. ablation , postsurgical hypothyroidism, hypocalcemia, COPD, who presents for follow up appointment for below.   1. MDD (major depressive  disorder), recurrent, in partial remission 2. Anxiety disorder, unspecified type # PTSD Acute stressors include: skin treatment, allergy  Other  stressors include: psoriasis, taking care of his wife with medical condition, loss of his ex wife, retirement, combat in [place]    History:  3. Insomnia, unspecified type - Although he has middle insomnia, daytime fatigue and snoring, he is not interested in pursuing sleep evaluation. He reports overall improvement in insomnia since uptitration of doxepin.  He shared that this time that he was in [place] combat, and is experiencing nightmares at least once a week.  Will start prazosin was slow uptitration to target this.  Discussed potential risk of orthostatic hypotension.  Will continue doxepin to target depression, PTSD and anxiety.  He was advised again to reach out to his PCP to taper down the lorazepam to avoid risk of falls.    4. Nicotine dependence, uncomplicated, unspecified nicotine product type He is not interested in pharmacological treatment at this time.    Plan Continue doxepin 60 mg at night - xerostomia from 75 mg at night Start prazosin 1 mg at night for 3 days, then 2 mg at night  Next appointment-  5/10 at 10 AM for 30 mis, [phone]. will call home phone if video does not work)  - on lorazepam 2 mg once to twice a day as need for anxiety by primary care doctor - he is not interested in therapy at this time   Past trials of medication: sertraline (anxiety at 100 mg),  sinequan (doxepin), fluoxetine, lexapro (perceived side effect of nightmares), mirtazapine (nightmares), bupropion, Ambien, Trazodone,    The patient demonstrates the following risk factors for suicide: Chronic risk factors for suicide include: psychiatric disorder of depression and medical illness of COPD, thyroid cancer. Acute risk factors for suicide include: unemployment and loss (financial, interpersonal, professional). Protective factors for this patient include: positive social support, coping skills and hope for the future. Considering these factors, the overall suicide risk at this point  appears to be low. Patient is appropriate for outpatient follow up.      Collaboration of Care: Collaboration of Care: Other reviewed notes in Epic  Patient/Guardian was advised Release of Information must be obtained prior to any record release in order to collaborate their care with an outside provider. Patient/Guardian was advised if they have not already done so to contact the registration department to sign all necessary forms in order for [place] to release information regarding their care.   Consent: Patient/Guardian gives verbal consent for treatment and assignment of benefits for services provided during this visit. Patient/Guardian expressed understanding and agreed to proceed.    Neysa Hotter, MD 01/28/2023, 10:02 AM

## 2023-01-28 ENCOUNTER — Telehealth (INDEPENDENT_AMBULATORY_CARE_PROVIDER_SITE_OTHER): Payer: Medicare HMO | Admitting: Psychiatry

## 2023-01-28 ENCOUNTER — Encounter: Payer: Self-pay | Admitting: Psychiatry

## 2023-01-28 DIAGNOSIS — F17210 Nicotine dependence, cigarettes, uncomplicated: Secondary | ICD-10-CM | POA: Diagnosis not present

## 2023-01-28 DIAGNOSIS — F4310 Post-traumatic stress disorder, unspecified: Secondary | ICD-10-CM | POA: Diagnosis not present

## 2023-01-28 DIAGNOSIS — G4700 Insomnia, unspecified: Secondary | ICD-10-CM

## 2023-01-28 DIAGNOSIS — F17200 Nicotine dependence, unspecified, uncomplicated: Secondary | ICD-10-CM

## 2023-01-28 DIAGNOSIS — F419 Anxiety disorder, unspecified: Secondary | ICD-10-CM | POA: Diagnosis not present

## 2023-01-28 DIAGNOSIS — F3341 Major depressive disorder, recurrent, in partial remission: Secondary | ICD-10-CM

## 2023-01-28 MED ORDER — DOXEPIN HCL 50 MG PO CAPS: 50.0000 mg | ORAL_CAPSULE | Freq: Every day | ORAL | 0 refills | Status: DC

## 2023-01-28 MED ORDER — PRAZOSIN HCL 1 MG PO CAPS: 1.0000 mg | ORAL_CAPSULE | Freq: Every day | ORAL | 0 refills | Status: DC

## 2023-01-28 MED ORDER — DOXEPIN HCL 10 MG PO CAPS: 10.0000 mg | ORAL_CAPSULE | Freq: Every day | ORAL | 0 refills | Status: DC

## 2023-01-28 MED ORDER — PRAZOSIN HCL 2 MG PO CAPS: 2.0000 mg | ORAL_CAPSULE | Freq: Every day | ORAL | 0 refills | Status: DC

## 2023-01-28 NOTE — Patient Instructions (Signed)
Continue doxepin 60 mg at night - xerostomia from 75 mg at night Start prazosin 1 mg at night for 3 days, then 2 mg at night  Next appointment-  5/10 at 10 AM

## 2023-02-17 NOTE — Progress Notes (Addendum)
Virtual Visit via Video Note  I connected with Jose Burch on 02/18/23 at 10:00 AM EDT by a video enabled telemedicine application and verified that I am speaking with the correct person using two identifiers.  Location: Patient: home Provider: office Persons participated in the visit- patient, provider    I discussed the limitations of evaluation and management by telemedicine and the availability of in person appointments. The patient expressed understanding and agreed to proceed.    I discussed the assessment and treatment plan with the patient. The patient was provided an opportunity to ask questions and all were answered. The patient agreed with the plan and demonstrated an understanding of the instructions.   The patient was advised to call back or seek an in-person evaluation if the symptoms worsen or if the condition fails to improve as anticipated.  I provided 15 minutes of non-face-to-face time during this encounter.   Neysa Hotter, MD     [facility] MD/PA/NP OP Progress Note  02/18/2023 11:52 AM Jose Burch  MRN:  [id number]  Chief Complaint:  Chief Complaint  Patient presents with   Follow-up   HPI:  This is a follow-up appointment for PTSD, depression and insomnia.  She states that his wife's ex-husband died from liver cancer/alcohol use.  He will go to the funeral.  He is struggling with itchiness due to psoriasis.  He is worried about the skin all the time.  Although he enjoys mowing glass, he has allergy.  His mood is good and bad.  He sleeps up to 5 hours.  He has nightmares, which she does not want to have.  He has not tried prazosin as he is concerned of taking another medication.  He would like to hold it for now.  He has been taking doxepin 10 mg instead of 60 mg as he was feeling drunk the next morning.  He agrees to uptitrate the dose at this time.  He denies SI.   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 Divorced before (his ex-wife died in 03/10/22  Visit Diagnosis:    ICD-10-CM   1. MDD (major depressive disorder), recurrent, in partial remission (HCC)  F33.41     2. Anxiety disorder, unspecified type  F41.9     3. Insomnia, unspecified type  G47.00       Past Psychiatric History: Please see initial evaluation for full details. I have reviewed the history. No updates at this time.     Past Medical History:  Past Medical History:  Diagnosis Date   Asthma    Cancer (HCC)    Thyroid   COPD (chronic obstructive pulmonary disease) (HCC)    Hypertension    Insomnia    Thyroid disease    Tobacco abuse     Past Surgical History:  Procedure Laterality Date   skin cancer removed     THYROIDECTOMY  03/10/14    Family Psychiatric History: Please see initial evaluation for full details. I have reviewed the history. No updates at this time.     Family History:  Family History  Problem Relation  Age of Onset   Cancer Mother    Asthma Mother    Heart attack Sister    Allergic rhinitis Neg Hx    Eczema Neg Hx     Social History:  Social History   Socioeconomic History   Marital status: Married    Spouse name: Not on file   Number of children: Not on file   Years of education: Not on file   Highest education level: Not on file  Occupational History   Not on file  Tobacco Use   Smoking status: Every Day    Packs/day: .5    Types: Cigarettes   Smokeless tobacco: Never  Vaping Use   Vaping Use: Never used  Substance and Sexual Activity   Alcohol use: No   Drug use: No   Sexual activity: Never    Partners: Female  Other Topics Concern   Not on file  Social History Narrative    Not on file   Social Determinants of Health   Financial Resource Strain: Not on file  Food Insecurity: Not on file  Transportation Needs: Not on file  Physical Activity: Not on file  Stress: Not on file  Social Connections: Not on file    Allergies:  Allergies  Allergen Reactions   Aspirin Hives    Metabolic Disorder Labs: No results found for: "HGBA1C", "MPG" No results found for: "PROLACTIN" No results found for: "CHOL", "TRIG", "HDL", "CHOLHDL", "VLDL", "LDLCALC" Lab Results  Component Value Date   TSH 5.14 (H) 03/27/2020   TSH 0.158 (L) 05/05/2019    Therapeutic Level Labs: No results found for: "LITHIUM" No results found for: "VALPROATE" No results found for: "CBMZ"  Current Medications: Current Outpatient Medications  Medication Sig Dispense Refill   doxepin (SINEQUAN) 10 MG capsule Take 2 capsules (20 mg total) by mouth at bedtime. 60 capsule 1   albuterol (VENTOLIN HFA) 108 (90 Base) MCG/ACT inhaler Inhale 2 puffs into the lungs every 6 (six) hours as needed for wheezing or shortness of breath. 18 g 1   amLODipine (NORVASC) 10 MG tablet Take 10 mg by mouth daily.     calcitRIOL (ROCALTROL) 0.5 MCG capsule TAKE 1 CAPSULE (0.5 MCG TOTAL) BY MOUTH DAILY. 90 capsule 0   calcium carbonate (TUMS EX) 750 MG chewable tablet Chew 1 tablet (750 mg total) by mouth 3 (three) times daily with meals. 180 tablet 3   diphenhydrAMINE (BENADRYL) 25 mg capsule Take 25 mg by mouth daily.     doxepin (SINEQUAN) 10 MG capsule Take 1 capsule (10 mg total) by mouth at bedtime. Total of 60 mg at night. Take along with 50 mg cap 90 capsule 0   hydrochlorothiazide (HYDRODIURIL) 12.5 MG tablet Take 12.5 mg by mouth daily.     ipratropium-albuterol (DUONEB) 0.5-2.5 (3) MG/3ML SOLN Take 3 mLs by nebulization 2 (two) times daily. 180 mL 5   levothyroxine (SYNTHROID) 150 MCG tablet Take 150 mcg by mouth daily.     levothyroxine (SYNTHROID) 175 MCG tablet Take 1 tablet (175 mcg total) by mouth  daily before breakfast. 90 tablet 1   LORazepam (ATIVAN) 2 MG tablet Take 2 mg by mouth 2 (two) times daily as needed.     losartan (COZAAR) 100 MG tablet Take 1 tablet (100 mg total) by mouth daily. 90 tablet 3   potassium chloride SA (KLOR-CON) 20 MEQ tablet Take 1 tablet (20 mEq total) by mouth daily. 90 tablet 3   prazosin (MINIPRESS) 1 MG capsule Take 1 capsule (  1 mg total) by mouth at bedtime for 3 days. 3 capsule 0   prazosin (MINIPRESS) 2 MG capsule Take 1 capsule (2 mg total) by mouth at bedtime. Start after completing 1 mg at night for 3 days 30 capsule 0   predniSONE (DELTASONE) 20 MG tablet Take 1 tablet (20 mg total) by mouth 2 (two) times daily with a meal. 6 tablet 0   Tiotropium Bromide Monohydrate (SPIRIVA RESPIMAT) 2.5 MCG/ACT AERS Inhale 1 spray into the lungs daily. 12 g 0   UNKNOWN TO PATIENT Allergy injection every 2 weeks     Current Facility-Administered Medications  Medication Dose Route Frequency Provider Last Rate Last Admin   Benralizumab SOSY 30 mg  30 mg Subcutaneous Q28 days Alfonse Spruce, MD   30 mg at 06/25/20 1041     Musculoskeletal: Strength & Muscle Tone:  N/A Gait & Station:  N/A Patient leans: N/A  Psychiatric Specialty Exam: Review of Systems  Psychiatric/Behavioral:  Positive for sleep disturbance. Negative for agitation, behavioral problems, confusion, decreased concentration, dysphoric mood, hallucinations, self-injury and suicidal ideas. The patient is nervous/anxious. The patient is not hyperactive.   All other systems reviewed and are negative.   There were no vitals taken for this visit.There is no height or weight on file to calculate BMI.  General Appearance: Fairly Groomed  Eye Contact:  Good  Speech:  Clear and Coherent  Volume:  Normal  Mood:   good  Affect:  Appropriate, Congruent, and calm  Thought Process:  Coherent  Orientation:  Full (Time, Place, and Person)  Thought Content: Logical   Suicidal Thoughts:  No   Homicidal Thoughts:  No  Memory:  Immediate;   Good  Judgement:  Good  Insight:  Good  Psychomotor Activity:  Normal  Concentration:  Concentration: Good and Attention Span: Good  Recall:  Good  Fund of Knowledge: Good  Language: Good  Akathisia:  No  Handed:  Right  AIMS (if indicated): not done  Assets:  Communication Skills Desire for Improvement  ADL's:  Intact  Cognition: WNL  Sleep:  Fair   Screenings: Equities trader Office Visit from 06/08/2021 in [facility] Office Visit from 02/13/2018 in [facility] Office Visit from 08/15/2017 in [facility] Office Visit from 02/10/2017 in [facility] Office Visit from 01/13/2017 in [facility]  PHQ-2 Total Score 3 0 0 0 0  PHQ-9 Total Score 9 -- -- -- --      Flowsheet Row ED from 05/08/2019 in [facility] Emergency Department at [facility]  C-SSRS RISK CATEGORY No Risk        Assessment and Plan:  Jose Burch is a 72 y.o. year old male with a history of depression, insomnia, malignant neoplasm of thyroid gland s/p thyroidectomy in 2015. ablation , postsurgical hypothyroidism, hypocalcemia, COPD, who presents for follow up appointment for below.   1. MDD (major depressive disorder), recurrent, in partial remission (HCC) 2. Anxiety disorder, unspecified type Acute stressors include: skin treatment, allergy  Other stressors include: psoriasis, taking care of his wife with medical condition, loss of his ex wife, retirement, combat in [place]    History:  Addendum on 6/9 -This addendum is drafted in reference to the interview on 5/10 to ensure comprehensive documentation. He reports ongoing depressive symptoms and anxiety in the setting of more the dose of doxepin due to concern of  drowsiness from higher dose.  He is willing to try the  middle dose to see if it mitigates its potential side effect.  Although he was recommended to try prazosin given his nightmares, he prefers not to start this medication at this time.   3. Insomnia, unspecified type - Although he has middle insomnia, daytime fatigue and snoring, he is not interested in pursuing sleep evaluation as he cannot wear a mask.  Unchanged.  Will titrate trazodone as described above.    4. Nicotine dependence, uncomplicated, unspecified nicotine product type He is not interested in pharmacological treatment at this time.    Plan Increase doxepin 20 mg at night - drowsiness from 60 mg, xerostomia from 75 mg at night Hold prazosin (he does not want to try this) Next appointment-  6/12 at 3 30 for 30 mis, video([phone]. will call home phone if video does not work)  - on lorazepam 2 mg once to twice a day as need for anxiety by primary care doctor - he is not interested in therapy at this time   Past trials of medication: sertraline (anxiety at 100 mg),  sinequan (doxepin), fluoxetine, lexapro (perceived side effect of nightmares), mirtazapine (nightmares), bupropion, Ambien, Trazodone,    The patient demonstrates the following risk factors for suicide: Chronic risk factors for suicide include: psychiatric disorder of depression and medical illness of COPD, thyroid cancer. Acute risk factors for suicide include: unemployment and loss (financial, interpersonal, professional). Protective factors for this patient include: positive social support, coping skills and hope for the future. Considering these factors, the overall suicide risk at this point appears to be low. Patient is appropriate for outpatient follow up.        Collaboration of Care: Collaboration of Care: Other reviewed notes in Epic  Patient/Guardian was advised Release of Information must be obtained prior to any record release in order to collaborate their care with an outside provider. Patient/Guardian  was advised if they have not already done so to contact the registration department to sign all necessary forms in order for [place] to release information regarding their care.   Consent: Patient/Guardian gives verbal consent for treatment and assignment of benefits for services provided during this visit. Patient/Guardian expressed understanding and agreed to proceed.    Neysa Hotter, MD 02/18/2023, 11:52 AM

## 2023-02-18 ENCOUNTER — Encounter: Payer: Self-pay | Admitting: Psychiatry

## 2023-02-18 ENCOUNTER — Telehealth (INDEPENDENT_AMBULATORY_CARE_PROVIDER_SITE_OTHER): Payer: Medicare HMO | Admitting: Psychiatry

## 2023-02-18 DIAGNOSIS — G4700 Insomnia, unspecified: Secondary | ICD-10-CM

## 2023-02-18 DIAGNOSIS — F3341 Major depressive disorder, recurrent, in partial remission: Secondary | ICD-10-CM

## 2023-02-18 DIAGNOSIS — F419 Anxiety disorder, unspecified: Secondary | ICD-10-CM | POA: Diagnosis not present

## 2023-02-18 MED ORDER — DOXEPIN HCL 10 MG PO CAPS: 20.0000 mg | ORAL_CAPSULE | Freq: Every day | ORAL | 1 refills | Status: DC

## 2023-03-20 NOTE — Progress Notes (Unsigned)
Virtual Visit via Video Note  I connected with Jose Burch on 03/23/23 at  3:30 PM EDT by a video enabled telemedicine application and verified that I am speaking with the correct person using two identifiers.  Location: Patient: home Provider: office Persons participated in the visit- patient, provider    I discussed the limitations of evaluation and management by telemedicine and the availability of in person appointments. The patient expressed understanding and agreed to proceed.    I discussed the assessment and treatment plan with the patient. The patient was provided an opportunity to ask questions and all were answered. The patient agreed with the plan and demonstrated an understanding of the instructions.   The patient was advised to call back or seek an in-person evaluation if the symptoms worsen or if the condition fails to improve as anticipated.  I provided 15 minutes of non-face-to-face time during this encounter.   Neysa Hotter, MD   [facility] MD/PA/NP OP Progress Note  03/23/2023 4:01 PM Jose Burch  MRN:  [id number]  Chief Complaint:  Chief Complaint  Patient presents with   Follow-up   HPI:  This is a follow-up appointment for depression, anxiety and insomnia.  He states that he will have cataract surgery.  He wants to get it over with.  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  He spends time, mowing the yard.  He thinks doxepin is too strong for him.  Although he cut down to 10 mg a day, he feels sleepy in the morning for a few hours, although it helps for sleep.  He denies any fall.  He denies any change in his medication.  He continues to take clonazepam every day for anxiety.  He believes his mood will be better after he has cataract surgery.  He denies SI.  He sleeps 6 hours. He reports good appetite. He denies any nightmares since the last visit. He denies alcohol use or drug use.  He is willing to try a lower dose of  doxepin.    Visit Diagnosis:    ICD-10-CM   1. MDD (major depressive disorder), recurrent, in partial remission (HCC)  F33.41     2. Anxiety disorder, unspecified type  F41.9     3. Insomnia, unspecified type  G47.00       Past Psychiatric History: Please see initial evaluation for full details. I have reviewed the history. No updates at this time.     Past Medical History:  Past Medical History:  Diagnosis Date   Asthma    Cancer (HCC)    Thyroid   COPD (chronic obstructive pulmonary disease) (HCC)    Hypertension    Insomnia    Thyroid disease    Tobacco abuse     Past Surgical History:  Procedure Laterality Date   skin cancer removed     THYROIDECTOMY  2015    Family Psychiatric History: Please see initial evaluation for full details. I have reviewed the history. No updates at this time.     Family History:  Family History  Problem Relation Age of Onset   Cancer Mother    Asthma Mother    Heart attack Sister    Allergic rhinitis Neg Hx    Eczema Neg Hx     Social History:  Social History   Socioeconomic History   Marital status: Married    Spouse name: Not on file   Number of children: Not on  file   Years of education: Not on file   Highest education level: Not on file  Occupational History   Not on file  Tobacco Use   Smoking status: Every Day    Packs/day: .5    Types: Cigarettes   Smokeless tobacco: Never  Vaping Use   Vaping Use: Never used  Substance and Sexual Activity   Alcohol use: No   Drug use: No   Sexual activity: Never    Partners: Female  Other Topics Concern   Not on file  Social History Narrative   Not on file   Social Determinants of Health   Financial Resource Strain: Not on file  Food Insecurity: Not on file  Transportation Needs: Not on file  Physical Activity: Not on file  Stress: Not on file  Social Connections: Not on file    Allergies:  Allergies  Allergen Reactions   Aspirin Hives    Metabolic  Disorder Labs: No results found for: "HGBA1C", "MPG" No results found for: "PROLACTIN" No results found for: "CHOL", "TRIG", "HDL", "CHOLHDL", "VLDL", "LDLCALC" Lab Results  Component Value Date   TSH 5.14 (H) 03/27/2020   TSH 0.158 (L) 05/05/2019    Therapeutic Level Labs: No results found for: "LITHIUM" No results found for: "VALPROATE" No results found for: "CBMZ"  Current Medications: Current Outpatient Medications  Medication Sig Dispense Refill   Doxepin HCl 6 MG TABS Take 1 tablet (6 mg total) by mouth at bedtime. 30 tablet 1   albuterol (VENTOLIN HFA) 108 (90 Base) MCG/ACT inhaler Inhale 2 puffs into the lungs every 6 (six) hours as needed for wheezing or shortness of breath. 18 g 1   amLODipine (NORVASC) 10 MG tablet Take 10 mg by mouth daily.     calcitRIOL (ROCALTROL) 0.5 MCG capsule TAKE 1 CAPSULE (0.5 MCG TOTAL) BY MOUTH DAILY. 90 capsule 0   calcium carbonate (TUMS EX) 750 MG chewable tablet Chew 1 tablet (750 mg total) by mouth 3 (three) times daily with meals. 180 tablet 3   diphenhydrAMINE (BENADRYL) 25 mg capsule Take 25 mg by mouth daily.     hydrochlorothiazide (HYDRODIURIL) 12.5 MG tablet Take 12.5 mg by mouth daily.     ipratropium-albuterol (DUONEB) 0.5-2.5 (3) MG/3ML SOLN Take 3 mLs by nebulization 2 (two) times daily. 180 mL 5   levothyroxine (SYNTHROID) 150 MCG tablet Take 150 mcg by mouth daily.     levothyroxine (SYNTHROID) 175 MCG tablet Take 1 tablet (175 mcg total) by mouth daily before breakfast. 90 tablet 1   LORazepam (ATIVAN) 2 MG tablet Take 2 mg by mouth 2 (two) times daily as needed.     losartan (COZAAR) 100 MG tablet Take 1 tablet (100 mg total) by mouth daily. 90 tablet 3   potassium chloride SA (KLOR-CON) 20 MEQ tablet Take 1 tablet (20 mEq total) by mouth daily. 90 tablet 3   prazosin (MINIPRESS) 1 MG capsule Take 1 capsule (1 mg total) by mouth at bedtime for 3 days. 3 capsule 0   prazosin (MINIPRESS) 2 MG capsule Take 1 capsule (2 mg total)  by mouth at bedtime. Start after completing 1 mg at night for 3 days 30 capsule 0   predniSONE (DELTASONE) 20 MG tablet Take 1 tablet (20 mg total) by mouth 2 (two) times daily with a meal. 6 tablet 0   Tiotropium Bromide Monohydrate (SPIRIVA RESPIMAT) 2.5 MCG/ACT AERS Inhale 1 spray into the lungs daily. 12 g 0   UNKNOWN TO PATIENT Allergy injection every  2 weeks     Current Facility-Administered Medications  Medication Dose Route Frequency Provider Last Rate Last Admin   Benralizumab SOSY 30 mg  30 mg Subcutaneous Q28 days Alfonse Spruce, MD   30 mg at 06/25/20 1041     Musculoskeletal: Strength & Muscle Tone:  N/A Gait & Station:  N/A Patient leans: N/A  Psychiatric Specialty Exam: Review of Systems  Psychiatric/Behavioral:  Positive for sleep disturbance. Negative for agitation, behavioral problems, confusion, decreased concentration, dysphoric mood, hallucinations, self-injury and suicidal ideas. The patient is nervous/anxious. The patient is not hyperactive.   All other systems reviewed and are negative.   There were no vitals taken for this visit.There is no height or weight on file to calculate BMI.  General Appearance: Fairly Groomed  Eye Contact:  Good  Speech:  Clear and Coherent  Volume:  Normal  Mood:   anxious  Affect:  Appropriate, Congruent, and fatigue  Thought Process:  Coherent  Orientation:  Full (Time, Place, and Person)  Thought Content: Logical   Suicidal Thoughts:  No  Homicidal Thoughts:  No  Memory:  Immediate;   Good  Judgement:  Good  Insight:  Good  Psychomotor Activity:  Normal  Concentration:  Concentration: Good and Attention Span: Good  Recall:  Good  Fund of Knowledge: Good  Language: Good  Akathisia:  No  Handed:  Right  AIMS (if indicated): not done  Assets:  Communication Skills Desire for Improvement  ADL's:  Intact  Cognition: WNL  Sleep:  Fair   Screenings: Equities trader Office Visit from 06/08/2021 in [facility] Office Visit from 02/13/2018 in [facility] Office Visit from 08/15/2017 in [facility] Office Visit from 02/10/2017 in [facility] Office Visit from 01/13/2017 in [facility]  PHQ-2 Total Score 3 0 0 0 0  PHQ-9 Total Score 9 -- -- -- --      Flowsheet Row ED from 05/08/2019 in [facility] Emergency Department at [facility]  C-SSRS RISK CATEGORY No Risk        Assessment and Plan:  Jose Burch is a 72 y.o. year old male with a history of depression, insomnia, malignant neoplasm of thyroid gland s/p thyroidectomy in 2015. ablation , postsurgical hypothyroidism, hypocalcemia, COPD, who presents for follow up appointment for below.   1. MDD (major depressive disorder), recurrent, in partial remission (HCC) 2. Anxiety disorder, unspecified type Acute stressors include: skin treatment, allergy, upcoming cataract surgery  Other stressors include: psoriasis, taking care of his wife with medical condition, loss of his ex wife, retirement, combat in [place]    History:  3. Insomnia, unspecified type - Although he has middle insomnia, daytime fatigue and snoring, he is not interested in pursuing sleep evaluation as he cannot wear a mask.  Although he reports mood and anxiety, it has been overall manageable since the last visit.  He has adverse reaction from doxepin, although he used to tolerate the current dose. Will lower the dose of doxepin to target depression, anxiety and insomnia and to mitigate risk of drowsiness.    4. Nicotine dependence, uncomplicated, unspecified nicotine product type He is not interested in pharmacological treatment at this time.    Plan Decrease doxepin, 6 mg at night - drowsiness from 10 mg, xerostomia from 75 mg at night Next appointment-  7/31 at 9 am for 30 mis,  [phone].  will call home phone if video does not work)  - on lorazepam 2 mg once to twice a day as need for anxiety by primary care doctor - he is not interested in therapy at this time   Past trials of medication: sertraline (anxiety at 100 mg),  sinequan (doxepin), fluoxetine, lexapro (perceived side effect of nightmares), mirtazapine (nightmares), bupropion, Ambien, Trazodone,    The patient demonstrates the following risk factors for suicide: Chronic risk factors for suicide include: psychiatric disorder of depression and medical illness of COPD, thyroid cancer. Acute risk factors for suicide include: unemployment and loss (financial, interpersonal, professional). Protective factors for this patient include: positive social support, coping skills and hope for the future. Considering these factors, the overall suicide risk at this point appears to be low. Patient is appropriate for outpatient follow up.    Collaboration of Care: Collaboration of Care: Other reviewed notes in Epic  Patient/Guardian was advised Release of Information must be obtained prior to any record release in order to collaborate their care with an outside provider. Patient/Guardian was advised if they have not already done so to contact the registration department to sign all necessary forms in order for [place] to release information regarding their care.   Consent: Patient/Guardian gives verbal consent for treatment and assignment of benefits for services provided during this visit. Patient/Guardian expressed understanding and agreed to proceed.    Neysa Hotter, MD 03/23/2023, 4:01 PM

## 2023-03-23 ENCOUNTER — Telehealth (INDEPENDENT_AMBULATORY_CARE_PROVIDER_SITE_OTHER): Payer: Medicare HMO | Admitting: Psychiatry

## 2023-03-23 ENCOUNTER — Encounter: Payer: Self-pay | Admitting: Psychiatry

## 2023-03-23 DIAGNOSIS — F419 Anxiety disorder, unspecified: Secondary | ICD-10-CM | POA: Diagnosis not present

## 2023-03-23 DIAGNOSIS — F3341 Major depressive disorder, recurrent, in partial remission: Secondary | ICD-10-CM

## 2023-03-23 DIAGNOSIS — G4700 Insomnia, unspecified: Secondary | ICD-10-CM

## 2023-03-23 MED ORDER — DOXEPIN HCL 6 MG PO TABS: 6.0000 mg | ORAL_TABLET | Freq: Every day | ORAL | 1 refills | Status: DC

## 2023-05-07 NOTE — Progress Notes (Deleted)
BH MD/PA/NP OP Progress Note  05/07/2023 4:13 PM Jose Burch  MRN:  [id number]  Chief Complaint: No chief complaint on file.  HPI: *** Visit Diagnosis: No diagnosis found.  Past Psychiatric History: Please see initial evaluation for full details. I have reviewed the history. No updates at this time.     Past Medical History:  Past Medical History:  Diagnosis Date   Asthma    Cancer (HCC)    Thyroid   COPD (chronic obstructive pulmonary disease) (HCC)    Hypertension    Insomnia    Thyroid disease    Tobacco abuse     Past Surgical History:  Procedure Laterality Date   skin cancer removed     THYROIDECTOMY  2015    Family Psychiatric History: Please see initial evaluation for full details. I have reviewed the history. No updates at this time.     Family History:  Family History  Problem Relation Age of Onset   Cancer Mother    Asthma Mother    Heart attack Sister    Allergic rhinitis Neg Hx    Eczema Neg Hx     Social History:  Social History   Socioeconomic History   Marital status: Married    Spouse name: Not on file   Number of children: Not on file   Years of education: Not on file   Highest education level: Not on file  Occupational History   Not on file  Tobacco Use   Smoking status: Every Day    Current packs/day: 0.50    Types: Cigarettes   Smokeless tobacco: Never  Vaping Use   Vaping status: Never Used  Substance and Sexual Activity   Alcohol use: No   Drug use: No   Sexual activity: Never    Partners: Female  Other Topics Concern   Not on file  Social History Narrative   Not on file   Social Determinants of Health   Financial Resource Strain: Not on file  Food Insecurity: Not on file  Transportation Needs: Not on file  Physical Activity: Not on file  Stress: Not on file  Social Connections: Not on file    Allergies:  Allergies  Allergen Reactions   Aspirin Hives    Metabolic Disorder Labs: No results found for:  "HGBA1C", "MPG" No results found for: "PROLACTIN" No results found for: "CHOL", "TRIG", "HDL", "CHOLHDL", "VLDL", "LDLCALC" Lab Results  Component Value Date   TSH 5.14 (H) 03/27/2020   TSH 0.158 (L) 05/05/2019    Therapeutic Level Labs: No results found for: "LITHIUM" No results found for: "VALPROATE" No results found for: "CBMZ"  Current Medications: Current Outpatient Medications  Medication Sig Dispense Refill   albuterol (VENTOLIN HFA) 108 (90 Base) MCG/ACT inhaler Inhale 2 puffs into the lungs every 6 (six) hours as needed for wheezing or shortness of breath. 18 g 1   amLODipine (NORVASC) 10 MG tablet Take 10 mg by mouth daily.     calcitRIOL (ROCALTROL) 0.5 MCG capsule TAKE 1 CAPSULE (0.5 MCG TOTAL) BY MOUTH DAILY. 90 capsule 0   calcium carbonate (TUMS EX) 750 MG chewable tablet Chew 1 tablet (750 mg total) by mouth 3 (three) times daily with meals. 180 tablet 3   diphenhydrAMINE (BENADRYL) 25 mg capsule Take 25 mg by mouth daily.     Doxepin HCl 6 MG TABS Take 1 tablet (6 mg total) by mouth at bedtime. 30 tablet 1   hydrochlorothiazide (HYDRODIURIL) 12.5 MG tablet Take 12.5 mg  by mouth daily.     ipratropium-albuterol (DUONEB) 0.5-2.5 (3) MG/3ML SOLN Take 3 mLs by nebulization 2 (two) times daily. 180 mL 5   levothyroxine (SYNTHROID) 150 MCG tablet Take 150 mcg by mouth daily.     levothyroxine (SYNTHROID) 175 MCG tablet Take 1 tablet (175 mcg total) by mouth daily before breakfast. 90 tablet 1   LORazepam (ATIVAN) 2 MG tablet Take 2 mg by mouth 2 (two) times daily as needed.     losartan (COZAAR) 100 MG tablet Take 1 tablet (100 mg total) by mouth daily. 90 tablet 3   potassium chloride SA (KLOR-CON) 20 MEQ tablet Take 1 tablet (20 mEq total) by mouth daily. 90 tablet 3   prazosin (MINIPRESS) 1 MG capsule Take 1 capsule (1 mg total) by mouth at bedtime for 3 days. 3 capsule 0   prazosin (MINIPRESS) 2 MG capsule Take 1 capsule (2 mg total) by mouth at bedtime. Start after  completing 1 mg at night for 3 days 30 capsule 0   predniSONE (DELTASONE) 20 MG tablet Take 1 tablet (20 mg total) by mouth 2 (two) times daily with a meal. 6 tablet 0   Tiotropium Bromide Monohydrate (SPIRIVA RESPIMAT) 2.5 MCG/ACT AERS Inhale 1 spray into the lungs daily. 12 g 0   UNKNOWN TO PATIENT Allergy injection every 2 weeks     Current Facility-Administered Medications  Medication Dose Route Frequency Provider Last Rate Last Admin   Benralizumab SOSY 30 mg  30 mg Subcutaneous Q28 days Alfonse Spruce, MD   30 mg at 06/25/20 1041     Musculoskeletal: Strength & Muscle Tone:  N/A Gait & Station:  N/A Patient leans: N/A  Psychiatric Specialty Exam: Review of Systems  There were no vitals taken for this visit.There is no height or weight on file to calculate BMI.  General Appearance: {Appearance:22683}  Eye Contact:  {BHH EYE CONTACT:22684}  Speech:  Clear and Coherent  Volume:  Normal  Mood:  {BHH MOOD:22306}  Affect:  {Affect (PAA):22687}  Thought Process:  Coherent  Orientation:  Full (Time, Place, and Person)  Thought Content: Logical   Suicidal Thoughts:  {ST/HT (PAA):22692}  Homicidal Thoughts:  {ST/HT (PAA):22692}  Memory:  Immediate;   Good  Judgement:  {Judgement (PAA):22694}  Insight:  {Insight (PAA):22695}  Psychomotor Activity:  Normal  Concentration:  Concentration: Good and Attention Span: Good  Recall:  Good  Fund of Knowledge: Good  Language: Good  Akathisia:  No  Handed:  Right  AIMS (if indicated): not done  Assets:  Communication Skills Desire for Improvement  ADL's:  Intact  Cognition: WNL  Sleep:  {BHH GOOD/FAIR/POOR:22877}   Screenings: [employer] [facility] Office Visit from 06/08/2021 in [facility] Office Visit from 02/13/2018 in [facility] Office Visit from 08/15/2017 in [facility] Office Visit from 02/10/2017 in [facility] Office Visit from 01/13/2017 in [facility]  PHQ-2 Total Score 3 0 0 0 0  PHQ-9 Total Score 9 -- -- -- --      Flowsheet Row ED from 05/08/2019 in [facility] Department at [facility]  C-SSRS RISK CATEGORY No Risk        Assessment and Plan:  BRYLIN HANAUER is a 72 y.o. year old male with a history of depression, insomnia, malignant neoplasm of thyroid gland s/p thyroidectomy in 2015. ablation , postsurgical hypothyroidism, hypocalcemia, COPD, who  presents for follow up appointment for below.    1. MDD (major depressive disorder), recurrent, in partial remission (HCC) 2. Anxiety disorder, unspecified type Acute stressors include: skin treatment, allergy, upcoming cataract surgery  Other stressors include: psoriasis, taking care of his wife with medical condition, loss of his ex wife, retirement, combat in [place]    History:  3. Insomnia, unspecified type - Although he has middle insomnia, daytime fatigue and snoring, he is not interested in pursuing sleep evaluation as he cannot wear a mask.  Although he reports mood and anxiety, it has been overall manageable since the last visit.  He has adverse reaction from doxepin, although he used to tolerate the current dose. Will lower the dose of doxepin to target depression, anxiety and insomnia and to mitigate risk of drowsiness.    4. Nicotine dependence, uncomplicated, unspecified nicotine product type He is not interested in pharmacological treatment at this time.    Plan Decrease doxepin, 6 mg at night - drowsiness from 10 mg, xerostomia from 75 mg at night Next appointment-  7/31 at 9 am for 30 mis, video([phone]. will call home phone if video does not work)  - on lorazepam 2 mg once to twice a day as need for anxiety by primary care doctor - he is not interested in therapy at this time   Past trials of medication: sertraline (anxiety  at 100 mg),  sinequan (doxepin), fluoxetine, lexapro (perceived side effect of nightmares), mirtazapine (nightmares), bupropion, Ambien, Trazodone,    The patient demonstrates the following risk factors for suicide: Chronic risk factors for suicide include: psychiatric disorder of depression and medical illness of COPD, thyroid cancer. Acute risk factors for suicide include: unemployment and loss (financial, interpersonal, professional). Protective factors for this patient include: positive social support, coping skills and hope for the future. Considering these factors, the overall suicide risk at this point appears to be low. Patient is appropriate for outpatient follow up.    Collaboration of Care: Collaboration of Care: {BH OP Collaboration of Care:21014065}  Patient/Guardian was advised Release of Information must be obtained prior to any record release in order to collaborate their care with an outside provider. Patient/Guardian was advised if they have not already done so to contact the registration department to sign all necessary forms in order for [place] to release information regarding their care.   Consent: Patient/Guardian gives verbal consent for treatment and assignment of benefits for services provided during this visit. Patient/Guardian expressed understanding and agreed to proceed.    Neysa Hotter, MD 05/07/2023, 4:13 PM

## 2023-05-11 ENCOUNTER — Telehealth: Payer: Medicare HMO | Admitting: Psychiatry
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
## 2023-05-16 ENCOUNTER — Telehealth: Payer: Medicare HMO | Admitting: Psychiatry

## 2023-05-16 ENCOUNTER — Encounter: Payer: Self-pay | Admitting: Psychiatry

## 2023-05-16 DIAGNOSIS — F3341 Major depressive disorder, recurrent, in partial remission: Secondary | ICD-10-CM | POA: Diagnosis not present

## 2023-05-16 DIAGNOSIS — G4700 Insomnia, unspecified: Secondary | ICD-10-CM

## 2023-05-16 DIAGNOSIS — F419 Anxiety disorder, unspecified: Secondary | ICD-10-CM | POA: Diagnosis not present

## 2023-05-16 DIAGNOSIS — F4310 Post-traumatic stress disorder, unspecified: Secondary | ICD-10-CM

## 2023-05-16 MED ORDER — DOXEPIN HCL 6 MG PO TABS: 6.0000 mg | ORAL_TABLET | Freq: Every day | ORAL | 1 refills | Status: DC

## 2023-05-16 NOTE — Patient Instructions (Signed)
Continue doxepin, 6 mg at night  Next appointment-  10/2 at 9 30
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
## 2023-07-13 ENCOUNTER — Encounter: Payer: Self-pay | Admitting: Psychiatry

## 2023-07-13 ENCOUNTER — Telehealth (INDEPENDENT_AMBULATORY_CARE_PROVIDER_SITE_OTHER): Payer: Medicare HMO | Admitting: Psychiatry

## 2023-07-13 DIAGNOSIS — F419 Anxiety disorder, unspecified: Secondary | ICD-10-CM | POA: Diagnosis not present

## 2023-07-13 DIAGNOSIS — F3341 Major depressive disorder, recurrent, in partial remission: Secondary | ICD-10-CM | POA: Diagnosis not present

## 2023-07-13 DIAGNOSIS — G4700 Insomnia, unspecified: Secondary | ICD-10-CM

## 2023-07-13 DIAGNOSIS — F4310 Post-traumatic stress disorder, unspecified: Secondary | ICD-10-CM

## 2023-07-13 MED ORDER — PRAZOSIN HCL 1 MG PO CAPS: 1.0000 mg | ORAL_CAPSULE | Freq: Every day | ORAL | 0 refills | Status: AC

## 2023-07-13 MED ORDER — DOXEPIN HCL 6 MG PO TABS: 6.0000 mg | ORAL_TABLET | Freq: Every day | ORAL | 1 refills | Status: DC

## 2023-07-13 MED ORDER — PRAZOSIN HCL 2 MG PO CAPS: 2.0000 mg | ORAL_CAPSULE | Freq: Every day | ORAL | 1 refills | Status: AC

## 2023-07-13 NOTE — Patient Instructions (Addendum)
Continue doxepin, 6 mg at night  Start prazosin 1 mg at night for 3 days, then 2 mg at night  Next appointment-  11/27 at 1 20

## 2023-09-04 NOTE — Progress Notes (Unsigned)
Virtual Visit via Video Note  I connected with Jose Burch on 09/07/23 at  1:20 PM EST by a video enabled telemedicine application and verified that I am speaking with the correct person using two identifiers.  Location: Patient: home Provider: office Persons participated in the visit- patient, provider    I discussed the limitations of evaluation and management by telemedicine and the availability of in person appointments. The patient expressed understanding and agreed to proceed.  I discussed the assessment and treatment plan with the patient. The patient was provided an opportunity to ask questions and all were answered. The patient agreed with the plan and demonstrated an understanding of the instructions.   The patient was advised to call back or seek an in-person evaluation if the symptoms worsen or if the condition fails to improve as anticipated.  I provided 25 minutes of non-face-to-face time during this encounter.   Neysa Hotter, MD      [facility] MD/PA/NP OP Progress Note  09/07/2023 3:07 PM Jose Burch  MRN:  [id number]  Chief Complaint:  Chief Complaint  Patient presents with   Follow-up   HPI:  This is a follow-up appointment for PTSD, depression, anxiety and insomnia.  He states that he is ready for Thanksgiving.  He will have his children and grandchildren.  His wife's daughter is helping for cooking.  He states that nightmares have gone away, although it used to occur very frequently.  He has not had it since the last visit.  He continues to think about the time, although he would like to forget.  He states that he has seen so much death, and he does not want to see it anymore.  He states that his wife's family of 7 year old man died in war.  He states that some people have to pay the price whether it is right or wrong. It is to fight or die. He made it, although he had a lot of good friends, who did not make it.  He feels guilty.  He states that he owes them to  life.  He tries to set example for young people. He states that people never stop fighting, referring to the war gong on in th war.  He is willing to try prazosin if he experiences any worsening in nightmares.  He sleeps up to 4 hours. He denies feeling down. He has fair energy and appetite.  He denies drowsiness or fall.  He feels anxious at times.  He denies SI.    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 Divorced before (his ex-wife died in 09/28/2022  Visit Diagnosis:    ICD-10-CM   1. PTSD (post-traumatic stress disorder)  F43.10     2. MDD (major depressive disorder), recurrent, in partial remission (HCC)  F33.41     3. Anxiety disorder, unspecified type  F41.9     4. Insomnia, unspecified type  G47.00       Past Psychiatric History: Please see initial evaluation for full details. I have reviewed the history. No updates at this time.  Past Medical History:  Past Medical History:  Diagnosis Date   Asthma    Cancer (HCC)    Thyroid   COPD (chronic obstructive pulmonary disease) (HCC)    Hypertension    Insomnia    Thyroid disease    Tobacco abuse     Past Surgical History:  Procedure Laterality Date   skin cancer removed     THYROIDECTOMY  2015    Family Psychiatric History: Please see initial evaluation for full details. I have reviewed the history. No updates at this time.     Family History:  Family History  Problem Relation Age of Onset   Cancer Mother    Asthma Mother    Heart attack Sister    Allergic rhinitis Neg Hx    Eczema Neg Hx     Social History:  Social History   Socioeconomic History   Marital status:  Married    Spouse name: Not on file   Number of children: Not on file   Years of education: Not on file   Highest education level: Not on file  Occupational History   Not on file  Tobacco Use   Smoking status: Every Day    Current packs/day: 0.50    Types: Cigarettes   Smokeless tobacco: Never  Vaping Use   Vaping status: Never Used  Substance and Sexual Activity   Alcohol use: No   Drug use: No   Sexual activity: Never    Partners: Female  Other Topics Concern   Not on file  Social History Narrative   Not on file   Social Determinants of Health   Financial Resource Strain: Not on file  Food Insecurity: Not on file  Transportation Needs: Not on file  Physical Activity: Not on file  Stress: Not on file  Social Connections: Not on file    Allergies:  Allergies  Allergen Reactions   Aspirin Hives    Metabolic Disorder Labs: No results found for: "HGBA1C", "MPG" No results found for: "PROLACTIN" No results found for: "CHOL", "TRIG", "HDL", "CHOLHDL", "VLDL", "LDLCALC" Lab Results  Component Value Date   TSH 5.14 (H) 03/27/2020   TSH 0.158 (L) 05/05/2019    Therapeutic Level Labs: No results found for: "LITHIUM" No results found for: "VALPROATE" No results found for: "CBMZ"  Current Medications: Current Outpatient Medications  Medication Sig Dispense Refill   albuterol (VENTOLIN HFA) 108 (90 Base) MCG/ACT inhaler Inhale 2 puffs into the lungs every 6 (six) hours as needed for wheezing or shortness of breath. 18 g 1   amLODipine (NORVASC) 10 MG tablet Take 10 mg by mouth daily.     calcitRIOL (ROCALTROL) 0.5 MCG capsule TAKE 1 CAPSULE (0.5 MCG TOTAL) BY MOUTH DAILY. 90 capsule 0   calcium carbonate (TUMS EX) 750 MG chewable tablet Chew 1 tablet (750 mg total) by mouth 3 (three) times daily with meals. 180 tablet 3   diphenhydrAMINE (BENADRYL) 25 mg capsule Take 25 mg by mouth daily.     [START ON 09/19/2023] Doxepin HCl 6 MG TABS Take 1 tablet (6 mg total) by  mouth at bedtime. 30 tablet 2   hydrochlorothiazide (HYDRODIURIL) 12.5 MG tablet Take 12.5 mg by mouth daily.     ipratropium-albuterol (DUONEB) 0.5-2.5 (3) MG/3ML SOLN Take 3 mLs by nebulization 2 (two) times daily. 180 mL 5   levothyroxine (SYNTHROID) 150 MCG tablet Take 150 mcg by mouth daily.     levothyroxine (SYNTHROID) 175 MCG tablet Take 1 tablet (175 mcg total)  by mouth daily before breakfast. 90 tablet 1   LORazepam (ATIVAN) 2 MG tablet Take 2 mg by mouth 2 (two) times daily as needed.     losartan (COZAAR) 100 MG tablet Take 1 tablet (100 mg total) by mouth daily. 90 tablet 3   potassium chloride SA (KLOR-CON) 20 MEQ tablet Take 1 tablet (20 mEq total) by mouth daily. 90 tablet 3   prazosin (MINIPRESS) 1 MG capsule Take 1 capsule (1 mg total) by mouth at bedtime for 3 days. 3 capsule 0   prazosin (MINIPRESS) 2 MG capsule Take 1 capsule (2 mg total) by mouth at bedtime. Start after completing 1 mg at night for 3 days 30 capsule 1   predniSONE (DELTASONE) 20 MG tablet Take 1 tablet (20 mg total) by mouth 2 (two) times daily with a meal. 6 tablet 0   Tiotropium Bromide Monohydrate (SPIRIVA RESPIMAT) 2.5 MCG/ACT AERS Inhale 1 spray into the lungs daily. 12 g 0   UNKNOWN TO PATIENT Allergy injection every 2 weeks     Current Facility-Administered Medications  Medication Dose Route Frequency Provider Last Rate Last Admin   Benralizumab SOSY 30 mg  30 mg Subcutaneous Q28 days Alfonse Spruce, MD   30 mg at 06/25/20 1041     Musculoskeletal: Strength & Muscle Tone:  N/A Gait & Station:  N/A Patient leans: N/A  Psychiatric Specialty Exam: Review of Systems  Psychiatric/Behavioral:  Positive for sleep disturbance. Negative for agitation, behavioral problems, confusion, decreased concentration, dysphoric mood, hallucinations, self-injury and suicidal ideas. The patient is nervous/anxious. The patient is not hyperactive.   All other systems reviewed and are negative.   There were  no vitals taken for this visit.There is no height or weight on file to calculate BMI.  General Appearance: Well Groomed  Eye Contact:  Good  Speech:  Clear and Coherent  Volume:  Normal  Mood:   good  Affect:  Appropriate, Congruent, and calm  Thought Process:  Coherent  Orientation:  Full (Time, Place, and Person)  Thought Content: Logical   Suicidal Thoughts:  No  Homicidal Thoughts:  No  Memory:  Immediate;   Good  Judgement:  Good  Insight:  Good  Psychomotor Activity:  Normal  Concentration:  Concentration: Good and Attention Span: Good  Recall:  Good  Fund of Knowledge: Good  Language: Good  Akathisia:  No  Handed:  Right  AIMS (if indicated): not done  Assets:  Communication Skills Desire for Improvement  ADL's:  Intact  Cognition: WNL  Sleep:  Fair   Screenings: Equities trader Office Visit from 06/08/2021 in [facility] Office Visit from 02/13/2018 in [facility] Office Visit from 08/15/2017 in [facility] Office Visit from 02/10/2017 in [facility] Office Visit from 01/13/2017 in [facility]  PHQ-2 Total Score 3 0 0 0 0  PHQ-9 Total Score 9 -- -- -- --      Flowsheet Row ED from 05/08/2019 in [facility] Emergency Department at [facility]  C-SSRS RISK CATEGORY No Risk        Assessment and Plan:  Jose Burch is a 72 y.o. year old male with a history of depression, insomnia, malignant neoplasm of thyroid gland s/p thyroidectomy in 2015. ablation , postsurgical hypothyroidism, hypocalcemia, COPD, who presents for follow up appointment for below.    1. PTSD (post-traumatic stress disorder) 2. MDD (  major depressive disorder), recurrent, in partial remission (HCC) 3. Anxiety disorder, unspecified type Acute stressors include: skin treatment, allergy, upcoming cataract  surgery  Other stressors include: psoriasis, taking care of his wife with medical condition, loss of his ex wife, retirement, combat in [place]    History:  There has been improvement in nightmares, although he continues to experience intrusive thoughts, and anxiety.  Although he has not tried the prazosin yet, he is willing to try this if he experiences another nightmares. Medication is sent to the pharmacy.    4. Insomnia, unspecified type  - Although he has middle insomnia, daytime fatigue and snoring, he is not interested in pursuing sleep evaluation as he cannot wear a mask. He sleeps up to 4 hours, and reports some benefit from doxepin.  Will continue current dose to target insomnia while monitoring any drowsiness.     4. Nicotine dependence, uncomplicated, unspecified nicotine product type Unchanged. He is not interested in pharmacological treatment at this time.    Plan Continue doxepin, 6 mg at night - drowsiness from 10 mg Start prazosin 1 mg at night for 3 days, then 2 mg at night  Next appointment-  2/19 at 3 pm, video [phone]. will call home phone if video does not work)  - on lorazepam 2 mg once to twice a day as need for anxiety by primary care doctor - on benadryl 25 mg daily prn for itchiness - he is not interested in therapy at this time   Past trials of medication: sertraline (anxiety at 100 mg),  sinequan (doxepin), fluoxetine, lexapro (perceived side effect of nightmares), mirtazapine (nightmares), bupropion, Ambien, Trazodone,    The patient demonstrates the following risk factors for suicide: Chronic risk factors for suicide include: psychiatric disorder of depression and medical illness of COPD, thyroid cancer. Acute risk factors for suicide include: unemployment and loss (financial, interpersonal, professional). Protective factors for this patient include: positive social support, coping skills and hope for the future. Considering these factors, the overall  suicide risk at this point appears to be low. Patient is appropriate for outpatient follow up.    Collaboration of Care: Collaboration of Care: Other reviewed notes in Epic  Patient/Guardian was advised Release of Information must be obtained prior to any record release in order to collaborate their care with an outside provider. Patient/Guardian was advised if they have not already done so to contact the registration department to sign all necessary forms in order for [place] to release information regarding their care.   Consent: Patient/Guardian gives verbal consent for treatment and assignment of benefits for services provided during this visit. Patient/Guardian expressed understanding and agreed to proceed.    Neysa Hotter, MD 09/07/2023, 3:07 PM

## 2023-09-07 ENCOUNTER — Telehealth: Payer: Medicare HMO | Admitting: Psychiatry

## 2023-09-07 ENCOUNTER — Encounter: Payer: Self-pay | Admitting: Psychiatry

## 2023-09-07 DIAGNOSIS — F4310 Post-traumatic stress disorder, unspecified: Secondary | ICD-10-CM | POA: Diagnosis not present

## 2023-09-07 DIAGNOSIS — F3341 Major depressive disorder, recurrent, in partial remission: Secondary | ICD-10-CM

## 2023-09-07 DIAGNOSIS — G4700 Insomnia, unspecified: Secondary | ICD-10-CM | POA: Diagnosis not present

## 2023-09-07 DIAGNOSIS — F419 Anxiety disorder, unspecified: Secondary | ICD-10-CM

## 2023-09-07 MED ORDER — DOXEPIN HCL 6 MG PO TABS: 6.0000 mg | ORAL_TABLET | Freq: Every day | ORAL | 2 refills | Status: AC

## 2023-09-07 NOTE — Patient Instructions (Signed)
Continue doxepin, 6 mg at night  Start prazosin 1 mg at night for 3 days, then 2 mg at night  Next appointment-  2/19 at 3 pm

## 2023-11-26 NOTE — Progress Notes (Deleted)
 [facility] MD/PA/NP OP Progress Note  11/26/2023 9:17 AM Jose Burch  MRN:  [id number]  Chief Complaint: No chief complaint on file.  HPI: *** Visit Diagnosis: No diagnosis found.  Past Psychiatric History: Please see initial evaluation for full details. I have reviewed the history. No updates at this time.     Past Medical History:  Past Medical History:  Diagnosis Date   Asthma    Cancer (HCC)    Thyroid   COPD (chronic obstructive pulmonary disease) (HCC)    Hypertension    Insomnia    Thyroid disease    Tobacco abuse     Past Surgical History:  Procedure Laterality Date   skin cancer removed     THYROIDECTOMY  2015    Family Psychiatric History: Please see initial evaluation for full details. I have reviewed the history. No updates at this time.     Family History:  Family History  Problem Relation Age of Onset   Cancer Mother    Asthma Mother    Heart attack Sister    Allergic rhinitis Neg Hx    Eczema Neg Hx     Social History:  Social History   Socioeconomic History   Marital status: Married    Spouse name: Not on file   Number of children: Not on file   Years of education: Not on file   Highest education level: Not on file  Occupational History   Not on file  Tobacco Use   Smoking status: Every Day    Current packs/day: 0.50    Types: Cigarettes   Smokeless tobacco: Never  Vaping Use   Vaping status: Never Used  Substance and Sexual Activity   Alcohol use: No   Drug use: No   Sexual activity: Never    Partners: Female  Other Topics Concern   Not on file  Social History Narrative   Not on file   Social Drivers of Health   Financial Resource Strain: Not on file  Food Insecurity: Not on file  Transportation Needs: Not on file  Physical Activity: Not on file  Stress: Not on file  Social Connections: Not on file    Allergies:  Allergies  Allergen Reactions   Aspirin Hives    Metabolic Disorder Labs: No results found for: "HGBA1C",  "MPG" No results found for: "PROLACTIN" No results found for: "CHOL", "TRIG", "HDL", "CHOLHDL", "VLDL", "LDLCALC" Lab Results  Component Value Date   TSH 5.14 (H) 03/27/2020   TSH 0.158 (L) 05/05/2019    Therapeutic Level Labs: No results found for: "LITHIUM" No results found for: "VALPROATE" No results found for: "CBMZ"  Current Medications: Current Outpatient Medications  Medication Sig Dispense Refill   albuterol (VENTOLIN HFA) 108 (90 Base) MCG/ACT inhaler Inhale 2 puffs into the lungs every 6 (six) hours as needed for wheezing or shortness of breath. 18 g 1   amLODipine (NORVASC) 10 MG tablet Take 10 mg by mouth daily.     calcitRIOL (ROCALTROL) 0.5 MCG capsule TAKE 1 CAPSULE (0.5 MCG TOTAL) BY MOUTH DAILY. 90 capsule 0   calcium carbonate (TUMS EX) 750 MG chewable tablet Chew 1 tablet (750 mg total) by mouth 3 (three) times daily with meals. 180 tablet 3   diphenhydrAMINE (BENADRYL) 25 mg capsule Take 25 mg by mouth daily.     Doxepin HCl 6 MG TABS Take 1 tablet (6 mg total) by mouth at bedtime. 30 tablet 2   hydrochlorothiazide (HYDRODIURIL) 12.5 MG tablet Take 12.5 mg  by mouth daily.     ipratropium-albuterol (DUONEB) 0.5-2.5 (3) MG/3ML SOLN Take 3 mLs by nebulization 2 (two) times daily. 180 mL 5   levothyroxine (SYNTHROID) 150 MCG tablet Take 150 mcg by mouth daily.     levothyroxine (SYNTHROID) 175 MCG tablet Take 1 tablet (175 mcg total) by mouth daily before breakfast. 90 tablet 1   LORazepam (ATIVAN) 2 MG tablet Take 2 mg by mouth 2 (two) times daily as needed.     losartan (COZAAR) 100 MG tablet Take 1 tablet (100 mg total) by mouth daily. 90 tablet 3   potassium chloride SA (KLOR-CON) 20 MEQ tablet Take 1 tablet (20 mEq total) by mouth daily. 90 tablet 3   prazosin (MINIPRESS) 1 MG capsule Take 1 capsule (1 mg total) by mouth at bedtime for 3 days. 3 capsule 0   prazosin (MINIPRESS) 2 MG capsule Take 1 capsule (2 mg total) by mouth at bedtime. Start after completing 1  mg at night for 3 days 30 capsule 1   predniSONE (DELTASONE) 20 MG tablet Take 1 tablet (20 mg total) by mouth 2 (two) times daily with a meal. 6 tablet 0   Tiotropium Bromide Monohydrate (SPIRIVA RESPIMAT) 2.5 MCG/ACT AERS Inhale 1 spray into the lungs daily. 12 g 0   UNKNOWN TO PATIENT Allergy injection every 2 weeks     Current Facility-Administered Medications  Medication Dose Route Frequency Provider Last Rate Last Admin   Benralizumab SOSY 30 mg  30 mg Subcutaneous Q28 days Alfonse Spruce, MD   30 mg at 06/25/20 1041     Musculoskeletal: Strength & Muscle Tone:  N/A Gait & Station:  N/A Patient leans: N/A  Psychiatric Specialty Exam: Review of Systems  There were no vitals taken for this visit.There is no height or weight on file to calculate BMI.  General Appearance: {Appearance:22683}  Eye Contact:  {B[facility] EYE CONTACT:22684}  Speech:  Clear and Coherent  Volume:  Normal  Mood:  {BHH MOOD:22306}  Affect:  {Affect (PAA):22687}  Thought Process:  Coherent  Orientation:  Full (Time, Place, and Person)  Thought Content: Logical   Suicidal Thoughts:  {ST/HT (PAA):22692}  Homicidal Thoughts:  {ST/HT (PAA):22692}  Memory:  Immediate;   Good  Judgement:  {Judgement (PAA):22694}  Insight:  {Insight (PAA):22695}  Psychomotor Activity:  Normal  Concentration:  Concentration: Good and Attention Span: Good  Recall:  Good  Fund of Knowledge: Good  Language: Good  Akathisia:  No  Handed:  Right  AIMS (if indicated): not done  Assets:  Communication Skills Desire for Improvement  ADL's:  Intact  Cognition: WNL  Sleep:  {B[facility] GOOD/FAIR/POOR:22877}   Screenings: [employer] Office Visit from 06/08/2021 in [facility] Office Visit from 02/13/2018 in [facility] Office Visit from 08/15/2017 in [facility] Office Visit from 02/10/2017 in [facility] Office Visit from 01/13/2017 in [facility]  PHQ-2 Total Score 3 0 0 0 0  PHQ-9 Total Score 9 -- -- -- --      Flowsheet Row ED from 05/08/2019 in [facility]  C-SSRS RISK CATEGORY No Risk        Assessment and Plan:  Jose Burch is a 73 y.o. year old male with a history of depression, insomnia, malignant neoplasm of thyroid gland s/p thyroidectomy in 2015. ablation , postsurgical hypothyroidism, hypocalcemia, COPD, who  presents for follow up appointment for below.    1. PTSD (post-traumatic stress disorder) 2. MDD (major depressive disorder), recurrent, in partial remission (HCC) 3. Anxiety disorder, unspecified type Acute stressors include: skin treatment, allergy, upcoming cataract surgery  Other stressors include: psoriasis, taking care of his wife with medical condition, loss of his ex wife, retirement, combat in [place]    History:  There has been improvement in nightmares, although he continues to experience intrusive thoughts, and anxiety.  Although he has not tried the prazosin yet, he is willing to try this if he experiences another nightmares. Medication is sent to the pharmacy.     4. Insomnia, unspecified type  - Although he has middle insomnia, daytime fatigue and snoring, he is not interested in pursuing sleep evaluation as he cannot wear a mask. He sleeps up to 4 hours, and reports some benefit from doxepin.  Will continue current dose to target insomnia while monitoring any drowsiness.     4. Nicotine dependence, uncomplicated, unspecified nicotine product type Unchanged. He is not interested in pharmacological treatment at this time.    Plan Continue doxepin, 6 mg at night - drowsiness from 10 mg Start prazosin 1 mg at night for 3 days, then 2 mg at night  Next appointment-  2/19 at 3 pm, video ([phone]. will call home phone if video does not work)  - on  lorazepam 2 mg once to twice a day as need for anxiety by primary care doctor - on benadryl 25 mg daily prn for itchiness - he is not interested in therapy at this time   Past trials of medication: sertraline (anxiety at 100 mg),  sinequan (doxepin), fluoxetine, lexapro (perceived side effect of nightmares), mirtazapine (nightmares), bupropion, Ambien, Trazodone,    The patient demonstrates the following risk factors for suicide: Chronic risk factors for suicide include: psychiatric disorder of depression and medical illness of COPD, thyroid cancer. Acute risk factors for suicide include: unemployment and loss (financial, interpersonal, professional). Protective factors for this patient include: positive social support, coping skills and hope for the future. Considering these factors, the overall suicide risk at this point appears to be low. Patient is appropriate for outpatient follow up.    Collaboration of Care: Collaboration of Care: {BH OP Collaboration of Care:[id number]}  Patient/Guardian was advised Release of Information must be obtained prior to any record release in order to collaborate their care with an outside provider. Patient/Guardian was advised if they have not already done so to contact the registration department to sign all necessary forms in order for [place] to release information regarding their care.   Consent: Patient/Guardian gives verbal consent for treatment and assignment of benefits for services provided during this visit. Patient/Guardian expressed understanding and agreed to proceed.    Neysa Hotter, MD 11/26/2023, 9:17 AM

## 2023-11-30 ENCOUNTER — Telehealth: Payer: Medicare HMO | Admitting: Psychiatry
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
## 2024-02-01 ENCOUNTER — Encounter: Payer: Medicare HMO | Admitting: Psychiatry

## 2024-05-11 DEATH — deceased
